# Patient Record
Sex: Male | Born: 1955 | Race: Black or African American | Hispanic: No | Marital: Married | Smoking: Never smoker
Health system: Southern US, Community
[De-identification: ages and names within clinical notes are randomized; demographics above are authoritative.]

## PROBLEM LIST (undated history)

## (undated) DIAGNOSIS — E119 Type 2 diabetes mellitus without complications: Secondary | ICD-10-CM

## (undated) DIAGNOSIS — K219 Gastro-esophageal reflux disease without esophagitis: Secondary | ICD-10-CM

## (undated) DIAGNOSIS — I639 Cerebral infarction, unspecified: Secondary | ICD-10-CM

## (undated) DIAGNOSIS — Z87442 Personal history of urinary calculi: Secondary | ICD-10-CM

## (undated) DIAGNOSIS — B019 Varicella without complication: Secondary | ICD-10-CM

## (undated) DIAGNOSIS — N529 Male erectile dysfunction, unspecified: Secondary | ICD-10-CM

## (undated) DIAGNOSIS — I10 Essential (primary) hypertension: Secondary | ICD-10-CM

## (undated) DIAGNOSIS — E7800 Pure hypercholesterolemia, unspecified: Secondary | ICD-10-CM

## (undated) HISTORY — DX: Varicella without complication: B01.9

## (undated) HISTORY — PX: WRIST FUSION: SHX839

## (undated) HISTORY — PX: SHOULDER SURGERY: SHX246

## (undated) HISTORY — PX: CLAVICLE SURGERY: SHX598

## (undated) HISTORY — DX: Type 2 diabetes mellitus without complications: E11.9

---

## 2001-09-29 ENCOUNTER — Ambulatory Visit (HOSPITAL_BASED_OUTPATIENT_CLINIC_OR_DEPARTMENT_OTHER): Admission: RE | Admit: 2001-09-29 | Discharge: 2001-09-29 | Payer: Self-pay | Admitting: Orthopedic Surgery

## 2006-12-15 ENCOUNTER — Encounter: Admission: RE | Admit: 2006-12-15 | Discharge: 2006-12-15 | Payer: Self-pay | Admitting: Gastroenterology

## 2007-08-07 ENCOUNTER — Inpatient Hospital Stay (HOSPITAL_COMMUNITY): Admission: EM | Admit: 2007-08-07 | Discharge: 2007-08-08 | Payer: Self-pay | Admitting: Emergency Medicine

## 2007-08-08 ENCOUNTER — Encounter (INDEPENDENT_AMBULATORY_CARE_PROVIDER_SITE_OTHER): Payer: Self-pay | Admitting: Gastroenterology

## 2010-09-22 DIAGNOSIS — I639 Cerebral infarction, unspecified: Secondary | ICD-10-CM

## 2010-09-22 HISTORY — DX: Cerebral infarction, unspecified: I63.9

## 2011-02-04 NOTE — H&P (Signed)
NAME:  Jesus Bell, Jesus Bell                ACCOUNT NO.:  [id number]   MEDICAL RECORD NO.:  [id number]          PATIENT TYPE:  EMS   LOCATION:  MAJO                         FACILITY:  [facility]   PHYSICIAN:  Deirdre Peer. Polite, M.D. DATE OF BIRTH:  08/02/1956   DATE OF ADMISSION:  08/07/2007  DATE OF DISCHARGE:                              HISTORY & PHYSICAL   PRIMARY CARE PHYSICIAN:  Georgann Housekeeper, M.D.   CHIEF COMPLAINT:  Nausea and vomiting.   HISTORY OF PRESENT ILLNESS:  A 55 year old male with a history of  hypertension, intolerant to several medications, who recently saw his  primary M.D. for abdominal pain and nausea.  The patient presents to the  ED with similar symptoms.  In the ED, the patient was evaluated.  CT  showed some duodenitis.  The patient has a history of daily aspirin use.  No other nonsteroidal anti-inflammatory drugs and no alcohol.  He stated  that 2 days ago his belly started hurting with subsequently multiple  bouts of nausea and vomiting.  His wife thinks that they may have seen  some flecks of blood.  There was no coffee ground emesis.  In the ED,  the patient was evaluated with studies as stated above.  Admission is  deemed necessary for evaluation and treatment for abdominal pain,  nausea, vomiting and CT showing duodenitis.   PAST MEDICAL HISTORY:  1. Hypertension.  2. Obesity.   CURRENT MEDICATIONS:  Aspirin 81 mg daily.   SOCIAL HISTORY:  Negative for tobacco, alcohol and drugs.   PAST SURGICAL HISTORY:  The patient has had bilateral hand and shoulder  surgery in the past.   ALLERGIES:  None.   FAMILY HISTORY:  He is unsure of his mother's or father's past medical  history.   REVIEW OF SYSTEMS:  As stated in the HPI.   PHYSICAL EXAMINATION:  GENERAL:  The patient is alert and oriented x3,  in no apparent distress.  HEENT:  Blood pressure 155/92, temperature 98.8, pulse 92, respiratory  rate 20, saturation 95%.  HEENT:  Unremarkable.  CHEST:  Clear  without rales or rhonchi.  CARDIOVASCULAR:  Regular rate and rhythm, S1 and S2.  No S3 appreciated.  ABDOMEN:  Slight hypergastric tenderness.  No mass appreciated.  Minimal  guarding.  No rebound tenderness.  No hepatosplenomegaly.  EXTREMITIES:  No edema.  NEUROLOGIC:  Nonfocal.   ASSESSMENT:  1. Nausea, vomiting.  2. Abdominal pain.  Please note duodena showing abnormalities as      stated above.  3. Mildly elevated lipase.  4. Hypertension.  5. Obesity.   RECOMMENDATIONS:  Recommend the patient be admitted to a medicine floor  bed.  The patient will be given IV fluids, IV PPI, started on clear  liquids.  Will have follow up amylase and lipase in the a.m.  The  patient may warrant inpatient GI evaluation for endoscopy.      Deirdre Peer. Polite, M.D.  Electronically Signed     RDP/MEDQ  D:  08/07/2007  T:  08/07/2007  Job:  [id number]

## 2011-02-04 NOTE — Op Note (Signed)
NAME:  DESTINY, TRICKEY NO.:  [id number]   MEDICAL RECORD NO.:  [id number]          PATIENT TYPE:  INP   LOCATION:  5739                         FACILITY:  [facility]   PHYSICIAN:  Petra Kuba, M.D.    DATE OF BIRTH:  [birth year]   DATE OF PROCEDURE:  08/08/2007  DATE OF DISCHARGE:                               OPERATIVE REPORT   PROCEDURE:  Esophagogastroduodenoscopy with biopsy.   INDICATIONS:  Patient with abnormal CT, abdominal pain.  Consent was  signed after risks, benefits, methods, options thoroughly discussed  prior to any sedation given.   MEDICINES USED:  Fentanyl 75 mcg, Versed 7.5 mg.   PROCEDURE:  The video endoscope was inserted by direct vision.  The  esophagus was normal.  He did have a tiny to small hiatal hernia.  Scope  passed into the stomach, advanced through a normal antrum and pylorus,  duodenal bulb had minimal bulbitis.  The scope was advanced around the C-  loop.  In the second portion of the duodenum was an obvious moderately  deep, moderately large ulcer with a flat yellow base. The scope was  inserted to the third part of the duodenum where some minimal  inflammation was seen but no other significant abnormalities.  Scope was  withdrawn back to the bulb.  No other additional findings were seen.  Scope was withdrawn back and retroflexed.  Cardia and fundus were  normal.  In the proximal stomach was a minimal amount of gastritis and  small greater curve polyp.  Two biopsies of the polyp, two of the  proximal gastritis were obtained, and two of the antrum were obtained to  rule out Helicobacter pylori as well.  The rest of the stump was  evaluated on straight and retroflex visualization.  No additional  findings were seen.  We went ahead and reinserted the scope into the  duodenum and based on this slightly atypical second portion of the  duodenum ulcer even though it had no worrisome stigmata, a few biopsies  of the edge of the ulcer were  obtained and put in a second container.  Scope was withdrawn back to the stomach which was reevaluated on  straight visualization.  No additional blood was seen.  Air was  suctioned, scope slowly withdrawn.  Again a good look at the esophagus  was normal.  Scope was withdrawn.  The patient tolerated the procedure  well.  There was no obvious immediate complication.   ENDOSCOPIC DIAGNOSES:  1. Tiny to small hiatal hernia.  2. Normal proximal gastritis status post biopsy.  3. Small greater curve polyp status post biopsy.  4. Minimal bulbitis and duodenitis.  5. Second portion of the duodenum, deep moderately sized duodenal      ulcer status post careful biopsy from the edge.  6. Otherwise within normal limits esophagogastroduodenoscopy status      post antral biopsies as well rule out Helicobacter.   PLAN:  Await pathology.  No aspirin or nonsteroidals, Tylenol only.  Pump inhibitors b.i.d. for a month and then decrease to once a day for a  while.  Possibly be able to discharge today.  We are going slowly  advance the diet.  Consider repeat endo in two to thee months to  document healing.  I will be happy to see him back p.r.n. or in a month  to recheck symptoms, make sure no further work-up plans are needed and  to decide to repeat endo or not.           ______________________________  Petra Kuba, M.D.     MEM/MEDQ  D:  08/08/2007  T:  08/09/2007  Job:  [id number]   cc:   Deirdre Peer. Nehemiah Settle, M.D.  Georgann Housekeeper, MD

## 2011-02-04 NOTE — Consult Note (Signed)
NAME:  Jesus Bell, Jesus Bell NO.:  [id number]   MEDICAL RECORD NO.:  [id number]          PATIENT TYPE:  INP   LOCATION:  5739                         FACILITY:  [facility]   PHYSICIAN:  Petra Kuba, M.D.    DATE OF BIRTH:  1956-02-01   DATE OF CONSULTATION:  08/08/2007  DATE OF DISCHARGE:                                 CONSULTATION   HISTORY:  Patient seen at the request of Dr. Nehemiah Settle for abdominal pain  that came on acutely 2 days ago.  CAT scan was pertinent for sub-  duodenal inflammation.  I reviewed that with the radiologist.  We were  consulted for a questionable endo.  He had no previous GI symptoms.  He  has been on an aspirin a day.  He had an uneventful colonoscopy without  findings or difficulty in the spring.   PAST MEDICAL HISTORY:  Pertinent for:  1. Hypertension.  2. Orthopedic surgery.   MEDICINES AT HOME:  Included aspirin only.   SOCIAL HISTORY:  Does not smoke or drink.  Minimizes other over-the-  counter medicines.   FAMILY HISTORY:  Negative for any obvious GI problems.   ALLERGIES:  NONE.   REVIEW OF SYSTEMS:  Pertinent for the pain is better, but he says the  pain medicine is helping.   PHYSICAL EXAMINATION:  GENERAL:  No acute distress.  Lying comfortably  in the bed.  NECK:  Supple without adenopathy.  LUNGS:  Clear.  HEART:  Regular rate and rhythm.  ABDOMEN:  Soft, nontender.   CT reviewed with the radiologist.   Labs pertinent for a normal white count, hemoglobin 10.10, with an MCV  of 75, platelet count normal.  Lipase initially was 92, repeats have  been normal.  Amylase is normal.  Chemistry is normal.   ASSESSMENT:  Abdominal pain, probably due to abnormal duodenum also  found on CAT scan.   PLAN:  The risks, benefits, methods of endoscopy were discussed.  Re-  compare to recent colonoscopy he already had.  We will proceed later  today if able.  If not, tomorrow.  Further workup and plans pending  those findings.          ______________________________  Petra Kuba, M.D.     MEM/MEDQ  D:  08/08/2007  T:  08/08/2007  Job:  [id number]   cc:   Deirdre Peer. Polite, M.D.

## 2011-02-07 NOTE — Discharge Summary (Signed)
NAME:  Jesus Bell, Jesus Bell NO.:  [id number]   MEDICAL RECORD NO.:  [id number]          PATIENT TYPE:  INP   LOCATION:  5739                         FACILITY:  [facility]   PHYSICIAN:  Georgann Housekeeper, MD      DATE OF BIRTH:  August 12, 1956   DATE OF ADMISSION:  08/07/2007  DATE OF DISCHARGE:  08/08/2007                               DISCHARGE SUMMARY   DISCHARGE DIAGNOSES:  Nausea, vomiting, abdominal pain with duodenitis  and hiatal hernia on esophagogastroduodenoscopy.   DISCHARGE MEDICATIONS:  Protonix 40 mg two tablets a day for one month,  then one a day.  No aspirin or NSAIDs.   CONSULTATIONS:  Gastroenterology.   PROCEDURE:  EGD.   HOSPITAL COURSE:  The patient is a 55 year old admitted with nausea,  vomiting, and abdominal pain.  A CT scan was negative.  His lab work was  unremarkable.  The patient was started on a clear liquids and a proton  pump inhibitor.  He had a GI consult and underwent an endoscopy which  showed some duodenitis and a hiatal hernia, no evidence of active GI  bleed, stomach polyps which were benign, and some duodenal ulcer.  The  patient's hemoglobin was stable.  The patient was put on Protonix twice  a day for one month and then once a day.  His abdominal pain resolved.   The patient will follow up outpatient with me and also with Dr. Ewing Schlein,  GI.      Georgann Housekeeper, MD  Electronically Signed     KH/MEDQ  D:  09/13/2007  T:  09/13/2007  Job:  [id number]

## 2011-02-07 NOTE — Discharge Summary (Signed)
NAME:  Jesus Bell, Jesus Bell                ACCOUNT NO.:  [id number]   MEDICAL RECORD NO.:  [id number]          PATIENT TYPE:  INP   LOCATION:  5739                         FACILITY:  [facility]   PHYSICIAN:  Deirdre Peer. Polite, M.D. DATE OF BIRTH:  May 11, 1956   DATE OF ADMISSION:  08/07/2007  DATE OF DISCHARGE:  08/08/2007                               DISCHARGE SUMMARY   DISCHARGE DIAGNOSES:  1. Duodenal ulcer status post biopsy for outpatient follow up with      Gastroenterology to continue twice daily proton pump inhibitor      (PPI) times 1 month.  2. Nausea and vomiting resolved.  3. Abdominal pain.  4. History of hypertension.   DISCHARGE MEDICATIONS:  1. Patient is not to take aspirin.  2. B.i.d. proton pump inhibitor, Protonix 40 mg.   DISPOSITION:  Discharged home in stable condition, asked to follow-up  with primary MD in approximately two weeks.   HISTORY OF PRESENT ILLNESS:  A 55 year old male presented to the ED with  complaints of abdominal pain, nausea, vomiting. He had an abnormal CT  showing duodenitis. Admission was deemed necessary for further  evaluation and treatment. Please see dictated H and P for further  details.   PAST MEDICAL HISTORY:  Significant for hypertension and obesity.   ADMISSION MEDICATIONS:  Include aspirin.   SOCIAL HISTORY:  Negative for tobacco, alcohol or drugs.   HOSPITAL COURSE:  Patient was admitted to the medicine floor for  evaluation and treatment of nausea, vomiting, abdominal pain. Patient  was placed on IV fluids, kept n.p.o., provided analgesia as needed. The  patient was seen in consultation by GI the following day, had an EGD  which showed duodenal ulcer. The patient was felt stable for discharge  by GI therefore he was discharged the following day. He was discharged  home on b.i.d. PPI and was asked to follow-up with primary MD as well as  GI.      Deirdre Peer. Polite, M.D.  Electronically Signed    RDP/MEDQ  D:  09/07/2007  T:   09/08/2007  Job:  [id number]   cc:   Georgann Housekeeper, MD

## 2011-02-07 NOTE — H&P (Signed)
[place]  Patient:    Jesus Bell, Jesus Bell Visit Number: [id number] MRN: [id number]          Service Type: DSU Location: [facility] Attending Physician:  Marlowe Shores Dictated by:   Artist Pais Mina Marble, M.D. 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  PROCEDURE: 1. Right wrist arthroscopy with debridement of TFCC tear. 2. Right wrist scaphoid excision and ulnar four-bone fusion using Acumed    hubcap, plate, and screws.  SURGEON:  Artist Pais. Mina Marble, M.D.  ASSISTANT:  Junius Roads. Ireton, P.A.C.  ANESTHESIA:  General.  TOURNIQUET TIME:  One hour 32 minutes.  COMPLICATIONS:  None.  DRAINS:  None.  DESCRIPTION:  Patient was taken to the operating room where after the induction of adequate general anesthesia the right upper extremity was prepped and draped in usual sterile fashion.  An Esmarch was used to exsanguinate the limb.  The tourniquet was inflated to 250 mmHg.  At this point in time, the patients right upper extremity was placed well-padded in the Concept wrist traction tower and 12 pounds of countertraction was placed across the radiocarpal joint.  A standard 3-4 arthroscopic portal was established 1 cm distal to the Listers tubercle.  The skin was incised in this area.  Blunt dissection was carried down to the capsule and the capsule was pierced.  The scope was placed inside the joint.  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  Ulnar view revealed a TFCC central degenrative tear  and significant synovitis.  An 18-gauge needle was then used to establish a 6U outflow portal followed by a 4-5 working portal established under direct vision.  Once this was done, an ulnar and radial side synovectomy was performed using the 2.9 suction shaver and the 1.5 mm microablation wand. After this was done the TFCC was debrided using the same two instruments. After this was done the instruments were removed from the wrist, the patients wrist was taken out of the Concept wrist traction tower, and this was then followed by an incision of the skin incorporating the 3-4 portal in the midline.  The skin was incised and dissection was carried down through the skin and subcutaneous tissues, flaps were raised accordingly, and branches of the superficial radial nerve were carefully identified and retracted.  At this point in time the interval between the third and fourth dorsal compartments was identified, the UPL tendon was removed from its fibroosseous sheath, and transposed radially.  A dissection was then carried out to move this fourth compartment ulnarly and a capsular incision was made with the proximal limb starting over the mid portion of the radius going toward the lunotriquetral joint, and then out toward the STT joint, incorporating the dorsal radiocarpal and dorsal intercarpal ligaments, and a large radially base flap was elevated. At this point in time the scaphoid was removed in piecemeal, using a combination of rongeurs and osteotomes.  Next, the 4-corner area was debrided of a significant amount of synovitis and using the Acumed hubcap system the 4-corner  was reamed down to a stable rim followed by decortication of the articular surfaces and placement of the hubcap plate.  It was secured to the lunate, the triquetrium, the hamate, and the capitate using seven screws under direct vision.  After this was done the wound was thoroughly irrigated.  The capsule was closed with 4-0  Mersilene, the retinaculum repaired with 4-0 Vicryl, and the skin with a running 3-0 Prolene subcuticular stitch. Steri-Strips, 4x4s, fluffs, and a compressive dressing were applied.  The patient also had Marcaine injected for postoperative pain control.  The patient tolerated the procedure well, went to recovery room in stable fashion. Dictated by:   Artist Pais Mina Marble, M.D. Attending Physician:  Marlowe Shores DD:  09/29/01 TD:  09/29/01 Job: [id number]

## 2011-07-01 LAB — COMPREHENSIVE METABOLIC PANEL
ALT: 18
AST: 31
Alkaline Phosphatase: 103
Chloride: 100
GFR calc non Af Amer: >60
Glucose, Bld: 105 — ABNORMAL HIGH
Potassium: 3.6
Sodium: 137
Total Protein: 7.2

## 2011-07-01 LAB — CBC
HCT: 27.1 — ABNORMAL LOW
Hemoglobin: 10.7 — ABNORMAL LOW
Hemoglobin: 9 — ABNORMAL LOW
MCHC: 33.3
MCHC: 33.6
MCV: 75.7 — ABNORMAL LOW
MCV: 76.3 — ABNORMAL LOW
RBC: 3.55 — ABNORMAL LOW
RBC: 4.2 — ABNORMAL LOW
RDW: 17.1 — ABNORMAL HIGH
RDW: 17.2 — ABNORMAL HIGH
WBC: 9.8

## 2011-07-01 LAB — IRON AND TIBC
Iron: 21 — ABNORMAL LOW
UIBC: 312

## 2011-07-01 LAB — DIFFERENTIAL
Lymphocytes Relative: 15
Lymphs Abs: 1.5
Neutrophils Relative %: 75

## 2011-07-01 LAB — BASIC METABOLIC PANEL
BUN: 6
Calcium: 8.2 — ABNORMAL LOW
Sodium: 137

## 2011-07-01 LAB — URINALYSIS, ROUTINE W REFLEX MICROSCOPIC
Bilirubin Urine: NEGATIVE
Hgb urine dipstick: NEGATIVE
Protein, ur: 30 — AB

## 2011-07-01 LAB — AMYLASE: Amylase: 43

## 2011-07-01 LAB — LIPASE, BLOOD: Lipase: 17

## 2011-08-31 ENCOUNTER — Ambulatory Visit: Payer: Self-pay

## 2011-09-22 ENCOUNTER — Emergency Department: Payer: Self-pay | Admitting: *Deleted

## 2011-09-23 LAB — URINALYSIS, COMPLETE
Glucose,UR: NEGATIVE mg/dL (ref 0–75)
Nitrite: NEGATIVE
Ph: 5 (ref 4.5–8.0)
Protein: 30
Specific Gravity: 1.024 (ref 1.003–1.030)
WBC UR: 2 /HPF (ref 0–5)

## 2011-10-01 ENCOUNTER — Other Ambulatory Visit: Payer: Self-pay | Admitting: Internal Medicine

## 2011-10-01 DIAGNOSIS — R42 Dizziness and giddiness: Secondary | ICD-10-CM

## 2011-10-02 ENCOUNTER — Ambulatory Visit
Admission: RE | Admit: 2011-10-02 | Discharge: 2011-10-02 | Disposition: A | Discharge status: Home / Self Care | Payer: Medicare Other | Source: Ambulatory Visit | Attending: Internal Medicine | Admitting: Internal Medicine

## 2011-10-02 DIAGNOSIS — R42 Dizziness and giddiness: Secondary | ICD-10-CM

## 2011-10-02 MED ORDER — GADOBENATE DIMEGLUMINE 529 MG/ML IV SOLN
20.0000 mL | Freq: Once | INTRAVENOUS | Status: AC | PRN
  Administered 2011-10-02: 20 mL via INTRAVENOUS

## 2011-10-06 ENCOUNTER — Other Ambulatory Visit: Payer: Self-pay | Admitting: Internal Medicine

## 2011-10-06 DIAGNOSIS — I6200 Nontraumatic subdural hemorrhage, unspecified: Secondary | ICD-10-CM

## 2011-11-04 ENCOUNTER — Ambulatory Visit
Admission: RE | Admit: 2011-11-04 | Discharge: 2011-11-04 | Disposition: A | Discharge status: Home / Self Care | Payer: Medicare Other | Source: Ambulatory Visit | Attending: Internal Medicine | Admitting: Internal Medicine

## 2011-11-04 ENCOUNTER — Other Ambulatory Visit: Payer: Medicare Other

## 2011-11-04 DIAGNOSIS — I6200 Nontraumatic subdural hemorrhage, unspecified: Secondary | ICD-10-CM

## 2014-09-28 DIAGNOSIS — J301 Allergic rhinitis due to pollen: Secondary | ICD-10-CM | POA: Diagnosis not present

## 2015-01-24 DIAGNOSIS — E780 Pure hypercholesterolemia: Secondary | ICD-10-CM | POA: Diagnosis not present

## 2015-01-24 DIAGNOSIS — I10 Essential (primary) hypertension: Secondary | ICD-10-CM | POA: Diagnosis not present

## 2015-01-24 DIAGNOSIS — K269 Duodenal ulcer, unspecified as acute or chronic, without hemorrhage or perforation: Secondary | ICD-10-CM | POA: Diagnosis not present

## 2015-01-24 DIAGNOSIS — Z1389 Encounter for screening for other disorder: Secondary | ICD-10-CM | POA: Diagnosis not present

## 2015-01-24 DIAGNOSIS — E119 Type 2 diabetes mellitus without complications: Secondary | ICD-10-CM | POA: Diagnosis not present

## 2015-07-27 DIAGNOSIS — E119 Type 2 diabetes mellitus without complications: Secondary | ICD-10-CM | POA: Diagnosis not present

## 2015-07-27 DIAGNOSIS — K219 Gastro-esophageal reflux disease without esophagitis: Secondary | ICD-10-CM | POA: Diagnosis not present

## 2015-07-27 DIAGNOSIS — N400 Enlarged prostate without lower urinary tract symptoms: Secondary | ICD-10-CM | POA: Diagnosis not present

## 2015-07-27 DIAGNOSIS — E7800 Pure hypercholesterolemia, unspecified: Secondary | ICD-10-CM | POA: Diagnosis not present

## 2015-07-27 DIAGNOSIS — Z1389 Encounter for screening for other disorder: Secondary | ICD-10-CM | POA: Diagnosis not present

## 2015-07-27 DIAGNOSIS — I10 Essential (primary) hypertension: Secondary | ICD-10-CM | POA: Diagnosis not present

## 2015-07-27 DIAGNOSIS — K269 Duodenal ulcer, unspecified as acute or chronic, without hemorrhage or perforation: Secondary | ICD-10-CM | POA: Diagnosis not present

## 2015-07-27 DIAGNOSIS — Z0000 Encounter for general adult medical examination without abnormal findings: Secondary | ICD-10-CM | POA: Diagnosis not present

## 2015-10-03 DIAGNOSIS — E119 Type 2 diabetes mellitus without complications: Secondary | ICD-10-CM | POA: Diagnosis not present

## 2015-11-15 DIAGNOSIS — S76012A Strain of muscle, fascia and tendon of left hip, initial encounter: Secondary | ICD-10-CM | POA: Diagnosis not present

## 2015-11-30 DIAGNOSIS — K921 Melena: Secondary | ICD-10-CM | POA: Diagnosis not present

## 2016-02-01 DIAGNOSIS — E7800 Pure hypercholesterolemia, unspecified: Secondary | ICD-10-CM | POA: Diagnosis not present

## 2016-02-01 DIAGNOSIS — I10 Essential (primary) hypertension: Secondary | ICD-10-CM | POA: Diagnosis not present

## 2016-02-01 DIAGNOSIS — E119 Type 2 diabetes mellitus without complications: Secondary | ICD-10-CM | POA: Diagnosis not present

## 2016-02-01 DIAGNOSIS — K219 Gastro-esophageal reflux disease without esophagitis: Secondary | ICD-10-CM | POA: Diagnosis not present

## 2016-03-23 ENCOUNTER — Emergency Department
Admission: EM | Admit: 2016-03-23 | Discharge: 2016-03-23 | Disposition: A | Discharge status: Home / Self Care | Payer: Commercial Managed Care - HMO | Attending: Emergency Medicine | Admitting: Emergency Medicine

## 2016-03-23 ENCOUNTER — Emergency Department: Payer: Commercial Managed Care - HMO

## 2016-03-23 ENCOUNTER — Encounter: Payer: Self-pay | Admitting: Emergency Medicine

## 2016-03-23 DIAGNOSIS — E119 Type 2 diabetes mellitus without complications: Secondary | ICD-10-CM | POA: Diagnosis not present

## 2016-03-23 DIAGNOSIS — M25552 Pain in left hip: Secondary | ICD-10-CM

## 2016-03-23 DIAGNOSIS — I10 Essential (primary) hypertension: Secondary | ICD-10-CM | POA: Insufficient documentation

## 2016-03-23 DIAGNOSIS — M1612 Unilateral primary osteoarthritis, left hip: Secondary | ICD-10-CM | POA: Diagnosis not present

## 2016-03-23 HISTORY — DX: Essential (primary) hypertension: I10

## 2016-03-23 HISTORY — DX: Type 2 diabetes mellitus without complications: E11.9

## 2016-03-23 MED ORDER — OXYCODONE-ACETAMINOPHEN 5-325 MG PO TABS: 1.0000 | ORAL_TABLET | ORAL | Status: DC | PRN

## 2016-03-23 MED ORDER — OXYCODONE-ACETAMINOPHEN 5-325 MG PO TABS
2.0000 | ORAL_TABLET | Freq: Once | ORAL | Status: AC
  Administered 2016-03-23: 2 via ORAL
  Filled 2016-03-23: qty 2

## 2016-03-23 MED ORDER — MELOXICAM 15 MG PO TABS: 15.0000 mg | ORAL_TABLET | Freq: Every day | ORAL | Status: DC

## 2016-03-23 NOTE — Discharge Instructions (Signed)
Take medication only as directed. The aware that you may not take the narcotic Percocet when you are driving as this may cause increased drowsiness. This may also cause increased risk of falling. Meloxicam once daily with food for arthritis. Follow-up with your doctor or Dr. Roland Rack who is the orthopedist on call today.

## 2016-03-23 NOTE — ED Notes (Signed)
C/O intermittent left leg pain for "a while".  States yesterday morning pain worsened.

## 2016-03-23 NOTE — ED Notes (Signed)
Pt states he has had pain in his side near his hip that has been hurting off and on for the past few weeks.  Pt denies any injury. Pt is able to walk but c/o increased pain.

## 2016-03-23 NOTE — ED Provider Notes (Signed)
[facility] Emergency Department Provider Note   ____________________________________________  Time seen: Approximately 10:01 AM  I have reviewed the triage vital signs and the nursing notes.   HISTORY  Chief Complaint Leg Pain   HPI Jesus Bell is a 60 y.o. male is here with complaint of left hip pain and hurting intermittently for the last several weeks. Patient is unaware of any injury to his hip area but states that he has increased pain with walking or standing. Patient has been taking ibuprofen without any relief. Patient states that this morning he had more trouble getting out of bed and decided to come to the emergency room for evaluation. Patient currently rates his pain as 6/10.   Past Medical History  Diagnosis Date  . Hypertension   . Diabetes mellitus without complication ([place])     boarderline    There are no active problems to display for this patient.   No past surgical history on file.  Current Outpatient Rx  Name  Route  Sig  Dispense  Refill  . meloxicam (MOBIC) 15 MG tablet   Oral   Take 1 tablet (15 mg total) by mouth daily.   30 tablet   2   . oxyCODONE-acetaminophen (PERCOCET) 5-325 MG tablet   Oral   Take 1 tablet by mouth every 4 (four) hours as needed for severe pain.   20 tablet   0     Allergies Review of patient's allergies indicates no known allergies.  No family history on file.  Social History Social History  Substance Use Topics  . Smoking status: Never Smoker   . Smokeless tobacco: Not on file  . Alcohol Use: No    Review of Systems Constitutional: No fever/chills Cardiovascular: Denies chest pain. Respiratory: Denies shortness of breath. Gastrointestinal: No abdominal pain.  No nausea, no vomiting.  Musculoskeletal: Positive for left hip pain. Negative for back pain. Skin: Negative for rash. Neurological: Negative for headaches, focal weakness or numbness.  10-point ROS otherwise  negative.  ____________________________________________   PHYSICAL EXAM:  VITAL SIGNS: ED Triage Vitals  Enc Vitals Group     BP 03/23/16 0921 150/82 mmHg     Pulse Rate 03/23/16 0921 86     Resp 03/23/16 0921 16     Temp 03/23/16 0921 98.1 F (36.7 C)     Temp Source 03/23/16 0921 Oral     SpO2 03/23/16 0921 96 %     Weight 03/23/16 0921 240 lb (108.863 kg)     Height 03/23/16 0921 5\' 8"  (1.727 m)     Head Cir --      Peak Flow --      Pain Score 03/23/16 0920 6     Pain Loc --      Pain Edu? --      Excl. in [place]? --     Constitutional: Alert and oriented. Well appearing and in no acute distress. Eyes: Conjunctivae are normal. PERRL. EOMI. Head: Atraumatic. Nose: No congestion/rhinnorhea. Neck: No stridor.   Cardiovascular: Normal rate, regular rhythm. Grossly normal heart sounds.  Good peripheral circulation. Respiratory: Normal respiratory effort.  No retractions. Lungs CTAB. Gastrointestinal: Soft and nontender. No distention.  Musculoskeletal: Left hip on examination there is no gross deformity. There is moderate tenderness on palpation of the lateral and posterior portion of the left hip. Patient is able to move slowly with moderate discomfort. Patient is able to stand without any assistance. Neurologic:  Normal speech and language. No  gross focal neurologic deficits are appreciated. Gait was not tested secondary to patient's pain. Skin:  Skin is warm, dry and intact. No rash noted. Psychiatric: Mood and affect are normal. Speech and behavior are normal.  ____________________________________________   LABS (all labs ordered are listed, but only abnormal results are displayed)  Labs Reviewed - No data to display ____________________________________________   RADIOLOGY  Left hip x-ray per radiologist shows moderate degenerative changes of the left hip joint. I, Johnn Hai, personally viewed and evaluated these images (plain radiographs) as part of my  medical decision making, as well as reviewing the written report by the radiologist. ____________________________________________   PROCEDURES  Procedure(s) performed: None  Critical Care performed: No  ____________________________________________   INITIAL IMPRESSION / ASSESSMENT AND PLAN / ED COURSE  Pertinent labs & imaging results that were available during my care of the patient were reviewed by me and considered in my medical decision making (see chart for details).  Patient is given prescription for meloxicam 15 mg one daily with food and Percocet as needed for pain. Currently patient is able stand in the room with decreased pain after being given Percocet while in the emergency room. Patient is follow-up with Dr. Roland Rack if any continued problems with his hip. ____________________________________________   FINAL CLINICAL IMPRESSION(S) / ED DIAGNOSES  Final diagnoses:  Hip pain, acute, left  Osteoarthritis of left hip, unspecified osteoarthritis type      NEW MEDICATIONS STARTED DURING THIS VISIT:  Discharge Medication List as of 03/23/2016 12:12 PM    START taking these medications   Details  meloxicam (MOBIC) 15 MG tablet Take 1 tablet (15 mg total) by mouth daily., Starting 03/23/2016, Until Discontinued, Print    oxyCODONE-acetaminophen (PERCOCET) 5-325 MG tablet Take 1 tablet by mouth every 4 (four) hours as needed for severe pain., Starting 03/23/2016, Until Discontinued, Print         Note:  This document was prepared using Dragon voice recognition software and may include unintentional dictation errors.    Johnn Hai, PA-C 03/23/16 1338  Lisa Roca, MD 03/23/16 1344

## 2016-06-06 DIAGNOSIS — J988 Other specified respiratory disorders: Secondary | ICD-10-CM | POA: Diagnosis not present

## 2016-06-09 DIAGNOSIS — J301 Allergic rhinitis due to pollen: Secondary | ICD-10-CM | POA: Diagnosis not present

## 2016-06-09 DIAGNOSIS — Z23 Encounter for immunization: Secondary | ICD-10-CM | POA: Diagnosis not present

## 2016-08-08 DIAGNOSIS — N529 Male erectile dysfunction, unspecified: Secondary | ICD-10-CM | POA: Diagnosis not present

## 2016-08-08 DIAGNOSIS — E291 Testicular hypofunction: Secondary | ICD-10-CM | POA: Diagnosis not present

## 2016-08-08 DIAGNOSIS — Z23 Encounter for immunization: Secondary | ICD-10-CM | POA: Diagnosis not present

## 2016-08-08 DIAGNOSIS — I10 Essential (primary) hypertension: Secondary | ICD-10-CM | POA: Diagnosis not present

## 2016-08-08 DIAGNOSIS — Z0000 Encounter for general adult medical examination without abnormal findings: Secondary | ICD-10-CM | POA: Diagnosis not present

## 2016-08-08 DIAGNOSIS — E7800 Pure hypercholesterolemia, unspecified: Secondary | ICD-10-CM | POA: Diagnosis not present

## 2016-08-08 DIAGNOSIS — E669 Obesity, unspecified: Secondary | ICD-10-CM | POA: Diagnosis not present

## 2016-08-08 DIAGNOSIS — K269 Duodenal ulcer, unspecified as acute or chronic, without hemorrhage or perforation: Secondary | ICD-10-CM | POA: Diagnosis not present

## 2016-08-08 DIAGNOSIS — Z1159 Encounter for screening for other viral diseases: Secondary | ICD-10-CM | POA: Diagnosis not present

## 2016-08-08 DIAGNOSIS — Z1389 Encounter for screening for other disorder: Secondary | ICD-10-CM | POA: Diagnosis not present

## 2016-08-08 DIAGNOSIS — N400 Enlarged prostate without lower urinary tract symptoms: Secondary | ICD-10-CM | POA: Diagnosis not present

## 2016-08-08 DIAGNOSIS — E119 Type 2 diabetes mellitus without complications: Secondary | ICD-10-CM | POA: Diagnosis not present

## 2016-08-08 DIAGNOSIS — Z6837 Body mass index (BMI) 37.0-37.9, adult: Secondary | ICD-10-CM | POA: Diagnosis not present

## 2016-09-11 ENCOUNTER — Ambulatory Visit: Payer: Medicare Other | Admitting: Skilled Nursing Facility1

## 2016-10-22 DIAGNOSIS — E119 Type 2 diabetes mellitus without complications: Secondary | ICD-10-CM | POA: Diagnosis not present

## 2017-02-11 DIAGNOSIS — I10 Essential (primary) hypertension: Secondary | ICD-10-CM | POA: Diagnosis not present

## 2017-02-11 DIAGNOSIS — M79605 Pain in left leg: Secondary | ICD-10-CM | POA: Diagnosis not present

## 2017-02-11 DIAGNOSIS — K219 Gastro-esophageal reflux disease without esophagitis: Secondary | ICD-10-CM | POA: Diagnosis not present

## 2017-02-11 DIAGNOSIS — E119 Type 2 diabetes mellitus without complications: Secondary | ICD-10-CM | POA: Diagnosis not present

## 2017-02-11 DIAGNOSIS — Z7984 Long term (current) use of oral hypoglycemic drugs: Secondary | ICD-10-CM | POA: Diagnosis not present

## 2017-02-11 DIAGNOSIS — Z1211 Encounter for screening for malignant neoplasm of colon: Secondary | ICD-10-CM | POA: Diagnosis not present

## 2017-02-11 DIAGNOSIS — E7800 Pure hypercholesterolemia, unspecified: Secondary | ICD-10-CM | POA: Diagnosis not present

## 2017-03-17 DIAGNOSIS — M1612 Unilateral primary osteoarthritis, left hip: Secondary | ICD-10-CM | POA: Diagnosis not present

## 2017-03-17 DIAGNOSIS — M545 Low back pain: Secondary | ICD-10-CM | POA: Diagnosis not present

## 2017-03-17 DIAGNOSIS — M25552 Pain in left hip: Secondary | ICD-10-CM | POA: Diagnosis not present

## 2017-04-27 DIAGNOSIS — Z1211 Encounter for screening for malignant neoplasm of colon: Secondary | ICD-10-CM | POA: Diagnosis not present

## 2017-04-27 DIAGNOSIS — K648 Other hemorrhoids: Secondary | ICD-10-CM | POA: Diagnosis not present

## 2017-04-28 DIAGNOSIS — M1612 Unilateral primary osteoarthritis, left hip: Secondary | ICD-10-CM | POA: Diagnosis not present

## 2017-05-03 DIAGNOSIS — M1612 Unilateral primary osteoarthritis, left hip: Secondary | ICD-10-CM | POA: Insufficient documentation

## 2017-06-13 DIAGNOSIS — J0140 Acute pansinusitis, unspecified: Secondary | ICD-10-CM | POA: Diagnosis not present

## 2017-08-04 DIAGNOSIS — M25552 Pain in left hip: Secondary | ICD-10-CM | POA: Diagnosis not present

## 2017-08-05 ENCOUNTER — Inpatient Hospital Stay
Admission: RE | Admit: 2017-08-05 | Discharge: 2017-08-05 | Disposition: A | Discharge status: Home / Self Care | Payer: Medicare Other | Source: Ambulatory Visit

## 2017-08-06 ENCOUNTER — Other Ambulatory Visit: Payer: Self-pay

## 2017-08-06 ENCOUNTER — Encounter
Admission: RE | Admit: 2017-08-06 | Discharge: 2017-08-06 | Disposition: A | Discharge status: Home / Self Care | Payer: Medicare HMO | Source: Ambulatory Visit | Attending: Orthopedic Surgery | Admitting: Orthopedic Surgery

## 2017-08-06 DIAGNOSIS — I10 Essential (primary) hypertension: Secondary | ICD-10-CM | POA: Diagnosis not present

## 2017-08-06 DIAGNOSIS — N529 Male erectile dysfunction, unspecified: Secondary | ICD-10-CM | POA: Diagnosis not present

## 2017-08-06 DIAGNOSIS — K219 Gastro-esophageal reflux disease without esophagitis: Secondary | ICD-10-CM | POA: Diagnosis not present

## 2017-08-06 DIAGNOSIS — Z01810 Encounter for preprocedural cardiovascular examination: Secondary | ICD-10-CM | POA: Diagnosis not present

## 2017-08-06 DIAGNOSIS — Z23 Encounter for immunization: Secondary | ICD-10-CM | POA: Diagnosis not present

## 2017-08-06 DIAGNOSIS — E119 Type 2 diabetes mellitus without complications: Secondary | ICD-10-CM | POA: Insufficient documentation

## 2017-08-06 DIAGNOSIS — Z01812 Encounter for preprocedural laboratory examination: Secondary | ICD-10-CM | POA: Insufficient documentation

## 2017-08-06 DIAGNOSIS — E291 Testicular hypofunction: Secondary | ICD-10-CM | POA: Diagnosis not present

## 2017-08-06 DIAGNOSIS — N400 Enlarged prostate without lower urinary tract symptoms: Secondary | ICD-10-CM | POA: Diagnosis not present

## 2017-08-06 DIAGNOSIS — Z1389 Encounter for screening for other disorder: Secondary | ICD-10-CM | POA: Diagnosis not present

## 2017-08-06 DIAGNOSIS — Z7189 Other specified counseling: Secondary | ICD-10-CM | POA: Diagnosis not present

## 2017-08-06 DIAGNOSIS — K269 Duodenal ulcer, unspecified as acute or chronic, without hemorrhage or perforation: Secondary | ICD-10-CM | POA: Diagnosis not present

## 2017-08-06 DIAGNOSIS — Z0000 Encounter for general adult medical examination without abnormal findings: Secondary | ICD-10-CM | POA: Diagnosis not present

## 2017-08-06 DIAGNOSIS — E1129 Type 2 diabetes mellitus with other diabetic kidney complication: Secondary | ICD-10-CM | POA: Diagnosis not present

## 2017-08-06 DIAGNOSIS — E7800 Pure hypercholesterolemia, unspecified: Secondary | ICD-10-CM | POA: Diagnosis not present

## 2017-08-06 HISTORY — DX: Cerebral infarction, unspecified: I63.9

## 2017-08-06 HISTORY — DX: Gastro-esophageal reflux disease without esophagitis: K21.9

## 2017-08-06 LAB — COMPREHENSIVE METABOLIC PANEL
ALBUMIN: 4.2 g/dL (ref 3.5–5.0)
ALT: 24 U/L (ref 17–63)
ANION GAP: 9 (ref 5–15)
AST: 26 U/L (ref 15–41)
Alkaline Phosphatase: 105 U/L (ref 38–126)
BILIRUBIN TOTAL: 0.6 mg/dL (ref 0.3–1.2)
BUN: 10 mg/dL (ref 6–20)
CALCIUM: 9.1 mg/dL (ref 8.9–10.3)
CO2: 27 mmol/L (ref 22–32)
Chloride: 103 mmol/L (ref 101–111)
Creatinine, Ser: 0.99 mg/dL (ref 0.61–1.24)
GFR calc non Af Amer: >60 mL/min (ref >60)
GLUCOSE: 212 mg/dL — AB (ref 65–99)
POTASSIUM: 3.3 mmol/L — AB (ref 3.5–5.1)
SODIUM: 139 mmol/L (ref 135–145)
TOTAL PROTEIN: 7.3 g/dL (ref 6.5–8.1)

## 2017-08-06 LAB — CBC WITH DIFFERENTIAL/PLATELET
BASOS PCT: 1 %
Basophils Absolute: 0 10*3/uL (ref 0–0.1)
EOS ABS: 0.2 10*3/uL (ref 0–0.7)
Eosinophils Relative: 4 %
HEMATOCRIT: 40.9 % (ref 40.0–52.0)
Hemoglobin: 13.7 g/dL (ref 13.0–18.0)
LYMPHS ABS: 1.2 10*3/uL (ref 1.0–3.6)
Lymphocytes Relative: 22 %
MCH: 25.5 pg — AB (ref 26.0–34.0)
MCHC: 33.5 g/dL (ref 32.0–36.0)
MCV: 76.2 fL — ABNORMAL LOW (ref 80.0–100.0)
MONO ABS: 0.6 10*3/uL (ref 0.2–1.0)
MONOS PCT: 11 %
Neutro Abs: 3.4 10*3/uL (ref 1.4–6.5)
Neutrophils Relative %: 62 %
Platelets: 292 10*3/uL (ref 150–440)
RBC: 5.36 MIL/uL (ref 4.40–5.90)
RDW: 16.4 % — AB (ref 11.5–14.5)
WBC: 5.4 10*3/uL (ref 3.8–10.6)

## 2017-08-06 LAB — URINALYSIS, ROUTINE W REFLEX MICROSCOPIC
Bilirubin Urine: NEGATIVE
Glucose, UA: >=500 mg/dL — AB
HGB URINE DIPSTICK: NEGATIVE
KETONES UR: NEGATIVE mg/dL
Leukocytes, UA: NEGATIVE
NITRITE: NEGATIVE
PROTEIN: 30 mg/dL — AB
RBC / HPF: NONE SEEN RBC/hpf (ref 0–5)
Specific Gravity, Urine: 1.014 (ref 1.005–1.030)
Squamous Epithelial / LPF: NONE SEEN
pH: 6 (ref 5.0–8.0)

## 2017-08-06 LAB — SURGICAL PCR SCREEN
MRSA, PCR: NEGATIVE
STAPHYLOCOCCUS AUREUS: NEGATIVE

## 2017-08-06 LAB — C-REACTIVE PROTEIN: CRP: 1.9 mg/dL — ABNORMAL HIGH (ref <1.0)

## 2017-08-06 LAB — TYPE AND SCREEN
ABO/RH(D): A POS
ANTIBODY SCREEN: NEGATIVE

## 2017-08-06 LAB — PROTIME-INR
INR: 1.01
Prothrombin Time: 13.2 seconds (ref 11.4–15.2)

## 2017-08-06 LAB — HEMOGLOBIN A1C
HEMOGLOBIN A1C: 7.4 % — AB (ref 4.8–5.6)
MEAN PLASMA GLUCOSE: 165.68 mg/dL

## 2017-08-06 LAB — SEDIMENTATION RATE: Sed Rate: 13 mm/hr (ref 0–20)

## 2017-08-06 LAB — APTT: aPTT: 33 seconds (ref 24–36)

## 2017-08-06 NOTE — Patient Instructions (Signed)
Your procedure is scheduled on: Monday 08/17/17 Report to [facility]. 2ND FLOOR MEDICAL MALL ENTRANCE. To find out your arrival time please call [phone] between 1PM - 3PM on Friday 08/14/17.  Remember: Instructions that are not followed completely may result in serious medical risk, up to and including death, or upon the discretion of your surgeon and anesthesiologist your surgery may need to be rescheduled.    __X__ 1. Do not eat anything after midnight the night before your    procedure.  No gum chewing or hard candies.  You may drink clear   liquids up to 2 hours before you are scheduled to arrive at the   hospital for your procedure. Do not drink clear liquids within 2   hours of scheduled arrival to the hospital as this may lead to your   procedure being delayed or rescheduled.       Clear liquids include:   Water or Apple juice without pulp   Clear carbohydrate beverage such as Clearfast or Gatorade   Black coffee or Clear Tea (no milk, no creamer, do not add anything   to the coffee or tea)    Diabetics should only drink water   __X__ 2. No Alcohol for 24 hours before or after surgery.   ____ 3. Bring all medications with you on the day of surgery if instructed.    __X__ 4. Notify your doctor if there is any change in your medical condition     (cold, fever, infections).             __X___5. No smoking within 24 hours of your surgery.     Do not wear jewelry, make-up, hairpins, clips or nail polish.  Do not wear lotions, powders, or perfumes.   Do not shave 48 hours prior to surgery. Men may shave face and neck.  Do not bring valuables to the hospital.    [facility] is not responsible for any belongings or valuables.               Contacts, dentures or bridgework may not be worn into surgery.  Leave your suitcase in the car. After surgery it may be brought to your room.  For patients admitted to the hospital, discharge time is determined by your                 treatment team.   Patients discharged the day of surgery will not be allowed to drive home.   Please read over the following fact sheets that you were given:   MRSA Information   __X__ Take these medicines the morning of surgery with A SIP OF WATER:    1. AMLODIPINE  2. LOVASTATIN  3. OMEPRAZOLE  4.  5.  6.  ____ Fleet Enema (as directed)   __X__ Use CHG Soap/SAGE wipes as directed  ____ Use inhalers on the day of surgery  __X__ Stop metformin 2 days prior to surgery    ____ Take 1/2 of usual insulin dose the night before surgery and none on the morning of surgery.   ____ Stop Coumadin/Plavix/aspirin on   __X__ Stop Anti-inflammatories such as Advil, Aleve, Ibuprofen, Motrin, Naproxen, Naprosyn, Goodies,powder, or aspirin products.  OK to take Tylenol.   __X__ Stop supplements, Vitamin E, Fish Oil until after surgery.    ____ Bring C-Pap to the hospital.

## 2017-08-07 LAB — URINE CULTURE: Culture: NO GROWTH

## 2017-08-07 MED ORDER — FAMOTIDINE 20 MG PO TABS: 20.0000 mg | ORAL_TABLET | Freq: Once | ORAL | Status: DC

## 2017-08-07 NOTE — Pre-Procedure Instructions (Signed)
CRP and hgbA1C sent to Dr. Marry Guan for review.

## 2017-08-08 NOTE — Pre-Procedure Instructions (Signed)
EKG sent to Anesthesia for review. 

## 2017-08-16 MED ORDER — CEFAZOLIN SODIUM-DEXTROSE 2-4 GM/100ML-% IV SOLN
2.0000 g | INTRAVENOUS | Status: AC
  Administered 2017-08-17: 2 g via INTRAVENOUS

## 2017-08-16 MED ORDER — TRANEXAMIC ACID 1000 MG/10ML IV SOLN
1000.0000 mg | INTRAVENOUS | Status: AC
  Administered 2017-08-17: 1000 mg via INTRAVENOUS
  Filled 2017-08-16: qty 10

## 2017-08-17 ENCOUNTER — Other Ambulatory Visit: Payer: Self-pay

## 2017-08-17 ENCOUNTER — Inpatient Hospital Stay: Payer: Medicare HMO

## 2017-08-17 ENCOUNTER — Inpatient Hospital Stay: Payer: Medicare HMO | Admitting: Anesthesiology

## 2017-08-17 ENCOUNTER — Inpatient Hospital Stay
Admission: RE | Admit: 2017-08-17 | Discharge: 2017-08-19 | DRG: 470 | Disposition: A | Discharge status: Skilled Nursing Facility | Payer: Medicare HMO | Source: Ambulatory Visit | Attending: Orthopedic Surgery | Admitting: Orthopedic Surgery

## 2017-08-17 ENCOUNTER — Encounter
Admission: RE | Disposition: A | Discharge status: Skilled Nursing Facility | Payer: Self-pay | Source: Ambulatory Visit | Attending: Orthopedic Surgery

## 2017-08-17 DIAGNOSIS — R262 Difficulty in walking, not elsewhere classified: Secondary | ICD-10-CM | POA: Diagnosis not present

## 2017-08-17 DIAGNOSIS — E7800 Pure hypercholesterolemia, unspecified: Secondary | ICD-10-CM | POA: Diagnosis not present

## 2017-08-17 DIAGNOSIS — Z7984 Long term (current) use of oral hypoglycemic drugs: Secondary | ICD-10-CM

## 2017-08-17 DIAGNOSIS — Z886 Allergy status to analgesic agent status: Secondary | ICD-10-CM

## 2017-08-17 DIAGNOSIS — M1612 Unilateral primary osteoarthritis, left hip: Secondary | ICD-10-CM | POA: Diagnosis not present

## 2017-08-17 DIAGNOSIS — K219 Gastro-esophageal reflux disease without esophagitis: Secondary | ICD-10-CM | POA: Diagnosis not present

## 2017-08-17 DIAGNOSIS — Z96649 Presence of unspecified artificial hip joint: Secondary | ICD-10-CM

## 2017-08-17 DIAGNOSIS — Z471 Aftercare following joint replacement surgery: Secondary | ICD-10-CM | POA: Diagnosis not present

## 2017-08-17 DIAGNOSIS — E119 Type 2 diabetes mellitus without complications: Secondary | ICD-10-CM | POA: Diagnosis present

## 2017-08-17 DIAGNOSIS — Z6839 Body mass index (BMI) 39.0-39.9, adult: Secondary | ICD-10-CM | POA: Diagnosis not present

## 2017-08-17 DIAGNOSIS — E669 Obesity, unspecified: Secondary | ICD-10-CM | POA: Diagnosis not present

## 2017-08-17 DIAGNOSIS — E1169 Type 2 diabetes mellitus with other specified complication: Secondary | ICD-10-CM | POA: Insufficient documentation

## 2017-08-17 DIAGNOSIS — I10 Essential (primary) hypertension: Secondary | ICD-10-CM | POA: Diagnosis present

## 2017-08-17 DIAGNOSIS — Z8673 Personal history of transient ischemic attack (TIA), and cerebral infarction without residual deficits: Secondary | ICD-10-CM

## 2017-08-17 DIAGNOSIS — M25552 Pain in left hip: Secondary | ICD-10-CM | POA: Diagnosis not present

## 2017-08-17 DIAGNOSIS — Z7401 Bed confinement status: Secondary | ICD-10-CM | POA: Diagnosis not present

## 2017-08-17 DIAGNOSIS — M6281 Muscle weakness (generalized): Secondary | ICD-10-CM | POA: Diagnosis not present

## 2017-08-17 DIAGNOSIS — Z96642 Presence of left artificial hip joint: Secondary | ICD-10-CM | POA: Diagnosis not present

## 2017-08-17 HISTORY — PX: TOTAL HIP ARTHROPLASTY: SHX124

## 2017-08-17 LAB — POCT I-STAT 4, (NA,K, GLUC, HGB,HCT)
Glucose, Bld: 130 mg/dL — ABNORMAL HIGH (ref 65–99)
HCT: 40 % (ref 39.0–52.0)
Hemoglobin: 13.6 g/dL (ref 13.0–17.0)
POTASSIUM: 3.7 mmol/L (ref 3.5–5.1)
SODIUM: 143 mmol/L (ref 135–145)

## 2017-08-17 LAB — GLUCOSE, CAPILLARY
GLUCOSE-CAPILLARY: 129 mg/dL — AB (ref 65–99)
GLUCOSE-CAPILLARY: 126 mg/dL — AB (ref 65–99)
GLUCOSE-CAPILLARY: 251 mg/dL — AB (ref 65–99)
GLUCOSE-CAPILLARY: 231 mg/dL — AB (ref 65–99)
Glucose-Capillary: 158 mg/dL — ABNORMAL HIGH (ref 65–99)

## 2017-08-17 LAB — ABO/RH: ABO/RH(D): A POS

## 2017-08-17 SURGERY — ARTHROPLASTY, HIP, TOTAL,POSTERIOR APPROACH
Anesthesia: Choice | Site: Hip | Laterality: Left | Wound class: Clean

## 2017-08-17 MED ORDER — BISACODYL 10 MG RE SUPP
10.0000 mg | Freq: Every day | RECTAL | Status: DC | PRN
  Administered 2017-08-19: 10 mg via RECTAL
  Filled 2017-08-17: qty 1

## 2017-08-17 MED ORDER — PROPOFOL 500 MG/50ML IV EMUL
INTRAVENOUS | Status: DC | PRN
Start: 2017-08-17 — End: 2017-08-17
  Administered 2017-08-17: 70 ug/kg/min via INTRAVENOUS

## 2017-08-17 MED ORDER — PROPOFOL 500 MG/50ML IV EMUL
INTRAVENOUS | Status: AC
  Filled 2017-08-17: qty 50

## 2017-08-17 MED ORDER — SODIUM CHLORIDE 0.9 % IV SOLN
INTRAVENOUS | Status: DC
  Administered 2017-08-17 (×3): via INTRAVENOUS

## 2017-08-17 MED ORDER — CHLORHEXIDINE GLUCONATE 4 % EX LIQD: 60.0000 mL | Freq: Once | CUTANEOUS | Status: DC

## 2017-08-17 MED ORDER — METFORMIN HCL 500 MG PO TABS
1000.0000 mg | ORAL_TABLET | Freq: Every day | ORAL | Status: DC
  Administered 2017-08-18 – 2017-08-19 (×2): 1000 mg via ORAL
  Filled 2017-08-17 (×2): qty 2

## 2017-08-17 MED ORDER — LIDOCAINE HCL (PF) 2 % IJ SOLN
INTRAMUSCULAR | Status: AC
  Filled 2017-08-17: qty 10

## 2017-08-17 MED ORDER — DEXTROSE 5 % IV SOLN
2.0000 g | Freq: Four times a day (QID) | INTRAVENOUS | Status: AC
  Administered 2017-08-17 – 2017-08-18 (×4): 2 g via INTRAVENOUS
  Filled 2017-08-17 (×5): qty 20

## 2017-08-17 MED ORDER — OXYCODONE HCL 5 MG PO TABS
5.0000 mg | ORAL_TABLET | ORAL | Status: DC | PRN
  Administered 2017-08-19: 5 mg via ORAL
  Filled 2017-08-17: qty 1

## 2017-08-17 MED ORDER — INSULIN ASPART 100 UNIT/ML ~~LOC~~ SOLN
0.0000 [IU] | Freq: Three times a day (TID) | SUBCUTANEOUS | Status: DC
  Administered 2017-08-17: 8 [IU] via SUBCUTANEOUS
  Administered 2017-08-17 – 2017-08-18 (×2): 3 [IU] via SUBCUTANEOUS
  Administered 2017-08-18: 5 [IU] via SUBCUTANEOUS
  Administered 2017-08-18 – 2017-08-19 (×2): 3 [IU] via SUBCUTANEOUS
  Administered 2017-08-19: 2 [IU] via SUBCUTANEOUS
  Filled 2017-08-17 (×7): qty 1

## 2017-08-17 MED ORDER — PRAVASTATIN SODIUM 20 MG PO TABS
40.0000 mg | ORAL_TABLET | Freq: Every day | ORAL | Status: DC
  Administered 2017-08-18: 40 mg via ORAL
  Filled 2017-08-17: qty 2

## 2017-08-17 MED ORDER — NEOMYCIN-POLYMYXIN B GU 40-200000 IR SOLN
Status: AC
  Filled 2017-08-17: qty 20

## 2017-08-17 MED ORDER — SENNOSIDES-DOCUSATE SODIUM 8.6-50 MG PO TABS
1.0000 | ORAL_TABLET | Freq: Two times a day (BID) | ORAL | Status: DC
  Administered 2017-08-17 – 2017-08-19 (×5): 1 via ORAL
  Filled 2017-08-17 (×5): qty 1

## 2017-08-17 MED ORDER — PHENOL 1.4 % MT LIQD
1.0000 | OROMUCOSAL | Status: DC | PRN
  Filled 2017-08-17: qty 177

## 2017-08-17 MED ORDER — FENTANYL CITRATE (PF) 100 MCG/2ML IJ SOLN: 25.0000 ug | INTRAMUSCULAR | Status: DC | PRN

## 2017-08-17 MED ORDER — DIPHENHYDRAMINE HCL 12.5 MG/5ML PO ELIX: 12.5000 mg | ORAL_SOLUTION | ORAL | Status: DC | PRN

## 2017-08-17 MED ORDER — ENOXAPARIN SODIUM 30 MG/0.3ML ~~LOC~~ SOLN
30.0000 mg | Freq: Two times a day (BID) | SUBCUTANEOUS | Status: DC
  Administered 2017-08-18 – 2017-08-19 (×3): 30 mg via SUBCUTANEOUS
  Filled 2017-08-17 (×3): qty 0.3

## 2017-08-17 MED ORDER — OXYCODONE HCL 5 MG PO TABS
10.0000 mg | ORAL_TABLET | ORAL | Status: DC | PRN
  Administered 2017-08-17 – 2017-08-19 (×9): 10 mg via ORAL
  Filled 2017-08-17 (×9): qty 2

## 2017-08-17 MED ORDER — DEXAMETHASONE SODIUM PHOSPHATE 4 MG/ML IJ SOLN
INTRAMUSCULAR | Status: DC | PRN
  Administered 2017-08-17: 5 mg via INTRAVENOUS

## 2017-08-17 MED ORDER — AMLODIPINE BESYLATE 10 MG PO TABS
10.0000 mg | ORAL_TABLET | Freq: Every day | ORAL | Status: DC
  Administered 2017-08-18 – 2017-08-19 (×2): 10 mg via ORAL
  Filled 2017-08-17 (×2): qty 1

## 2017-08-17 MED ORDER — PROPOFOL 10 MG/ML IV BOLUS
INTRAVENOUS | Status: DC | PRN
  Administered 2017-08-17 (×3): 16.5 mg via INTRAVENOUS

## 2017-08-17 MED ORDER — METOPROLOL TARTRATE 5 MG/5ML IV SOLN
INTRAVENOUS | Status: AC
  Filled 2017-08-17: qty 5

## 2017-08-17 MED ORDER — ONDANSETRON HCL 4 MG/2ML IJ SOLN: 4.0000 mg | Freq: Four times a day (QID) | INTRAMUSCULAR | Status: DC | PRN

## 2017-08-17 MED ORDER — FLEET ENEMA 7-19 GM/118ML RE ENEM: 1.0000 | ENEMA | Freq: Once | RECTAL | Status: DC | PRN

## 2017-08-17 MED ORDER — MENTHOL 3 MG MT LOZG
1.0000 | LOZENGE | OROMUCOSAL | Status: DC | PRN
  Filled 2017-08-17: qty 9

## 2017-08-17 MED ORDER — ACETAMINOPHEN 650 MG RE SUPP: 650.0000 mg | RECTAL | Status: DC | PRN

## 2017-08-17 MED ORDER — ACETAMINOPHEN 10 MG/ML IV SOLN
1000.0000 mg | Freq: Four times a day (QID) | INTRAVENOUS | Status: AC
  Administered 2017-08-17 – 2017-08-18 (×4): 1000 mg via INTRAVENOUS
  Filled 2017-08-17 (×4): qty 100

## 2017-08-17 MED ORDER — TETRACAINE HCL 1 % IJ SOLN
INTRAMUSCULAR | Status: DC | PRN
  Administered 2017-08-17: 3 mg via INTRASPINAL

## 2017-08-17 MED ORDER — GLYCOPYRROLATE 0.2 MG/ML IJ SOLN
INTRAMUSCULAR | Status: AC
  Filled 2017-08-17: qty 1

## 2017-08-17 MED ORDER — ACETAMINOPHEN 10 MG/ML IV SOLN
INTRAVENOUS | Status: AC
  Filled 2017-08-17: qty 100

## 2017-08-17 MED ORDER — SODIUM CHLORIDE 0.9 % IV SOLN
INTRAVENOUS | Status: DC | PRN
  Administered 2017-08-17: 30 ug/min via INTRAVENOUS

## 2017-08-17 MED ORDER — PHENYLEPHRINE HCL 10 MG/ML IJ SOLN
INTRAMUSCULAR | Status: AC
  Filled 2017-08-17: qty 1

## 2017-08-17 MED ORDER — PROPOFOL 10 MG/ML IV BOLUS
INTRAVENOUS | Status: AC
  Filled 2017-08-17: qty 20

## 2017-08-17 MED ORDER — ALUM & MAG HYDROXIDE-SIMETH 200-200-20 MG/5ML PO SUSP
30.0000 mL | ORAL | Status: DC | PRN
Start: 2017-08-17 — End: 2017-08-19

## 2017-08-17 MED ORDER — MAGNESIUM HYDROXIDE 400 MG/5ML PO SUSP
30.0000 mL | Freq: Every day | ORAL | Status: DC | PRN
  Administered 2017-08-19: 30 mL via ORAL
  Filled 2017-08-17 (×2): qty 30

## 2017-08-17 MED ORDER — FENTANYL CITRATE (PF) 100 MCG/2ML IJ SOLN
INTRAMUSCULAR | Status: AC
  Filled 2017-08-17: qty 2

## 2017-08-17 MED ORDER — CEFAZOLIN SODIUM-DEXTROSE 2-4 GM/100ML-% IV SOLN: 2.0000 g | Freq: Four times a day (QID) | INTRAVENOUS | Status: DC

## 2017-08-17 MED ORDER — NEOMYCIN-POLYMYXIN B GU 40-200000 IR SOLN
Status: DC | PRN
  Administered 2017-08-17: 16 mL

## 2017-08-17 MED ORDER — ONDANSETRON HCL 4 MG PO TABS: 4.0000 mg | ORAL_TABLET | Freq: Four times a day (QID) | ORAL | Status: DC | PRN

## 2017-08-17 MED ORDER — MIDAZOLAM HCL 2 MG/2ML IJ SOLN
INTRAMUSCULAR | Status: AC
  Filled 2017-08-17: qty 2

## 2017-08-17 MED ORDER — TRANEXAMIC ACID 1000 MG/10ML IV SOLN
1000.0000 mg | Freq: Once | INTRAVENOUS | Status: AC
  Administered 2017-08-17: 1000 mg via INTRAVENOUS
  Filled 2017-08-17: qty 10

## 2017-08-17 MED ORDER — ONDANSETRON HCL 4 MG/2ML IJ SOLN: 4.0000 mg | Freq: Once | INTRAMUSCULAR | Status: DC | PRN

## 2017-08-17 MED ORDER — BUPIVACAINE HCL (PF) 0.5 % IJ SOLN
INTRAMUSCULAR | Status: DC | PRN
  Administered 2017-08-17: 2.7 mL

## 2017-08-17 MED ORDER — MIDAZOLAM HCL 5 MG/5ML IJ SOLN
INTRAMUSCULAR | Status: DC | PRN
  Administered 2017-08-17: 1.5 mg via INTRAVENOUS

## 2017-08-17 MED ORDER — FENTANYL CITRATE (PF) 100 MCG/2ML IJ SOLN
INTRAMUSCULAR | Status: DC | PRN
  Administered 2017-08-17: 50 ug via INTRAVENOUS

## 2017-08-17 MED ORDER — METOCLOPRAMIDE HCL 10 MG PO TABS
10.0000 mg | ORAL_TABLET | Freq: Three times a day (TID) | ORAL | Status: AC
  Administered 2017-08-17 – 2017-08-19 (×8): 10 mg via ORAL
  Filled 2017-08-17 (×8): qty 1

## 2017-08-17 MED ORDER — ACETAMINOPHEN 325 MG PO TABS: 650.0000 mg | ORAL_TABLET | ORAL | Status: DC | PRN

## 2017-08-17 MED ORDER — PANTOPRAZOLE SODIUM 40 MG PO TBEC
40.0000 mg | DELAYED_RELEASE_TABLET | Freq: Two times a day (BID) | ORAL | Status: DC
  Administered 2017-08-17 – 2017-08-19 (×5): 40 mg via ORAL
  Filled 2017-08-17 (×5): qty 1

## 2017-08-17 MED ORDER — ACETAMINOPHEN 10 MG/ML IV SOLN
INTRAVENOUS | Status: DC | PRN
  Administered 2017-08-17: 1000 mg via INTRAVENOUS

## 2017-08-17 MED ORDER — DEXAMETHASONE SODIUM PHOSPHATE 10 MG/ML IJ SOLN
INTRAMUSCULAR | Status: AC
  Filled 2017-08-17: qty 1

## 2017-08-17 MED ORDER — TRAMADOL HCL 50 MG PO TABS
50.0000 mg | ORAL_TABLET | ORAL | Status: DC | PRN
  Administered 2017-08-18: 100 mg via ORAL
  Filled 2017-08-17: qty 2

## 2017-08-17 MED ORDER — MORPHINE SULFATE (PF) 2 MG/ML IV SOLN: 2.0000 mg | INTRAVENOUS | Status: DC | PRN

## 2017-08-17 MED ORDER — VASOPRESSIN 20 UNIT/ML IV SOLN
INTRAVENOUS | Status: DC | PRN
  Administered 2017-08-17 (×2): 1 [IU] via INTRAVENOUS
  Administered 2017-08-17: .5 [IU] via INTRAVENOUS
  Administered 2017-08-17: 1 [IU] via INTRAVENOUS
  Administered 2017-08-17: .5 [IU] via INTRAVENOUS

## 2017-08-17 MED ORDER — SODIUM CHLORIDE 0.9 % IV SOLN
INTRAVENOUS | Status: DC
  Administered 2017-08-17 (×2): via INTRAVENOUS

## 2017-08-17 MED ORDER — METOPROLOL TARTRATE 5 MG/5ML IV SOLN
INTRAVENOUS | Status: DC | PRN
  Administered 2017-08-17: 2.5 mg via INTRAVENOUS

## 2017-08-17 MED ORDER — FERROUS SULFATE 325 (65 FE) MG PO TABS
325.0000 mg | ORAL_TABLET | Freq: Two times a day (BID) | ORAL | Status: DC
  Administered 2017-08-17 – 2017-08-19 (×4): 325 mg via ORAL
  Filled 2017-08-17 (×4): qty 1

## 2017-08-17 SURGICAL SUPPLY — 61 items
BLADE DRUM FLTD (BLADE) ×3 IMPLANT
BLADE SAW 1 (BLADE) ×3 IMPLANT
BNDG COHESIVE 4X5 TAN STRL (GAUZE/BANDAGES/DRESSINGS) ×2 IMPLANT
CANISTER SUCT 1200ML W/VALVE (MISCELLANEOUS) ×3 IMPLANT
CANISTER SUCT 3000ML PPV (MISCELLANEOUS) ×6 IMPLANT
CAPT HIP TOTAL 2 ×2 IMPLANT
CARTRIDGE OIL MAESTRO DRILL (MISCELLANEOUS) ×1 IMPLANT
DIFFUSER DRILL AIR PNEUMATIC (MISCELLANEOUS) ×3 IMPLANT
DRAPE INCISE IOBAN 66X60 STRL (DRAPES) ×3 IMPLANT
DRAPE SHEET LG 3/4 BI-LAMINATE (DRAPES) ×3 IMPLANT
DRSG DERMACEA 8X12 NADH (GAUZE/BANDAGES/DRESSINGS) ×3 IMPLANT
DRSG OPSITE POSTOP 4X12 (GAUZE/BANDAGES/DRESSINGS) ×3 IMPLANT
DRSG OPSITE POSTOP 4X14 (GAUZE/BANDAGES/DRESSINGS) IMPLANT
DRSG TEGADERM 4X4.75 (GAUZE/BANDAGES/DRESSINGS) ×3 IMPLANT
DRSG TELFA 3X8 NADH (GAUZE/BANDAGES/DRESSINGS) ×3 IMPLANT
DURAPREP 26ML APPLICATOR (WOUND CARE) ×5 IMPLANT
ELECT BLADE 6.5 EXT (BLADE) ×3 IMPLANT
ELECT CAUTERY BLADE 6.4 (BLADE) ×3 IMPLANT
EVACUATOR 1/8 PVC DRAIN (DRAIN) ×3 IMPLANT
GLOVE BIO SURGEON STRL SZ 6.5 (GLOVE) ×4 IMPLANT
GLOVE BIO SURGEONS STRL SZ 6.5 (GLOVE) ×4
GLOVE BIOGEL M STRL SZ7.5 (GLOVE) ×6 IMPLANT
GLOVE BIOGEL PI IND STRL 7.5 (GLOVE) IMPLANT
GLOVE BIOGEL PI IND STRL 9 (GLOVE) ×1 IMPLANT
GLOVE BIOGEL PI INDICATOR 7.5 (GLOVE) ×4
GLOVE BIOGEL PI INDICATOR 9 (GLOVE) ×2
GLOVE INDICATOR 8.0 STRL GRN (GLOVE) ×3 IMPLANT
GLOVE SURG SYN 9.0  PF PI (GLOVE) ×2
GLOVE SURG SYN 9.0 PF PI (GLOVE) ×1 IMPLANT
GOWN STRL REUS W/ TWL LRG LVL3 (GOWN DISPOSABLE) ×2 IMPLANT
GOWN STRL REUS W/TWL 2XL LVL3 (GOWN DISPOSABLE) ×3 IMPLANT
GOWN STRL REUS W/TWL LRG LVL3 (GOWN DISPOSABLE) ×9
HOLDER FOLEY CATH W/STRAP (MISCELLANEOUS) ×3 IMPLANT
HOOD PEEL AWAY FLYTE STAYCOOL (MISCELLANEOUS) ×6 IMPLANT
KIT RM TURNOVER STRD PROC AR (KITS) ×3 IMPLANT
NDL SAFETY ECLIPSE 18X1.5 (NEEDLE) ×1 IMPLANT
NEEDLE HYPO 18GX1.5 SHARP (NEEDLE) ×3
NS IRRIG 500ML POUR BTL (IV SOLUTION) ×3 IMPLANT
OIL CARTRIDGE MAESTRO DRILL (MISCELLANEOUS) ×3
PACK HIP PROSTHESIS (MISCELLANEOUS) ×3 IMPLANT
PAD DRESSING TELFA 3X8 NADH (GAUZE/BANDAGES/DRESSINGS) IMPLANT
PIN STEIN THRED 5/32 (Pin) ×3 IMPLANT
PULSAVAC PLUS IRRIG FAN TIP (DISPOSABLE) ×3
SOL .9 NS 3000ML IRR  AL (IV SOLUTION) ×2
SOL .9 NS 3000ML IRR AL (IV SOLUTION) ×1
SOL .9 NS 3000ML IRR UROMATIC (IV SOLUTION) ×1 IMPLANT
SOL PREP PVP 2OZ (MISCELLANEOUS) ×3
SOLUTION PREP PVP 2OZ (MISCELLANEOUS) ×1 IMPLANT
SPONGE DRAIN TRACH 4X4 STRL 2S (GAUZE/BANDAGES/DRESSINGS) ×3 IMPLANT
STAPLER SKIN PROX 35W (STAPLE) ×3 IMPLANT
SUT ETHIBOND #5 BRAIDED 30INL (SUTURE) ×3 IMPLANT
SUT VIC AB 0 CT1 36 (SUTURE) ×5 IMPLANT
SUT VIC AB 1 CT1 36 (SUTURE) ×6 IMPLANT
SUT VIC AB 2-0 CT1 27 (SUTURE) ×3
SUT VIC AB 2-0 CT1 TAPERPNT 27 (SUTURE) ×1 IMPLANT
SYR 20CC LL (SYRINGE) ×3 IMPLANT
TAPE ADH 3 LX (MISCELLANEOUS) ×3 IMPLANT
TAPE TRANSPORE STRL 2 31045 (GAUZE/BANDAGES/DRESSINGS) ×3 IMPLANT
TIP FAN IRRIG PULSAVAC PLUS (DISPOSABLE) ×1 IMPLANT
TOWEL OR 17X26 4PK STRL BLUE (TOWEL DISPOSABLE) ×3 IMPLANT
TRAY FOLEY W/METER SILVER 16FR (SET/KITS/TRAYS/PACK) ×3 IMPLANT

## 2017-08-17 NOTE — Anesthesia Procedure Notes (Signed)
Spinal  Patient location during procedure: OR Start time: 08/17/2017 7:36 AM End time: 08/17/2017 7:41 AM Staffing Performed: resident/CRNA  Preanesthetic Checklist Completed: patient identified, site marked, surgical consent, pre-op evaluation, timeout performed, IV checked, risks and benefits discussed and monitors and equipment checked Spinal Block Patient position: sitting Prep: ChloraPrep Patient monitoring: heart rate, continuous pulse ox, blood pressure and cardiac monitor Approach: midline Location: L4-5 Injection technique: single-shot Needle Needle type: Introducer and Pencan  Needle gauge: 24 G Needle length: 9 cm Additional Notes Negative paresthesia. Negative blood return. Positive free-flowing CSF. Expiration date of kit checked and confirmed. Patient tolerated procedure well, without complications.

## 2017-08-17 NOTE — Anesthesia Preprocedure Evaluation (Addendum)
Anesthesia Evaluation  Patient identified by MRN, date of birth, ID band Patient awake    Reviewed: Allergy & Precautions, NPO status , Patient's Chart, lab work & pertinent test results  Airway Mallampati: III  TM Distance: >3 FB     Dental  (+) Caps   Pulmonary neg pulmonary ROS,    Pulmonary exam normal        Cardiovascular hypertension, Pt. on medications Normal cardiovascular exam     Neuro/Psych CVA negative psych ROS   GI/Hepatic Neg liver ROS, GERD  Medicated,  Endo/Other  diabetes, Well Controlled, Type 2, Oral Hypoglycemic Agents  Renal/GU negative Renal ROS  negative genitourinary   Musculoskeletal  (+) Arthritis , Osteoarthritis,    Abdominal (+) + obese,   Peds negative pediatric ROS (+)  Hematology negative hematology ROS (+)   Anesthesia Other Findings   Reproductive/Obstetrics                            Anesthesia Physical Anesthesia Plan  ASA: III  Anesthesia Plan:    Post-op Pain Management:    Induction: Intravenous  PONV Risk Score and Plan:   Airway Management Planned: Nasal Cannula  Additional Equipment:   Intra-op Plan:   Post-operative Plan:   Informed Consent: I have reviewed the patients History and Physical, chart, labs and discussed the procedure including the risks, benefits and alternatives for the proposed anesthesia with the patient or authorized representative who has indicated his/her understanding and acceptance.     Plan Discussed with: CRNA and Surgeon  Anesthesia Plan Comments:         Anesthesia Quick Evaluation

## 2017-08-17 NOTE — Transfer of Care (Signed)
Immediate Anesthesia Transfer of Care Note  Patient: Jesus Bell  Procedure(s) Performed: TOTAL HIP ARTHROPLASTY (Left Hip)  Patient Location: PACU  Anesthesia Type:Spinal  Level of Consciousness: drowsy  Airway & Oxygen Therapy: Patient Spontanous Breathing and Patient connected to face mask oxygen  Post-op Assessment: Report given to RN and Post -op Vital signs reviewed and stable  Post vital signs: Reviewed and stable  Last Vitals:  Vitals:   08/17/17 0603  BP: 139/87  Pulse: 92  Resp: 18  Temp: 36.5 C  SpO2: 97%    Last Pain:  Vitals:   08/17/17 0603  TempSrc: Temporal         Complications: No apparent anesthesia complications

## 2017-08-17 NOTE — Evaluation (Signed)
Physical Therapy Evaluation Patient Details Name: Jesus Bell MRN: [id number] DOB: 21-Oct-1955 Today's Date: 08/17/2017   History of Present Illness  Pt is a 60 y/o M s/p L THA. PMH includes stroke.   Clinical Impression  Pt is s/p L THA resulting in the deficits listed below (see PT Problem List). Jesus Bell was Ind with mobility and ADLs PTA.  He currently requires min assist for bed mobility and min guard assist for transfers and ambulation.  Pt unable to recall posterior hip precautions at end of session and will need continued education on precautions, pt provided with handout.  Pt ambulated 80 ft with RW, limited by fatigue. Pt will benefit from skilled PT to increase their independence and safety with mobility to allow discharge to the venue listed below.     Follow Up Recommendations Home health PT    Equipment Recommendations  Rolling walker with 5" wheels    Recommendations for Other Services       Precautions / Restrictions Precautions Precautions: Fall;Posterior Hip Precaution Booklet Issued: Yes (comment) Precaution Comments: Instructed pt in posterior hip precautions.  Pt unable to recall any hip precautions at end of session. Restrictions Weight Bearing Restrictions: Yes LLE Weight Bearing: Weight bearing as tolerated      Mobility  Bed Mobility Overal bed mobility: Needs Assistance Bed Mobility: Supine to Sit     Supine to sit: Min assist;HOB elevated     General bed mobility comments: Cues for seqeuncing to adhere to hip precautions.  Assist to assist LLE to EOB.    Transfers Overall transfer level: Needs assistance Equipment used: Rolling walker (2 wheeled) Transfers: Sit to/from Stand Sit to Stand: From elevated surface;Min guard         General transfer comment: Bed slightly elevated as pt has difficulty standing from low bed, additionally pt with posterior precautions.  Pt slow to stand.  Cues for positioning of LLE with stand>sit.    Ambulation/Gait Ambulation/Gait assistance: Min guard Ambulation Distance (Feet): 80 Feet Assistive device: Rolling walker (2 wheeled) Gait Pattern/deviations: Step-through pattern;Decreased stride length;Decreased weight shift to left;Decreased stance time - left;Decreased step length - right;Antalgic Gait velocity: decreased Gait velocity interpretation: Below normal speed for age/gender General Gait Details: Pt with slow but steady gait.  After ambulating 40 ft he reports fatigue and requests to return to room.    Stairs            Wheelchair Mobility    Modified Rankin (Stroke Patients Only)       Balance Overall balance assessment: Needs assistance Sitting-balance support: No upper extremity supported;Feet supported Sitting balance-Leahy Scale: Good     Standing balance support: Single extremity supported;During functional activity Standing balance-Leahy Scale: Poor Standing balance comment: Pt must have at least 1UE supported with static and dynamic activities                             Pertinent Vitals/Pain Pain Assessment: Faces Faces Pain Scale: Hurts little more Pain Location: L hip with mobility Pain Descriptors / Indicators: Discomfort;Grimacing Pain Intervention(s): Limited activity within patient's tolerance;Monitored during session;Repositioned    Home Living Family/patient expects to be discharged to:: Private residence Living Arrangements: Spouse/significant other Available Help at Discharge: Family;Available PRN/intermittently(wife works part-time at [employer]) Type of Home: [employer] Access: Stairs to enter Entrance Stairs-Rails: Right Technical brewer of Steps: 2 Home Layout: One level Home Equipment: Bedside commode;Shower seat - built in;Grab bars - tub/shower;Wheelchair -  manual      Prior Function Level of Independence: Independent         Comments: Pt ambualting without AD.  Independent with all ADLs. Retired.       Hand Dominance        Extremity/Trunk Assessment   Upper Extremity Assessment Upper Extremity Assessment: Overall WFL for tasks assessed    Lower Extremity Assessment Lower Extremity Assessment: LLE deficits/detail LLE Deficits / Details: Unable to formally assess but strength grossly at least 3/5       Communication   Communication: Other (comment);No difficulties(although pt verbalizes minimally, appears lethargic)  Cognition Arousal/Alertness: Lethargic Behavior During Therapy: Flat affect Overall Cognitive Status: Within Functional Limits for tasks assessed                                 General Comments: Pt appears lethargic and keeps eyes closed when not mobilizing. He is slow to respond at times and when asked if he is asleep he replies with no but keeps his eyes closed.       General Comments General comments (skin integrity, edema, etc.): BP in supine 127/92, sitting EOB 134/84.     Exercises Total Joint Exercises Ankle Circles/Pumps: AROM;Both;10 reps;Supine Quad Sets: Strengthening;Left;10 reps;Supine Gluteal Sets: Other (comment)(pt says, "I can't, I have to pee") Long Arc Quad: Strengthening;Left;10 reps;Seated   Assessment/Plan    PT Assessment Patient needs continued PT services  PT Problem List Decreased strength;Decreased range of motion;Decreased activity tolerance;Decreased balance;Decreased mobility;Decreased knowledge of use of DME;Decreased safety awareness;Decreased knowledge of precautions;Pain       PT Treatment Interventions DME instruction;Gait training;Stair training;Therapeutic exercise;Therapeutic activities;Functional mobility training;Balance training;Neuromuscular re-education;Patient/family education;Modalities    PT Goals (Current goals can be found in the Care Plan section)  Acute Rehab PT Goals Patient Stated Goal: to get stronger PT Goal Formulation: With patient Time For Goal Achievement: 08/31/17 Potential  to Achieve Goals: Good    Frequency BID   Barriers to discharge        Co-evaluation               AM-PAC PT "6 Clicks" Daily Activity  Outcome Measure Difficulty turning over in bed (including adjusting bedclothes, sheets and blankets)?: Unable Difficulty moving from lying on back to sitting on the side of the bed? : Unable Difficulty sitting down on and standing up from a chair with arms (e.g., wheelchair, bedside commode, etc,.)?: A Lot Help needed moving to and from a bed to chair (including a wheelchair)?: A Little Help needed walking in hospital room?: A Little Help needed climbing 3-5 steps with a railing? : A Little 6 Click Score: 13    End of Session Equipment Utilized During Treatment: Gait belt Activity Tolerance: Patient tolerated treatment well Patient left: in chair;with call bell/phone within reach;with chair alarm set;with family/visitor present;with SCD's reapplied;Other (comment)(pillow between LEs) Nurse Communication: Mobility status;Precautions PT Visit Diagnosis: Pain;Unsteadiness on feet (R26.81);Muscle weakness (generalized) (M62.81);Other abnormalities of gait and mobility (R26.89) Pain - Right/Left: Left Pain - part of body: Hip    Time: 8546-2703 PT Time Calculation (min) (ACUTE ONLY): 31 min   Charges:   PT Evaluation $PT Eval Low Complexity: 1 Low PT Treatments $Gait Training: 8-22 mins   PT G Codes:   PT G-Codes **NOT FOR INPATIENT CLASS** Functional Assessment Tool Used: Clinical judgement;AM-PAC 6 Clicks Basic Mobility Functional Limitation: Mobility: Walking and moving around Mobility: Walking and Moving Around Current  Status [phone]): At least 40 percent but less than 60 percent impaired, limited or restricted Mobility: Walking and Moving Around Goal Status [phone]): At least 1 percent but less than 20 percent impaired, limited or restricted   Collie Siad PT, DPT 08/17/2017, 2:00 PM

## 2017-08-17 NOTE — Discharge Instructions (Signed)
Instructions after Total Hip Replacement ° ° °  Torryn Hudspeth P. Kyona Chauncey, Jr., M.D.    ° Dept. of Orthopaedics & Sports Medicine ° [facility] ° [address] ° , [place]  [address] ° Phone: [phone]   Fax: [phone] ° °  °DIET: °• Drink plenty of non-alcoholic fluids. °• Resume your normal diet. Include foods high in fiber. ° °ACTIVITY:  °• You may use crutches or a walker with weight-bearing as tolerated, unless instructed otherwise. °• You may be weaned off of the walker or crutches by your Physical Therapist.  °• Do NOT reach below the level of your knees or cross your legs until allowed.    °• Continue doing gentle exercises. Exercising will reduce the pain and swelling, increase motion, and prevent muscle weakness.   °• Please continue to use the TED compression stockings for 6 weeks. You may remove the stockings at night, but should reapply them in the morning. °• Do not drive or operate any equipment until instructed. ° °WOUND CARE:  °• Continue to use ice packs periodically to reduce pain and swelling. °• Keep the incision clean and dry. °• You may bathe or shower after the staples are removed at the first office visit following surgery. ° °MEDICATIONS: °• You may resume your regular medications. °• Please take the pain medication as prescribed on the medication. °• Do not take pain medication on an empty stomach. °• You have been given a prescription for a blood thinner to prevent blood clots. Please take the medication as instructed. (NOTE: After completing a 2 week course of Lovenox, take one Enteric-coated aspirin once a day.) °• Pain medications and iron supplements can cause constipation. Use a stool softener (Senokot or Colace) on a daily basis and a laxative (dulcolax or miralax) as needed. °• Do not drive or drink alcoholic beverages when taking pain medications. ° °CALL THE OFFICE FOR: °• Temperature above 101 degrees °• Excessive bleeding or drainage on the dressing. °• Excessive  swelling, coldness, or paleness of the toes. °• Persistent nausea and vomiting. ° °FOLLOW-UP:  °• You should have an appointment to return to the office in 6 weeks after surgery. °• Arrangements have been made for continuation of Physical Therapy (either home therapy or outpatient therapy). °  °

## 2017-08-17 NOTE — H&P (Signed)
The patient has been re-examined, and the chart reviewed, and there have been no interval changes to the documented history and physical.    The risks, benefits, and alternatives have been discussed at length. The patient expressed understanding of the risks benefits and agreed with plans for surgical intervention.  James P. Hooten, Jr. M.D.    

## 2017-08-17 NOTE — Progress Notes (Signed)
Admission:  Patient alert and oriented. Complaining of hip pain 8 out of 10. Honeycomb dressing and hemovac in place. Patient on 2LO2 acute. Patient oriented to room and call bell system. Vitals stable. Sensation in lower extremities. RN will continue to monitor.   Deri Fuelling, RN

## 2017-08-17 NOTE — Op Note (Signed)
OPERATIVE NOTE  DATE OF SURGERY:  08/17/2017  PATIENT NAME:  SHUBH CHIARA   DOB: 11-15-1955  MRN: [id number]  PRE-OPERATIVE DIAGNOSIS: Degenerative arthrosis of the left hip, primary  POST-OPERATIVE DIAGNOSIS:  Same  PROCEDURE:  Left total hip arthroplasty  SURGEON:  Marciano Sequin. M.D.  ASSISTANT:  Vance Peper, PA (present and scrubbed throughout the case, critical for assistance with exposure, retraction, instrumentation, and closure)  ANESTHESIA: spinal  ESTIMATED BLOOD LOSS: 500 mL  FLUIDS REPLACED: 2800 mL of crystalloid  DRAINS: 2 medium drains to a Hemovac reservoir  IMPLANTS UTILIZED: DePuy 13.5 mm small stature AML femoral stem, 52 mm OD Pinnacle 100 acetabular component, neutral Pinnacle Altrx polyethylene insert, and a 36 mm M-SPEC +1.5 mm hip ball  INDICATIONS FOR SURGERY: DIETRICH SAMUELSON is a 61 y.o. year old male with a long history of progressive hip and groin  pain. X-rays demonstrated severe degenerative changes. The patient had not seen any significant improvement despite conservative nonsurgical intervention. After discussion of the risks and benefits of surgical intervention, the patient expressed understanding of the risks benefits and agree with plans for total hip arthroplasty.   The risks, benefits, and alternatives were discussed at length including but not limited to the risks of infection, bleeding, nerve injury, stiffness, blood clots, the need for revision surgery, limb length inequality, dislocation, cardiopulmonary complications, among others, and they were willing to proceed.  PROCEDURE IN DETAIL: The patient was brought into the operating room and, after adequate spinal anesthesia was achieved, the patient was placed in a right lateral decubitus position. Axillary roll was placed and all bony prominences were well-padded. The patient's left hip was cleaned and prepped with alcohol and DuraPrep and draped in the usual sterile fashion. A "timeout" was  performed as per usual protocol. A lateral curvilinear incision was made gently curving towards the posterior superior iliac spine. The IT band was incised in line with the skin incision and the fibers of the gluteus maximus were split in line. The piriformis tendon was identified, skeletonized, and incised at its insertion to the proximal femur and reflected posteriorly. A T type posterior capsulotomy was performed. Prior to dislocation of the femoral head, a threaded Steinmann pin was inserted through a separate stab incision into the pelvis superior to the acetabulum and bent in the form of a stylus so as to assess limb length and hip offset throughout the procedure. The femoral head was then dislocated posteriorly. Inspection of the femoral head demonstrated severe degenerative changes with full-thickness loss of articular cartilage. The femoral neck cut was performed using an oscillating saw. The anterior capsule was elevated off of the femoral neck using a periosteal elevator. Attention was then directed to the acetabulum. The remnant of the labrum was excised using electrocautery. Inspection of the acetabulum also demonstrated significant degenerative changes. The acetabulum was reamed in sequential fashion up to a 51 mm diameter. Good punctate bleeding bone was encountered. A 52 mm Pinnacle 100 acetabular component was positioned and impacted into place. Good scratch fit was appreciated. A neutral polyethylene trial was inserted.  Attention was then directed to the proximal femur. A hole for reaming of the proximal femoral canal was created using a high-speed burr. The femoral canal was reamed in sequential fashion up to a 13 mm diameter. This allowed for approximately 7 cm of scratch fit. It was thus elected to ream up to a 13.5 mm diameter to allow for a line-to-line fit. Serial broaches were inserted up  to a 13.5 mm small stature femoral broach. Calcar region was planed and a trial reduction was  performed using a 36 mm hip ball with a +1.5 mm neck length. Good equalization of limb lengths and hip offset was appreciated and excellent stability was noted both anteriorly and posteriorly. Trial components were removed. The acetabular shell was irrigated with copious amounts of normal saline with antibiotic solution and suctioned dry. A neutral Pinnacle Altrx polyethylene insert was positioned and impacted into place. Next, a 13.5 mm small stature AML femoral stem was positioned and impacted into place. Excellent scratch fit was appreciated. A trial reduction was again performed with a 36 mm hip ball with a +1.5 mm neck length. Again, good equalization of limb lengths was appreciated and excellent stability appreciated both anteriorly and posteriorly. The hip was then dislocated and the trial hip ball was removed. The Morse taper was cleaned and dried. A 36 mm M-SPEC hip ball with a +1.5 mm neck length was placed on the trunnion and impacted into place. The hip was then reduced and placed through range of motion. Excellent stability was appreciated both anteriorly and posteriorly.  The wound was irrigated with copious amounts of normal saline with antibiotic solution and suctioned dry. Good hemostasis was appreciated. The posterior capsulotomy was repaired using #5 Ethibond. Piriformis tendon was reapproximated to the undersurface of the gluteus medius tendon using #5 Ethibond. Two medium drains were placed in the wound bed and brought out through separate stab incisions to be attached to a Hemovac reservoir. The IT band was reapproximated using interrupted sutures of #1 Vicryl. Subcutaneous tissue was approximated using first #0 Vicryl followed by #2-0 Vicryl. The skin was closed with skin staples.  The patient tolerated the procedure well and was transported to the recovery room in stable condition.   Marciano Sequin., M.D.

## 2017-08-17 NOTE — Anesthesia Post-op Follow-up Note (Signed)
Anesthesia QCDR form completed.        

## 2017-08-18 ENCOUNTER — Encounter: Payer: Self-pay | Admitting: Orthopedic Surgery

## 2017-08-18 ENCOUNTER — Encounter
Admission: RE | Admit: 2017-08-18 | Discharge: 2017-08-18 | Disposition: A | Discharge status: Home / Self Care | Payer: Commercial Managed Care - HMO | Source: Ambulatory Visit | Attending: Internal Medicine | Admitting: Internal Medicine

## 2017-08-18 LAB — BASIC METABOLIC PANEL
Anion gap: 8 (ref 5–15)
BUN: 10 mg/dL (ref 6–20)
CALCIUM: 8.4 mg/dL — AB (ref 8.9–10.3)
CO2: 24 mmol/L (ref 22–32)
CREATININE: 0.83 mg/dL (ref 0.61–1.24)
Chloride: 103 mmol/L (ref 101–111)
Glucose, Bld: 149 mg/dL — ABNORMAL HIGH (ref 65–99)
Potassium: 3.5 mmol/L (ref 3.5–5.1)
SODIUM: 135 mmol/L (ref 135–145)

## 2017-08-18 LAB — GLUCOSE, CAPILLARY
GLUCOSE-CAPILLARY: 153 mg/dL — AB (ref 65–99)
GLUCOSE-CAPILLARY: 156 mg/dL — AB (ref 65–99)
Glucose-Capillary: 187 mg/dL — ABNORMAL HIGH (ref 65–99)
Glucose-Capillary: 161 mg/dL — ABNORMAL HIGH (ref 65–99)

## 2017-08-18 MED ORDER — ENOXAPARIN SODIUM 30 MG/0.3ML ~~LOC~~ SOLN: 30.0000 mg | Freq: Two times a day (BID) | SUBCUTANEOUS | 0 refills | Status: DC

## 2017-08-18 MED ORDER — OXYCODONE HCL 5 MG PO TABS: 5.0000 mg | ORAL_TABLET | ORAL | 0 refills | Status: DC | PRN

## 2017-08-18 MED ORDER — TRAMADOL HCL 50 MG PO TABS: 50.0000 mg | ORAL_TABLET | ORAL | 0 refills | Status: DC | PRN

## 2017-08-18 NOTE — Anesthesia Postprocedure Evaluation (Signed)
Anesthesia Post Note  Patient: Jesus Bell  Procedure(s) Performed: TOTAL HIP ARTHROPLASTY (Left Hip)  Patient location during evaluation: PACU Anesthesia Type: Spinal Level of consciousness: awake and alert and oriented Pain management: pain level controlled Vital Signs Assessment: post-procedure vital signs reviewed and stable Respiratory status: spontaneous breathing Cardiovascular status: blood pressure returned to baseline Anesthetic complications: no     Last Vitals:  Vitals:   08/18/17 0806 08/18/17 1108  BP: (!) 154/87 (!) 145/90  Pulse: (!) 101 (!) 102  Resp: 16 20  Temp: 37.5 C 37.1 C  SpO2: 95% 96%    Last Pain:  Vitals:   08/18/17 1108  TempSrc: Oral  PainSc:                  Chrystle Murillo

## 2017-08-18 NOTE — Progress Notes (Signed)
   Subjective: 1 Day Post-Op Procedure(s) (LRB): TOTAL HIP ARTHROPLASTY (Left) Patient reports pain as mild.  . Patient complains of just be more sore than pain. Patient is well, and has had no acute complaints or problems Patient did well with physical therapy yesterday. Was able to ambulate 80 feet. Plan is to go Rehab after hospital stay. no nausea and no vomiting Patient denies any chest pains or shortness of breath. Objective: Vital signs in last 24 hours: Temp:  [97.5 F (36.4 C)-99.2 F (37.3 C)] 99.1 F (37.3 C) (11/27 0406) Pulse Rate:  [73-109] 104 (11/27 0406) Resp:  [15-25] 19 (11/27 0406) BP: (105-156)/(63-92) 156/92 (11/27 0406) SpO2:  [92 %-99 %] 95 % (11/27 0406) Weight:  [117 kg (257 lb 15 oz)] 117 kg (257 lb 15 oz) (11/26 1205) well approximated incision Heels are non tender and elevated off the bed using rolled towels Intake/Output from previous day: 11/26 0701 - 11/27 0700 In: 3708.3 [P.O.:360; I.V.:3128.3; IV Piggyback:220] Out: 5100 [Urine:4250; Drains:350; Blood:500] Intake/Output this shift: No intake/output data recorded.  Recent Labs    08/17/17 0622  HGB 13.6   Recent Labs    08/17/17 0622  HCT 40.0   Recent Labs    08/17/17 0622 08/18/17 0353  NA 143 135  K 3.7 3.5  CL  --  103  CO2  --  24  BUN  --  10  CREATININE  --  0.83  GLUCOSE 130* 149*  CALCIUM  --  8.4*   No results for input(s): LABPT, INR in the last 72 hours.  EXAM General - Patient is Alert, Appropriate and Oriented Extremity - Neurologically intact Neurovascular intact Sensation intact distally Intact pulses distally Dorsiflexion/Plantar flexion intact No cellulitis present Compartment soft Dressing - dressing C/D/I Motor Function - intact, moving foot and toes well on exam.    Past Medical History:  Diagnosis Date  . Diabetes mellitus without complication ([place])    boarderline  . GERD (gastroesophageal reflux disease)   . Hypertension   . Stroke [facility])      Assessment/Plan: 1 Day Post-Op Procedure(s) (LRB): TOTAL HIP ARTHROPLASTY (Left) Active Problems:   Status post total replacement of hip  Estimated body mass index is 39.22 kg/m as calculated from the following:   Height as of this encounter: 5\' 8"  (1.727 m).   Weight as of this encounter: 117 kg (257 lb 15 oz). Advance diet Up with therapy D/C IV fluids Plan for discharge tomorrow  Labs: Were reviewed and acceptable. DVT Prophylaxis - Lovenox, Foot Pumps and TED hose Weight-Bearing as tolerated to left leg D/C O2 and Pulse OX and try on Room Air Begin working on bowel movement  Jesus Bell R. [place] [facility] 08/18/2017, 7:15 AM

## 2017-08-18 NOTE — Clinical Social Work Note (Signed)
Clinical Social Work Assessment  Patient Details  Name: Jesus Bell MRN: [id number] Date of Birth: 1955-12-22  Date of referral:  08/18/17               Reason for consult:  Facility Placement                Permission sought to share information with:  Chartered certified accountant granted to share information::  Yes, Verbal Permission Granted  Name::      [place]::   [place]   Relationship::     Contact Information:     Housing/Transportation Living arrangements for the past 2 months:  [place] of Information:  Patient Patient Interpreter Needed:  None Criminal Activity/Legal Involvement Pertinent to Current Situation/Hospitalization:  No - Comment as needed Significant Relationships:  Spouse Lives with:  Spouse Do you feel safe going back to the place where you live?  Yes Need for family participation in patient care:  Yes (Comment)  Care giving concerns:  Patient lives in [place] with his wife Jesus Bell.    Social Worker assessment / plan:  Holiday representative (CSW) received SNF consult. PT is recommending SNF. CSW met with patient alone at bedside to discuss D/C plan. Patient was alert and oriented X4 and was laying in the bed. CSW introduced self and explained role of CSW department. Patient reported that he lives in [place] with his wife. CSW explained SNF process and that Mcarthur Rossetti will have to approve SNF. Patient verbalized his understanding and is agreeable to SNF search in [place]. FL2 complete and faxed out.   CSW presented SNF bed offers to patient. He chose [facility] Patient is agreeable to a semi-private room at [facility]. Per [facility] admissions coordinator at [facility] she will start [facility] authorization today. CSW will continue to follow and assist as needed.   Employment status:  Retired Nurse, adult PT Recommendations:  [place] / Referral to community resources:  [place]  Patient/Family's Response to care:  Patient is agreeable to going to [facility]   Patient/Family's Understanding of and Emotional Response to Diagnosis, Current Treatment, and Prognosis:  Patient was very pleasant and thanked CSW for assistance.   Emotional Assessment Appearance:  Appears stated age Attitude/Demeanor/Rapport:    Affect (typically observed):  Accepting, Adaptable, Pleasant Orientation:  Oriented to Self, Oriented to Place, Oriented to  Time, Oriented to Situation Alcohol / Substance use:  Not Applicable Psych involvement (Current and /or in the community):  No (Comment)  Discharge Needs  Concerns to be addressed:  Discharge Planning Concerns Readmission within the last 30 days:  No Current discharge risk:  Dependent with Mobility Barriers to Discharge:  Continued Medical Work up   [employer], Veronia Beets, LCSW 08/18/2017, 2:14 PM

## 2017-08-18 NOTE — Care Management Note (Signed)
Case Management Note  Patient Details  Name: Jesus Bell MRN: [id number] Date of Birth: 12/07/1955  Subjective/Objective:   POD # 1 left THA. Met with patient at bedside to discuss discharge planning. Patient lives at home with his wife. He is reluctant to go home because he states she has had a bilateral knee replacement and is unable to help him. He is also concerned that he cant get into the home due to steps. He is requesting rehab. Explained that his insurance will not cover SNF since he progressed well with PT and they are recommending home health PT. Offered choice of home health agencies. He would like to think about it and let me know. He will need a walker. Ordered from [facility]. He has a bsc in the home.  Pharmacy: [facility] [address] [phone]. Will call Lovenox prior to discharge. It is anticipated that patient will discharge home tomorrow.               Action/Plan: [facility] for rolling walker.   Expected Discharge Date:                  Expected Discharge Plan:  Home w Home Health Services  In-House Referral:     Discharge planning Services  CM Consult  Post Acute Care Choice:  Home Health, Durable Medical Equipment Choice offered to:  Patient  DME Arranged:  Walker rolling DME Agency:  [facility]  HH Arranged:  PT HH Agency:     Status of Service:  In process, will continue to follow  If discussed at Long Length of Stay Meetings, dates discussed:    Additional Comments:  Lisa M Jacobs, RN 08/18/2017, 8:58 AM  

## 2017-08-18 NOTE — Progress Notes (Signed)
Physical Therapy Treatment Patient Details Name: Jesus Bell MRN: [id number] DOB: June 08, 1956 Today's Date: 08/18/2017    History of Present Illness Pt is a 61 y/o M s/p L THA. PMH includes stroke.     PT Comments    Pt is making gradual progress towards goals and is limited secondary to pain and quick fatigue with exertion. Pt also reports history of B wrist surgeries, making it difficult to push from seated surface prior to transfer. Needs bed elevated for easier transfer. Good endurance with there-ex and pt appears motivated to perform therapy. Able to recall 2/3 precautions this date. Still demonstrates decreased safety awareness and highly recommend SNF transfer. Will continue to progress.   Follow Up Recommendations  SNF     Equipment Recommendations  Rolling walker with 5" wheels    Recommendations for Other Services       Precautions / Restrictions Precautions Precautions: Fall;Posterior Hip Precaution Booklet Issued: Yes (comment) Precaution Comments: able to recal 2/3 precautions this session Restrictions Weight Bearing Restrictions: Yes LLE Weight Bearing: Weight bearing as tolerated    Mobility  Bed Mobility Overal bed mobility: Needs Assistance Bed Mobility: Supine to Sit;Sit to Supine     Supine to sit: Mod assist Sit to supine: Max assist;+2 for physical assistance   General bed mobility comments: Needs significant assist for sliding L LE off bed. Cues for trunk sequencing. Needed +2 for assist back supine including scooting up towards [place].  Transfers Overall transfer level: Needs assistance Equipment used: Rolling walker (2 wheeled) Transfers: Sit to/from Stand Sit to Stand: From elevated surface;Mod assist         General transfer comment: Improved technique with only 1 assist this time. Limited ability to use B hands secondary to B wrist fractures and decreased ROM. Once standing, able to stand with upright  posture.  Ambulation/Gait Ambulation/Gait assistance: Min assist Ambulation Distance (Feet): 15 Feet Assistive device: Rolling walker (2 wheeled) Gait Pattern/deviations: Step-to pattern;Decreased stance time - left;Decreased step length - right     General Gait Details: slow gait pattern with standing rest break needed. Cues for sequencing or RW given. Pt with B UE fatigue quickly, limiting distance as pt with increased pain with Wbing on L side. Increased from 6/10 to 10/10 with movement   Stairs            Wheelchair Mobility    Modified Rankin (Stroke Patients Only)       Balance                                            Cognition Arousal/Alertness: Awake/alert Behavior During Therapy: WFL for tasks assessed/performed Overall Cognitive Status: Within Functional Limits for tasks assessed                                        Exercises Other Exercises Other Exercises: supine ther-ex performed on L LE including ankle pumps, quad sets, hip abd/add, and SAQ. all ther-ex performed x 12 reps with min assist.    General Comments        Pertinent Vitals/Pain Pain Assessment: 0-10 Pain Score: 6  Pain Location: L hip at rest Pain Descriptors / Indicators: Operative site guarding;Discomfort Pain Intervention(s): Limited activity within patient's tolerance    Home Living  Prior Function            PT Goals (current goals can now be found in the care plan section) Acute Rehab PT Goals Patient Stated Goal: to get stronger PT Goal Formulation: With patient Time For Goal Achievement: 08/31/17 Potential to Achieve Goals: Good Progress towards PT goals: Progressing toward goals    Frequency    BID      PT Plan Current plan remains appropriate    Co-evaluation              AM-PAC PT "6 Clicks" Daily Activity  Outcome Measure  Difficulty turning over in bed (including adjusting  bedclothes, sheets and blankets)?: Unable Difficulty moving from lying on back to sitting on the side of the bed? : Unable Difficulty sitting down on and standing up from a chair with arms (e.g., wheelchair, bedside commode, etc,.)?: Unable Help needed moving to and from a bed to chair (including a wheelchair)?: A Lot Help needed walking in hospital room?: A Lot Help needed climbing 3-5 steps with a railing? : A Lot 6 Click Score: 9    End of Session Equipment Utilized During Treatment: Gait belt Activity Tolerance: Patient limited by pain Patient left: with family/visitor present;with SCD's reapplied;Other (comment);in bed;with bed alarm set Nurse Communication: Mobility status PT Visit Diagnosis: Pain;Unsteadiness on feet (R26.81);Muscle weakness (generalized) (M62.81);Other abnormalities of gait and mobility (R26.89) Pain - Right/Left: Left Pain - part of body: Hip     Time: 7902-4097 PT Time Calculation (min) (ACUTE ONLY): 30 min  Charges:  $Gait Training: 8-22 mins $Therapeutic Exercise: 8-22 mins                    G Codes:  Functional Assessment Tool Used: Clinical judgement;AM-PAC 6 Clicks Basic Mobility Functional Limitation: Mobility: Walking and moving around Mobility: Walking and Moving Around Current Status (D5329): At least 60 percent but less than 80 percent impaired, limited or restricted Mobility: Walking and Moving Around Goal Status [phone]): At least 40 percent but less than 60 percent impaired, limited or restricted    Greggory Stallion, PT, DPT [phone]    Jesus Bell 08/18/2017, 2:57 PM

## 2017-08-18 NOTE — Clinical Social Work Placement (Signed)
   CLINICAL SOCIAL WORK PLACEMENT  NOTE  Date:  08/18/2017  Patient Details  Name: Jesus Bell MRN: [id number] Date of Birth: 1956-07-24  Clinical Social Work is seeking post-discharge placement for this patient at the [place] level of care (*CSW will initial, date and re-position this form in  chart as items are completed):  Yes   Patient/family provided with [place] Work Department's list of facilities offering this level of care within the geographic area requested by the patient (or if unable, by the patient's family).  Yes   Patient/family informed of their freedom to choose among providers that offer the needed level of care, that participate in Medicare, Medicaid or managed care program needed by the patient, have an available bed and are willing to accept the patient.  Yes   Patient/family informed of [facility]'s ownership interest in [facility] and [facility], as well as of the fact that they are under no obligation to receive care at these facilities.  PASRR submitted to EDS on 08/18/17     PASRR number received on 08/18/17     Existing PASRR number confirmed on       FL2 transmitted to all facilities in geographic area requested by pt/family on 08/18/17     FL2 transmitted to all facilities within larger geographic area on       Patient informed that his/her managed care company has contracts with or will negotiate with certain facilities, including the following:        Yes   Patient/family informed of bed offers received.  Patient chooses bed at [facility] )     Physician recommends and patient chooses bed at      Patient to be transferred to   on  .  Patient to be transferred to facility by       Patient family notified on   of transfer.  Name of family member notified:        PHYSICIAN       Additional Comment:    _______________________________________________ Allea Kassner, Veronia Beets,  LCSW 08/18/2017, 2:12 PM

## 2017-08-18 NOTE — NC FL2 (Signed)
[place] LEVEL OF CARE SCREENING TOOL     IDENTIFICATION  Patient Name: Jesus Bell Birthdate: 08/22/1956 Sex: male Admission Date (Current Location): 08/17/2017  [place] and [place] Number:  Engineering geologist and Address:  [facility], [address] [place], [place] [address]      Provider Number: [id number]  Attending Physician Name and Address:  Dereck Leep, MD  Relative Name and Phone Number:       Current Level of Care: Hospital Recommended Level of Care: [place] Prior Approval Number:    Date Approved/Denied:   PASRR Number: [id number])  Discharge Plan: SNF    Current Diagnoses: Patient Active Problem List   Diagnosis Date Noted  . Diabetes mellitus type 2, uncomplicated ([place]) 44/31/5400  . Hypertension 08/17/2017  . Status post total replacement of hip 08/17/2017  . Primary osteoarthritis of left hip 05/03/2017    Orientation RESPIRATION BLADDER Height & Weight     Self, Time, Situation, Place  Normal Continent Weight: 257 lb 15 oz (117 kg) Height:  5\' 8"  (172.7 cm)  BEHAVIORAL SYMPTOMS/MOOD NEUROLOGICAL BOWEL NUTRITION STATUS      Continent Diet(Diet: Low Sodium/ Heart Healthy )  AMBULATORY STATUS COMMUNICATION OF NEEDS Skin   Extensive Assist Verbally Surgical wounds(Incision: Left Hip. )                       Personal Care Assistance Level of Assistance  Bathing, Feeding, Dressing Bathing Assistance: Limited assistance Feeding assistance: Independent Dressing Assistance: Limited assistance     Functional Limitations Info  Sight, Hearing, Speech Sight Info: Adequate Hearing Info: Adequate Speech Info: Adequate    SPECIAL CARE FACTORS FREQUENCY  PT (By licensed PT), OT (By licensed OT)     PT Frequency: (5) OT Frequency: (5)            Contractures      Additional Factors Info  Code Status, Allergies Code Status Info: (Full Code. ) Allergies Info:  (Nsaids)           Current Medications (08/18/2017):  This is the current hospital active medication list Current Facility-Administered Medications  Medication Dose Route Frequency Provider Last Rate Last Dose  . 0.9 %  sodium chloride infusion   Intravenous Continuous Hooten, Laurice Record, MD 100 mL/hr at 08/17/17 2358    . acetaminophen (TYLENOL) tablet 650 mg  650 mg Oral Q4H PRN Hooten, Laurice Record, MD       Or  . acetaminophen (TYLENOL) suppository 650 mg  650 mg Rectal Q4H PRN Hooten, Laurice Record, MD      . alum & mag hydroxide-simeth (MAALOX/MYLANTA) 200-200-20 MG/5ML suspension 30 mL  30 mL Oral Q4H PRN Hooten, Laurice Record, MD      . amLODipine (NORVASC) tablet 10 mg  10 mg Oral Daily Hooten, Laurice Record, MD   10 mg at 08/18/17 0925  . bisacodyl (DULCOLAX) suppository 10 mg  10 mg Rectal Daily PRN Hooten, Laurice Record, MD      . diphenhydrAMINE (BENADRYL) 12.5 MG/5ML elixir 12.5-25 mg  12.5-25 mg Oral Q4H PRN Hooten, Laurice Record, MD      . enoxaparin (LOVENOX) injection 30 mg  30 mg Subcutaneous Q12H Hooten, Laurice Record, MD   30 mg at 08/18/17 0925  . ferrous sulfate tablet 325 mg  325 mg Oral BID WC Dereck Leep, MD   325 mg at 08/18/17 0925  . insulin aspart (novoLOG) injection 0-15 Units  0-15 Units Subcutaneous TID WC Hooten, Laurice Record, MD   3 Units at 08/18/17 [phone]  . magnesium hydroxide (MILK OF MAGNESIA) suspension 30 mL  30 mL Oral Daily PRN Hooten, Laurice Record, MD      . menthol-cetylpyridinium (CEPACOL) lozenge 3 mg  1 lozenge Oral PRN Hooten, Laurice Record, MD       Or  . phenol (CHLORASEPTIC) mouth spray 1 spray  1 spray Mouth/Throat PRN Hooten, Laurice Record, MD      . metFORMIN (GLUCOPHAGE) tablet 1,000 mg  1,000 mg Oral Q breakfast Hooten, Laurice Record, MD   1,000 mg at 08/18/17 0926  . metoCLOPramide (REGLAN) tablet 10 mg  10 mg Oral TID AC & HS Hooten, Laurice Record, MD   10 mg at 08/18/17 0925  . morphine 2 MG/ML injection 2 mg  2 mg Intravenous Q2H PRN Hooten, Laurice Record, MD      . ondansetron (ZOFRAN) tablet 4 mg  4 mg Oral  Q6H PRN Hooten, Laurice Record, MD       Or  . ondansetron (ZOFRAN) injection 4 mg  4 mg Intravenous Q6H PRN Hooten, Laurice Record, MD      . oxyCODONE (Oxy IR/ROXICODONE) immediate release tablet 10 mg  10 mg Oral Q3H PRN Dereck Leep, MD   10 mg at 08/18/17 0925  . oxyCODONE (Oxy IR/ROXICODONE) immediate release tablet 5 mg  5 mg Oral Q3H PRN Hooten, Laurice Record, MD      . pantoprazole (PROTONIX) EC tablet 40 mg  40 mg Oral BID Dereck Leep, MD   40 mg at 08/18/17 0925  . pravastatin (PRAVACHOL) tablet 40 mg  40 mg Oral q1800 Hooten, Laurice Record, MD      . senna-docusate (Senokot-S) tablet 1 tablet  1 tablet Oral BID Dereck Leep, MD   1 tablet at 08/18/17 0925  . sodium phosphate (FLEET) 7-19 GM/118ML enema 1 enema  1 enema Rectal Once PRN Hooten, Laurice Record, MD      . traMADol Veatrice Bourbon) tablet 50-100 mg  50-100 mg Oral Q4H PRN Hooten, Laurice Record, MD         Discharge Medications: Please see discharge summary for a list of discharge medications.  Relevant Imaging Results:  Relevant Lab Results:   Additional Information (SSN: [id number])  Stalin Gruenberg, Veronia Beets, LCSW

## 2017-08-18 NOTE — Progress Notes (Signed)
Patient Blood pressure running high. 156/92. Dr. Marry Guan aware

## 2017-08-18 NOTE — Progress Notes (Signed)
Physical Therapy Treatment Patient Details Name: SHERIDAN GETTEL MRN: [id number] DOB: Jan 26, 1956 Today's Date: 08/18/2017    History of Present Illness Pt is a 61 y/o M s/p L THA. PMH includes stroke.     PT Comments    Pt is making limited progress towards goals and is limited secondary to strength/pain. Pt needs significant assist for OOB mobility, even +2 for standing attempts. He reports pain at 10/10 at this time. Pain meds given prior to session, per patient. Unable to recall any post hip precautions, reminded pt for safety. Will continue to progress. Changing recommendation to SNF at this time secondary to stark performance difference compared to previous date.   Follow Up Recommendations  SNF     Equipment Recommendations  Rolling walker with 5" wheels    Recommendations for Other Services       Precautions / Restrictions Precautions Precautions: Fall;Posterior Hip Precaution Booklet Issued: Yes (comment) Precaution Comments: continued instruction on precautions. Unable to recall at beginning of session Restrictions Weight Bearing Restrictions: Yes LLE Weight Bearing: Weight bearing as tolerated    Mobility  Bed Mobility Overal bed mobility: Needs Assistance Bed Mobility: Supine to Sit     Supine to sit: Mod assist     General bed mobility comments: needs heavy assist for sequencing. Mod assist for L LE. Once seated at EOB, needs assist for positioning in order to maintain precautions.  Transfers Overall transfer level: Needs assistance Equipment used: Rolling walker (2 wheeled) Transfers: Sit to/from Stand Sit to Stand: From elevated surface;Mod assist;+2 physical assistance         General transfer comment: needed heavy assist for standing with pt not following cues for transfer despite verbal/tactile cues. Once standing, limited WBing noted through L LE.  Ambulation/Gait Ambulation/Gait assistance: Min assist Ambulation Distance (Feet): 5  Feet Assistive device: Rolling walker (2 wheeled) Gait Pattern/deviations: Step-to pattern;Decreased stance time - left;Decreased step length - right     General Gait Details: slow gait with limited step length and WBing on L side. RW used with heavy WB on B hands. Reports 10/10 pain with movement   Stairs            Wheelchair Mobility    Modified Rankin (Stroke Patients Only)       Balance                                            Cognition Arousal/Alertness: Awake/alert Behavior During Therapy: WFL for tasks assessed/performed Overall Cognitive Status: Within Functional Limits for tasks assessed                                        Exercises Other Exercises Other Exercises: Agreeable to limited there-ex as pt with breakfast in room and requesting to eat. Able to perform L LE ankle pumps, quad sets, and hip abd/add. All ther-ex x 10 reps. Will plan to progress in PM session    General Comments        Pertinent Vitals/Pain Pain Assessment: 0-10 Pain Score: 10-Worst pain ever Pain Location: L hip with mobility Pain Descriptors / Indicators: Operative site guarding;Discomfort Pain Intervention(s): Limited activity within patient's tolerance;Premedicated before session;Ice applied    Home Living  Prior Function            PT Goals (current goals can now be found in the care plan section) Acute Rehab PT Goals Patient Stated Goal: to get stronger PT Goal Formulation: With patient Time For Goal Achievement: 08/31/17 Potential to Achieve Goals: Good Progress towards PT goals: Progressing toward goals    Frequency    BID      PT Plan Discharge plan needs to be updated    Co-evaluation              AM-PAC PT "6 Clicks" Daily Activity  Outcome Measure  Difficulty turning over in bed (including adjusting bedclothes, sheets and blankets)?: Unable Difficulty moving from lying on  back to sitting on the side of the bed? : Unable Difficulty sitting down on and standing up from a chair with arms (e.g., wheelchair, bedside commode, etc,.)?: Unable Help needed moving to and from a bed to chair (including a wheelchair)?: A Lot Help needed walking in hospital room?: A Lot Help needed climbing 3-5 steps with a railing? : A Lot 6 Click Score: 9    End of Session Equipment Utilized During Treatment: Gait belt Activity Tolerance: Patient limited by pain Patient left: in chair;with call bell/phone within reach;with chair alarm set;with family/visitor present;with SCD's reapplied;Other (comment) Nurse Communication: Mobility status PT Visit Diagnosis: Pain;Unsteadiness on feet (R26.81);Muscle weakness (generalized) (M62.81);Other abnormalities of gait and mobility (R26.89) Pain - Right/Left: Left Pain - part of body: Hip     Time: 6579-0383 PT Time Calculation (min) (ACUTE ONLY): 21 min  Charges:  $Gait Training: 8-22 mins                    G Codes:  Functional Assessment Tool Used: Clinical judgement;AM-PAC 6 Clicks Basic Mobility Functional Limitation: Mobility: Walking and moving around Mobility: Walking and Moving Around Current Status (F3832): At least 60 percent but less than 80 percent impaired, limited or restricted Mobility: Walking and Moving Around Goal Status [phone]): At least 40 percent but less than 60 percent impaired, limited or restricted    Greggory Stallion, PT, DPT [phone]    Tiani Stanbery 08/18/2017, 9:34 AM

## 2017-08-18 NOTE — Evaluation (Signed)
Occupational Therapy Evaluation Patient Details Name: Jesus Bell MRN: [id number] DOB: [birth year] Today's Date: 08/18/2017    History of Present Illness Pt is a 61 y/o M s/p L THA. PMH includes stroke.    Clinical Impression   Pt seen for OT evaluation this date. Pt was independent prior to admission and eager to return to PLOF. Pt received seated in the recliner after working with PT. Pt reporting 6/10 L hip pain, with RN administering pain medication at start of OT session. Pt agreeable to therapy. Pt currently requires mod assist for LB ADL tasks with max verbal cues in order to maintain posterior THPs. Pt unable to recall any posterior THPs without cues despite review earlier this morning with PT. Pt educated in home/routines modifications, use of BSC frame over toilets as needed, AE/DME for LB ADL tasks, and functional transfer training. Pt verbalized understanding, but demonstrated very limited immediate and delayed recall of information. Extensive review of posterior THPs during this session.  Pt presents with pain, decreased strength/ROM in LLE, decreased knowledge of precautions, and decreased knowledge of AE/DME which are impacting pt's ability to perform functional mobility and self care tasks safely. Pt will benefit from additional skilled OT services to maximize recall and carry over of precautions and ADL techniques in order to maximize safety and functional independence. Recommend STR following hospitalization given current functional deficits and impairments. Will continue to progress while at the hospital.    Follow Up Recommendations  SNF    Equipment Recommendations  Other (comment)(TBD)    Recommendations for Other Services       Precautions / Restrictions Precautions Precautions: Fall;Posterior Hip Precaution Booklet Issued: Yes (comment) Precaution Comments: Extensive education in posterior THPs with multiple cues required Restrictions Weight Bearing Restrictions:  Yes LLE Weight Bearing: Weight bearing as tolerated      Mobility Bed Mobility     General bed mobility comments: deferred, pt up in recliner for session  Transfers         General transfer comment: deferred, pt reporting significant pain, just worked with PT. OT reviewed safe techniques in order to maintain posterior THPs with verbal instruction, visual demonstration, and verbal teach back. Pt with decreased recall and required additional cueing.    Balance Overall balance assessment: Needs assistance Sitting-balance support: No upper extremity supported;Feet supported Sitting balance-Leahy Scale: Good                                     ADL either performed or assessed with clinical judgement   ADL Overall ADL's : Needs assistance/impaired Eating/Feeding: Sitting;Set up   Grooming: Sitting;Set up   Upper Body Bathing: Sitting;Set up;Supervision/ safety   Lower Body Bathing: Sitting/lateral leans;Moderate assistance   Upper Body Dressing : Set up;Sitting;Supervision/safety   Lower Body Dressing: Sitting/lateral leans;Moderate assistance;Maximal assistance   Toilet Transfer: RW;BSC;Stand-pivot;Moderate assistance;Maximal assistance             General ADL Comments: pt generally mod assist for LB ADL tasks with VC to maintain precautions with decreased recall and carryover noted     Vision Baseline Vision/History: No visual deficits Patient Visual Report: No change from baseline       Perception     Praxis      Pertinent Vitals/Pain Pain Assessment: 0-10 Pain Score: 6  Pain Location: L hip at rest Pain Descriptors / Indicators: Operative site guarding;Discomfort Pain Intervention(s): Limited activity within patient's tolerance;RN  gave pain meds during session;Monitored during session     Hand Dominance     Extremity/Trunk Assessment Upper Extremity Assessment Upper Extremity Assessment: Overall WFL for tasks assessed   Lower  Extremity Assessment Lower Extremity Assessment: LLE deficits/detail;Defer to PT evaluation   Cervical / Trunk Assessment Cervical / Trunk Assessment: Normal   Communication Communication Communication: No difficulties   Cognition Arousal/Alertness: Awake/alert Behavior During Therapy: WFL for tasks assessed/performed Overall Cognitive Status: No family/caregiver present to determine baseline cognitive functioning                                 General Comments: grossly WFL, decreased immediate and delayed recall of precautions, no family present to determine whether this is baseline (noted hx of CVA in chart)   General Comments       Exercises Exercises: Other exercises Other Exercises Other Exercises: Pt educated in home/routines modifications, use of BSC frame over toilets as needed, AE/DME for LB ADL tasks, and functional transfer training. Pt verbalized understanding, but demonstrated limited immediate and delayed recall of information.    Shoulder Instructions      Home Living Family/patient expects to be discharged to:: Private residence Living Arrangements: Spouse/significant other Available Help at Discharge: Family;Available PRN/intermittently(wife works part time at [employer]) Type of Home: [employer] Access: Stairs to enter Technical brewer of Steps: 2 Entrance Stairs-Rails: Right Home Layout: One level     Bathroom Shower/Tub: Occupational psychologist: [place]: Bedside commode;Shower seat - built in;Grab bars - tub/shower;Wheelchair - manual;Hand held shower head;Other (comment)(lift Psychologist, occupational)          Prior Functioning/Environment Level of Independence: Independent        Comments: Pt ambulating without AD.  Independent with all ADLs. Retired. No falls.        OT Problem List: Decreased knowledge of use of DME or AE;Decreased knowledge of precautions;Decreased cognition;Decreased activity  tolerance;Decreased safety awareness      OT Treatment/Interventions: Self-care/ADL training;Therapeutic activities;Cognitive remediation/compensation;DME and/or AE instruction;Patient/family education;Therapeutic exercise    OT Goals(Current goals can be found in the care plan section) Acute Rehab OT Goals Patient Stated Goal: to get stronger OT Goal Formulation: With patient Time For Goal Achievement: 09/01/17 Potential to Achieve Goals: Good  OT Frequency: Min 1X/week   Barriers to D/C: Decreased caregiver support;Inaccessible home environment          Co-evaluation              AM-PAC PT "6 Clicks" Daily Activity     Outcome Measure Help from another person eating meals?: None Help from another person taking care of personal grooming?: None Help from another person toileting, which includes using toliet, bedpan, or urinal?: A Lot Help from another person bathing (including washing, rinsing, drying)?: A Lot Help from another person to put on and taking off regular upper body clothing?: A Little Help from another person to put on and taking off regular lower body clothing?: A Lot 6 Click Score: 17   End of Session    Activity Tolerance: Patient tolerated treatment well;Patient limited by pain Patient left: in chair;with call bell/phone within reach;with chair alarm set;with SCD's reapplied  OT Visit Diagnosis: Other abnormalities of gait and mobility (R26.89)                Time: 1027-2536 OT Time Calculation (min): 19 min Charges:  OT General Charges $  OT Visit: 1 Visit OT Evaluation $OT Eval Low Complexity: 1 Low OT Treatments $Self Care/Home Management : 8-22 mins G-Codes: OT G-codes **NOT FOR INPATIENT CLASS** Functional Assessment Tool Used: AM-PAC 6 Clicks Daily Activity;Clinical judgement Functional Limitation: Self care Self Care Current Status (U1222): At least 40 percent but less than 60 percent impaired, limited or restricted Self Care Goal Status  (I1146): At least 20 percent but less than 40 percent impaired, limited or restricted   Jeni Salles, MPH, MS, OTR/L ascom [phone] 08/18/17, 10:18 AM

## 2017-08-18 NOTE — Discharge Summary (Signed)
Physician Discharge Summary  Patient ID: Jesus Bell MRN: [id number] DOB/AGE: 04-19-1956 61 y.o.  Admit date: 08/17/2017 Discharge date: 08/19/2017  Admission Diagnoses:  PRIMARY OSTEOARTHRITIS OF LEFT HIP   Discharge Diagnoses: Patient Active Problem List   Diagnosis Date Noted  . Diabetes mellitus type 2, uncomplicated ([place]) 62/95/2841  . Hypertension 08/17/2017  . Status post total replacement of hip 08/17/2017  . Primary osteoarthritis of left hip 05/03/2017    Past Medical History:  Diagnosis Date  . Diabetes mellitus without complication ([place])    boarderline  . GERD (gastroesophageal reflux disease)   . Hypertension   . Stroke [facility])      Transfusion: No transfusions during this admission   Consultants (if any):   Discharged Condition: Improved  Hospital Course: [name] is an 61 y.o. male who was admitted 08/17/2017 with a diagnosis of degenerative arthrosis left hip and went to the operating room on 08/17/2017 and underwent the above named procedures.    Surgeries:Procedure(s): TOTAL HIP ARTHROPLASTY on 08/17/2017  PRE-OPERATIVE DIAGNOSIS: Degenerative arthrosis of the left hip, primary  POST-OPERATIVE DIAGNOSIS:  Same  PROCEDURE:  Left total hip arthroplasty  SURGEON:  Marciano Sequin. M.D.  ASSISTANT:  Vance Peper, PA (present and scrubbed throughout the case, critical for assistance with exposure, retraction, instrumentation, and closure)  ANESTHESIA: spinal  ESTIMATED BLOOD LOSS: 500 mL  FLUIDS REPLACED: 2800 mL of crystalloid  DRAINS: 2 medium drains to a Hemovac reservoir  IMPLANTS UTILIZED: DePuy 13.5 mm small stature AML femoral stem, 52 mm OD Pinnacle 100 acetabular component, neutral Pinnacle Altrx polyethylene insert, and a 36 mm M-SPEC +1.5 mm hip ball  INDICATIONS FOR SURGERY: Jesus Bell is a 61 y.o. year old male with a long history of progressive hip and groin  pain. X-rays demonstrated severe degenerative  changes. The patient had not seen any significant improvement despite conservative nonsurgical intervention. After discussion of the risks and benefits of surgical intervention, the patient expressed understanding of the risks benefits and agree with plans for total hip arthroplasty.   The risks, benefits, and alternatives were discussed at length including but not limited to the risks of infection, bleeding, nerve injury, stiffness, blood clots, the need for revision surgery, limb length inequality, dislocation, cardiopulmonary complications, among others, and they were willing to proceed.   Patient tolerated the surgery well. No complications .Patient was taken to PACU where she was stabilized and then transferred to the orthopedic floor.  Patient started on Lovenox 30 mg q 12 hrs. Foot pumps applied bilaterally at 80 mm hgb. Heels elevated off bed with rolled towels. No evidence of DVT. Calves non tender. Negative Homan. Physical therapy started on day #1 for gait training and transfer with OT starting on  day #1 for ADL and assisted devices. Patient has done well with therapy. Ambulated 80 feet upon being discharged.  Patient's IV And Foley were discontinued on day #1 with Hemovac being discontinued on day #2. Dressing was changed on day 2 prior to patient being discharged   He was given perioperative antibiotics:  Anti-infectives (From admission, onward)   Start     Dose/Rate Route Frequency Ordered Stop   08/17/17 1300  ceFAZolin (ANCEF) 2 g in dextrose 5 % 100 mL IVPB     2 g 240 mL/hr over 30 Minutes Intravenous Every 6 hours 08/17/17 1212 08/18/17 0713   08/17/17 1200  ceFAZolin (ANCEF) IVPB 2g/100 mL premix  Status:  Discontinued     2 g  200 mL/hr over 30 Minutes Intravenous Every 6 hours 08/17/17 1159 08/17/17 1212   08/17/17 0600  ceFAZolin (ANCEF) IVPB 2g/100 mL premix     2 g 200 mL/hr over 30 Minutes Intravenous On call to O.R. 08/16/17 2210 08/17/17 1950    .  He was  fitted with AV 1 compression foot pump devices, instructed on heel pumps, early ambulation, and fitted with TED stockings bilaterally for DVT prophylaxis.  He benefited maximally from the hospital stay and there were no complications.    Recent vital signs:  Vitals:   08/17/17 2357 08/18/17 0406  BP: (!) 151/91 (!) 156/92  Pulse: (!) 109 (!) 104  Resp: 19 19  Temp: 99.2 F (37.3 C) 99.1 F (37.3 C)  SpO2: 94% 95%    Recent laboratory studies:  Lab Results  Component Value Date   HGB 13.6 08/17/2017   HGB 13.7 08/06/2017   HGB 9.0 (L) 08/08/2007   Lab Results  Component Value Date   WBC 5.4 08/06/2017   PLT 292 08/06/2017   Lab Results  Component Value Date   INR 1.01 08/06/2017   Lab Results  Component Value Date   NA 135 08/18/2017   K 3.5 08/18/2017   CL 103 08/18/2017   CO2 24 08/18/2017   BUN 10 08/18/2017   CREATININE 0.83 08/18/2017   GLUCOSE 149 (H) 08/18/2017    Discharge Medications:   Allergies as of 08/18/2017      Reactions   Nsaids Other (See Comments)   History of hemorrhagic stroke. No nsaids or aspirin      Medication List    TAKE these medications   amLODipine 10 MG tablet Commonly known as:  NORVASC Take 10 mg daily by mouth.   amLODipine 10 MG tablet Commonly known as:  NORVASC Take 10 mg daily by mouth.   enoxaparin 30 MG/0.3ML injection Commonly known as:  LOVENOX Inject 0.3 mLs (30 mg total) into the skin every 12 (twelve) hours for 14 days.   lovastatin 40 MG tablet Commonly known as:  MEVACOR Take 40 mg every morning by mouth.   metFORMIN 1000 MG tablet Commonly known as:  GLUCOPHAGE Take 1,000 mg daily with breakfast by mouth.   omeprazole 40 MG capsule Commonly known as:  PRILOSEC Take 40 mg daily by mouth.   oxyCODONE 5 MG immediate release tablet Commonly known as:  Oxy IR/ROXICODONE Take 1 tablet (5 mg total) by mouth every 4 (four) hours as needed for moderate pain ((score 4 to 6)).   traMADol 50 MG  tablet Commonly known as:  ULTRAM Take 1-2 tablets (50-100 mg total) by mouth every 4 (four) hours as needed for moderate pain.   VIAGRA 100 MG tablet Generic drug:  sildenafil Take 100 mg by mouth once as needed for erectile dysfunction.            Durable Medical Equipment  (From admission, onward)        Start     Ordered   08/17/17 1200  DME Walker rolling  Once    Question:  Patient needs a walker to treat with the following condition  Answer:  S/P total hip arthroplasty   08/17/17 1159   08/17/17 1200  DME Bedside commode  Once    Question:  Patient needs a bedside commode to treat with the following condition  Answer:  S/P total hip arthroplasty   08/17/17 1159      Diagnostic Studies: Dg Hip Port Unilat With Pelvis 1v  Left  Result Date: 08/17/2017 CLINICAL DATA:  Post hip replacement EXAM: DG HIP (WITH OR WITHOUT PELVIS) 1V PORT LEFT COMPARISON:  03/23/2016 FINDINGS: The patient has undergone left total hip arthroplasty without evidence for dislocation or fracture. Surgical drains and clips overlie the soft tissues of the left hip. IMPRESSION: Status post left hip arthroplasty.  No adverse features. Electronically Signed   By: Nolon Nations M.D.   On: 08/17/2017 11:40    Disposition: 01-Home or Self Care  Discharge Instructions    Diet - low sodium heart healthy   Complete by:  As directed    Increase activity slowly   Complete by:  As directed       Follow-up Information    Hooten, Laurice Record, MD On 09/29/2017.   Specialty:  Orthopedic Surgery Why:  at 10:00am Contact information: [place] [address] [phone]            Signed: Watt Climes 08/18/2017, 7:23 AM

## 2017-08-18 NOTE — Care Management (Signed)
PT recommending SNF today following morning PT session. CSW aware. Walker canceled with [facility].

## 2017-08-19 DIAGNOSIS — R262 Difficulty in walking, not elsewhere classified: Secondary | ICD-10-CM | POA: Diagnosis not present

## 2017-08-19 DIAGNOSIS — M25552 Pain in left hip: Secondary | ICD-10-CM | POA: Diagnosis not present

## 2017-08-19 DIAGNOSIS — M1612 Unilateral primary osteoarthritis, left hip: Secondary | ICD-10-CM | POA: Diagnosis not present

## 2017-08-19 DIAGNOSIS — Z96642 Presence of left artificial hip joint: Secondary | ICD-10-CM | POA: Diagnosis not present

## 2017-08-19 DIAGNOSIS — Z7401 Bed confinement status: Secondary | ICD-10-CM | POA: Diagnosis not present

## 2017-08-19 DIAGNOSIS — E119 Type 2 diabetes mellitus without complications: Secondary | ICD-10-CM | POA: Diagnosis not present

## 2017-08-19 DIAGNOSIS — Z8673 Personal history of transient ischemic attack (TIA), and cerebral infarction without residual deficits: Secondary | ICD-10-CM | POA: Diagnosis not present

## 2017-08-19 DIAGNOSIS — E7800 Pure hypercholesterolemia, unspecified: Secondary | ICD-10-CM | POA: Diagnosis not present

## 2017-08-19 DIAGNOSIS — K219 Gastro-esophageal reflux disease without esophagitis: Secondary | ICD-10-CM | POA: Diagnosis not present

## 2017-08-19 DIAGNOSIS — I10 Essential (primary) hypertension: Secondary | ICD-10-CM | POA: Diagnosis not present

## 2017-08-19 DIAGNOSIS — M6281 Muscle weakness (generalized): Secondary | ICD-10-CM | POA: Diagnosis not present

## 2017-08-19 DIAGNOSIS — Z471 Aftercare following joint replacement surgery: Secondary | ICD-10-CM | POA: Diagnosis not present

## 2017-08-19 DIAGNOSIS — Z7984 Long term (current) use of oral hypoglycemic drugs: Secondary | ICD-10-CM | POA: Diagnosis not present

## 2017-08-19 LAB — BASIC METABOLIC PANEL
ANION GAP: 9 (ref 5–15)
BUN: 12 mg/dL (ref 6–20)
CO2: 26 mmol/L (ref 22–32)
CREATININE: 0.94 mg/dL (ref 0.61–1.24)
Calcium: 8.8 mg/dL — ABNORMAL LOW (ref 8.9–10.3)
Chloride: 101 mmol/L (ref 101–111)
GFR calc Af Amer: >60 mL/min (ref >60)
GFR calc non Af Amer: >60 mL/min (ref >60)
GLUCOSE: 147 mg/dL — AB (ref 65–99)
POTASSIUM: 3.5 mmol/L (ref 3.5–5.1)
Sodium: 136 mmol/L (ref 135–145)

## 2017-08-19 LAB — SURGICAL PATHOLOGY

## 2017-08-19 LAB — GLUCOSE, CAPILLARY
Glucose-Capillary: 144 mg/dL — ABNORMAL HIGH (ref 65–99)
Glucose-Capillary: 151 mg/dL — ABNORMAL HIGH (ref 65–99)

## 2017-08-19 NOTE — Progress Notes (Signed)
Patient is being discharged today. IVs removed with caths intact. LBM, this shift. Waiting to eat until getting to [facility]. Waiting EMS trasport at this time. Signed script in paperwork. Patient aware.

## 2017-08-19 NOTE — Progress Notes (Signed)
   Subjective: 2 Days Post-Op Procedure(s) (LRB): TOTAL HIP ARTHROPLASTY (Left) Patient reports pain as 4 on 0-10 scale.   Patient is well, and has had no acute complaints or problems Did not do as well yesterday with physical therapy as he did today before. States that once the spinal wore off he is noted to have severe pain. Also having pain to the wrist from previous problems. Plan is to go Rehab after hospital stay. no nausea and no vomiting Patient denies any chest pains or shortness of breath. Objective: Vital signs in last 24 hours: Temp:  [98.5 F (36.9 C)-100.3 F (37.9 C)] 100.3 F (37.9 C) (11/28 0353) Pulse Rate:  [101-139] 120 (11/28 0353) Resp:  [16-20] 16 (11/28 0353) BP: (131-154)/(74-90) 131/87 (11/28 0353) SpO2:  [93 %-96 %] 94 % (11/28 0353) well approximated incision Heels are non tender and elevated off the bed using rolled towels Intake/Output from previous day: 11/27 0701 - 11/28 0700 In: 2983.3 [P.O.:960; I.V.:2023.3] Out: 1070 [Urine:950; Drains:120] Intake/Output this shift: Total I/O In: 480 [P.O.:480] Out: 120 [Drains:120]  Recent Labs    08/17/17 0622  HGB 13.6   Recent Labs    08/17/17 0622  HCT 40.0   Recent Labs    08/18/17 0353 08/19/17 0425  NA 135 136  K 3.5 3.5  CL 103 101  CO2 24 26  BUN 10 12  CREATININE 0.83 0.94  GLUCOSE 149* 147*  CALCIUM 8.4* 8.8*   No results for input(s): LABPT, INR in the last 72 hours.  EXAM General - Patient is Alert, Appropriate and Oriented Extremity - Neurologically intact Neurovascular intact Sensation intact distally Intact pulses distally Dorsiflexion/Plantar flexion intact No cellulitis present Compartment soft Dressing - scant drainage Motor Function - intact, moving foot and toes well on exam.    Past Medical History:  Diagnosis Date  . Diabetes mellitus without complication ([place])    boarderline  . GERD (gastroesophageal reflux disease)   . Hypertension   . Stroke [facility])      Assessment/Plan: 2 Days Post-Op Procedure(s) (LRB): TOTAL HIP ARTHROPLASTY (Left) Active Problems:   Status post total replacement of hip  Estimated body mass index is 39.22 kg/m as calculated from the following:   Height as of this encounter: 5\' 8"  (1.727 m).   Weight as of this encounter: 117 kg (257 lb 15 oz). Up with therapy Discharge to SNF  Labs: Were reviewed and acceptable DVT Prophylaxis - Lovenox, Foot Pumps and TED hose Weight-Bearing as tolerated to left leg Hemovac discontinued on today's visit Patient may be discharged to rehabilitation once he has a bowel movement. Please change dressing prior to patient being discharged  Keydi Giel R. Blandon Pentress 08/19/2017, 6:53 AM

## 2017-08-19 NOTE — Progress Notes (Signed)
Physical Therapy Treatment Patient Details Name: Jesus Bell MRN: [id number] DOB: 1956/06/08 Today's Date: 08/19/2017    History of Present Illness Pt is a 61 y/o M s/p L THA. PMH includes stroke.     PT Comments    Pt is making good progress towards goals with improved ambulation distance noted this date. Still limited by fatigue and pain, however able to demonstrate improvement with each session. Good endurance with there-ex. Still needs cues for recall of precautions and safety with transfers. Will continue to progress.   Follow Up Recommendations  SNF     Equipment Recommendations  Rolling walker with 5" wheels    Recommendations for Other Services       Precautions / Restrictions Precautions Precautions: Fall;Posterior Hip Precaution Booklet Issued: Yes (comment) Precaution Comments: able to recall 2/3 precautions this session Restrictions Weight Bearing Restrictions: Yes LLE Weight Bearing: Weight bearing as tolerated    Mobility  Bed Mobility               General bed mobility comments: not performed as pt received in recliner  Transfers Overall transfer level: Needs assistance Equipment used: Rolling walker (2 wheeled) Transfers: Sit to/from Stand Sit to Stand: Min assist         General transfer comment: improved technique from lower surface this date. Able to push easier from chair rails than from bed. Once standing, good balance noted  Ambulation/Gait Ambulation/Gait assistance: Min assist Ambulation Distance (Feet): 40 Feet Assistive device: Rolling walker (2 wheeled) Gait Pattern/deviations: Step-through pattern     General Gait Details: improved ambulation distance this session, although very slow gait speed noted. Improved step length on L foot. Pain increased with exertion.   Stairs            Wheelchair Mobility    Modified Rankin (Stroke Patients Only)       Balance                                             Cognition Arousal/Alertness: Awake/alert Behavior During Therapy: WFL for tasks assessed/performed Overall Cognitive Status: Within Functional Limits for tasks assessed                                        Exercises Other Exercises Other Exercises: seated ther-ex performed on L LE including ankle pumps, quad sets, hip abd/add, LAQ, and SAQ. All ther-ex performed x 15 reps with cga/min assist. Safe technique performed. Educated and reviewed HEP including frequency/duration.    General Comments        Pertinent Vitals/Pain Pain Assessment: 0-10 Pain Score: 8  Pain Location: L hip at rest Pain Descriptors / Indicators: Operative site guarding;Discomfort Pain Intervention(s): Limited activity within patient's tolerance;Ice applied    Home Living                      Prior Function            PT Goals (current goals can now be found in the care plan section) Acute Rehab PT Goals Patient Stated Goal: to get stronger PT Goal Formulation: With patient Time For Goal Achievement: 08/31/17 Potential to Achieve Goals: Good Progress towards PT goals: Progressing toward goals    Frequency    BID  PT Plan Current plan remains appropriate    Co-evaluation              AM-PAC PT "6 Clicks" Daily Activity  Outcome Measure  Difficulty turning over in bed (including adjusting bedclothes, sheets and blankets)?: Unable Difficulty moving from lying on back to sitting on the side of the bed? : Unable Difficulty sitting down on and standing up from a chair with arms (e.g., wheelchair, bedside commode, etc,.)?: Unable Help needed moving to and from a bed to chair (including a wheelchair)?: A Lot Help needed walking in hospital room?: A Lot Help needed climbing 3-5 steps with a railing? : A Lot 6 Click Score: 9    End of Session Equipment Utilized During Treatment: Gait belt Activity Tolerance: Patient limited by pain Patient left:  in chair;with chair alarm set;with SCD's reapplied Nurse Communication: Mobility status PT Visit Diagnosis: Pain;Unsteadiness on feet (R26.81);Muscle weakness (generalized) (M62.81);Other abnormalities of gait and mobility (R26.89) Pain - Right/Left: Left Pain - part of body: Hip     Time: 1022-1045 PT Time Calculation (min) (ACUTE ONLY): 23 min  Charges:  $Gait Training: 8-22 mins $Therapeutic Exercise: 8-22 mins                    G Codes:  Functional Assessment Tool Used: Clinical judgement;AM-PAC 6 Clicks Basic Mobility Functional Limitation: Mobility: Walking and moving around Mobility: Walking and Moving Around Current Status (H6073): At least 60 percent but less than 80 percent impaired, limited or restricted Mobility: Walking and Moving Around Goal Status [phone]): At least 40 percent but less than 60 percent impaired, limited or restricted    Jesus Bell, PT, DPT [phone]    Jesus Bell 08/19/2017, 11:29 AM

## 2017-08-19 NOTE — Clinical Social Work Placement (Signed)
   CLINICAL SOCIAL WORK PLACEMENT  NOTE  Date:  08/19/2017  Patient Details  Name: Jesus Bell MRN: [id number] Date of Birth: 1955-12-11  Clinical Social Work is seeking post-discharge placement for this patient at the [place] level of care (*CSW will initial, date and re-position this form in  chart as items are completed):  Yes   Patient/family provided with [place] Work Department's list of facilities offering this level of care within the geographic area requested by the patient (or if unable, by the patient's family).  Yes   Patient/family informed of their freedom to choose among providers that offer the needed level of care, that participate in Medicare, Medicaid or managed care program needed by the patient, have an available bed and are willing to accept the patient.  Yes   Patient/family informed of [facility]'s ownership interest in [facility] and [facility], as well as of the fact that they are under no obligation to receive care at these facilities.  PASRR submitted to EDS on 08/18/17     PASRR number received on 08/18/17     Existing PASRR number confirmed on       FL2 transmitted to all facilities in geographic area requested by pt/family on 08/18/17     FL2 transmitted to all facilities within larger geographic area on       Patient informed that his/her managed care company has contracts with or will negotiate with certain facilities, including the following:        Yes   Patient/family informed of bed offers received.  Patient chooses bed at [facility] )     Physician recommends and patient chooses bed at      Patient to be [facility] to [facility] ) on 08/19/17.  Patient to be transferred to facility by [facility] )     Patient family notified on 08/19/17 of transfer.  Name of family member notified:  (Patient's wife [name] is aware of D/C today. )     PHYSICIAN       Additional  Comment:    _______________________________________________ Aroldo Galli, Veronia Beets, LCSW 08/19/2017, 3:54 PM

## 2017-08-19 NOTE — Progress Notes (Signed)
Patient appears to have slept well with no signs of distress. Pain is controlled with current medications.  Up to bathroom with ease.  Milk of mag administered.

## 2017-08-19 NOTE — Plan of Care (Signed)
  Progressing Education: Knowledge of General Education information will improve 08/19/2017 0608 - Progressing by [facility] [place], Lucille Passy, RN Health Behavior/Discharge Planning: Ability to manage health-related needs will improve 08/19/2017 0608 - Progressing by Sarath Privott, Lucille Passy, RN Clinical Measurements: Ability to maintain clinical measurements within normal limits will improve 08/19/2017 0608 - Progressing by Tarry Blayney, Lucille Passy, RN Will remain free from infection 08/19/2017 [phone] - Progressing by Brannan Cassedy, Lucille Passy, RN Diagnostic test results will improve 08/19/2017 [phone] - Progressing by Kamisha Ell, Lucille Passy, RN Respiratory complications will improve 08/19/2017 [phone] - Progressing by Bryna Colander, RN Cardiovascular complication will be avoided 08/19/2017 0608 - Progressing by Hooria Gasparini, Lucille Passy, RN Activity: Risk for activity intolerance will decrease 08/19/2017 0608 - Progressing by Bryna Colander, RN Nutrition: Adequate nutrition will be maintained 08/19/2017 0608 - Progressing by Bryna Colander, RN Coping: Level of anxiety will decrease 08/19/2017 0608 - Progressing by Bryna Colander, RN Elimination: Will not experience complications related to bowel motility 08/19/2017 0608 - Progressing by Dinara Lupu, Lucille Passy, RN Will not experience complications related to urinary retention 08/19/2017 0608 - Progressing by Reynol Arnone, Lucille Passy, RN Pain Managment: General experience of comfort will improve 08/19/2017 [phone] - Progressing by Zach Tietje, Lucille Passy, RN Safety: Ability to remain free from injury will improve 08/19/2017 0608 - Progressing by Annissa Andreoni, Lucille Passy, RN Skin Integrity: Risk for impaired skin integrity will decrease 08/19/2017 0608 - Progressing by Bryna Colander, RN Education: Knowledge of the prescribed therapeutic regimen will improve 08/19/2017 0608 - Progressing by Jaleiah Asay, Lucille Passy, RN Understanding of discharge needs will  improve 08/19/2017 [phone] - Progressing by Crystin Lechtenberg, Lucille Passy, RN Activity: Ability to avoid complications of mobility impairment will improve 08/19/2017 0608 - Progressing by Bryna Colander, RN Ability to tolerate increased activity will improve 08/19/2017 5697 - Progressing by Bryna Colander, RN Clinical Measurements: Postoperative complications will be avoided or minimized 08/19/2017 0608 - Progressing by Bryna Colander, RN Pain Management: Pain level will decrease with appropriate interventions 08/19/2017 0608 - Progressing by Bryna Colander, RN Skin Integrity: Signs of wound healing will improve 08/19/2017 9480 - Progressing by Aariel Ems, Lucille Passy, RN

## 2017-08-19 NOTE — Progress Notes (Signed)
Patient is medically stable for D/C to [facility] today. Per [facility] admissions coordinator at [facility] patient can come today to room 210-B. RN will call report at [phone] and arrange EMS for transport. Per [facility] authorization has been received. Clinical Education officer, museum (CSW) sent D/C orders to [employer] [facility] via [employer]. Patient is aware of above. CSW contacted patient's wife [name] and made her aware of above. Please reconsult if future social work needs arise. CSW signing off.   [employer], LCSW [phone]

## 2017-08-20 ENCOUNTER — Other Ambulatory Visit: Payer: Self-pay

## 2017-08-20 DIAGNOSIS — E119 Type 2 diabetes mellitus without complications: Secondary | ICD-10-CM | POA: Diagnosis not present

## 2017-08-20 DIAGNOSIS — M1612 Unilateral primary osteoarthritis, left hip: Secondary | ICD-10-CM | POA: Diagnosis not present

## 2017-08-20 MED ORDER — TRAMADOL HCL 50 MG PO TABS: 50.0000 mg | ORAL_TABLET | ORAL | 1 refills | Status: DC | PRN

## 2017-08-20 NOTE — Telephone Encounter (Signed)
Rx sent to [facility] phone : [phone] , fax : [phone]  

## 2017-08-22 ENCOUNTER — Encounter
Admission: RE | Admit: 2017-08-22 | Discharge: 2017-08-22 | Disposition: A | Discharge status: Home / Self Care | Payer: Medicare HMO | Source: Ambulatory Visit | Attending: Internal Medicine | Admitting: Internal Medicine

## 2017-08-25 ENCOUNTER — Encounter: Payer: Self-pay | Admitting: Gerontology

## 2017-08-25 ENCOUNTER — Non-Acute Institutional Stay (SKILLED_NURSING_FACILITY): Payer: Medicare HMO | Admitting: Gerontology

## 2017-08-25 DIAGNOSIS — Z96642 Presence of left artificial hip joint: Secondary | ICD-10-CM

## 2017-08-25 DIAGNOSIS — M1612 Unilateral primary osteoarthritis, left hip: Secondary | ICD-10-CM | POA: Diagnosis not present

## 2017-08-25 NOTE — Progress Notes (Signed)
Location:   [place] [facility] Room Number: [place] of Service:  SNF [phone])  Provider: Toni Arthurs, NP-C  PCP: Wenda Low, MD Patient Care Team: Wenda Low, MD as PCP - General (Internal Medicine)  Extended Emergency Contact Information Primary Emergency Contact: Riggsbee,Joyce Address: [address]          [place], [place] [address] Johnnette Litter of [place] Phone: [phone] Mobile Phone: [phone] Relation: Spouse  Code Status: FULL Goals of care:  Advanced Directive information Advanced Directives 08/25/2017  Does Patient Have a Medical Advance Directive? No  Type of Advance Directive -  Does patient want to make changes to medical advance directive? No - Patient declined  Copy of [place] in Chart? -  Would patient like information on creating a medical advance directive? -     Allergies  Allergen Reactions  . Nsaids Other (See Comments)    History of hemorrhagic stroke. No nsaids or aspirin     Chief Complaint  Patient presents with  . Discharge Note    Discharged from SNF    HPI:  62 y.o. male seen today for discharge evaluation.  Patient was admitted to the facility for rehab after admission to [facility] for a left total hip replacement.  Patient has been participating in PT/OT.  Patient has been progressing well.  Patient reports pain is well controlled on current regimen.  Appetite good, voiding regularly, having regular BMs.   Patient is scheduled for discharge home tomorrow.  Patient reports he is feeling well and ready to go.  Vital signs stable.  No other complaints.    Past Medical History:  Diagnosis Date  . Chicken pox   . Diabetes mellitus type 2, uncomplicated ([place])   . Diabetes mellitus without complication ([place])    boarderline  . GERD (gastroesophageal reflux disease)   . Hypertension   . Stroke [facility])     Past Surgical History:  Procedure Laterality Date  . CLAVICLE SURGERY    .  SHOULDER SURGERY Bilateral   . TOTAL HIP ARTHROPLASTY Left 08/17/2017   Procedure: TOTAL HIP ARTHROPLASTY;  Surgeon: Dereck Leep, MD;  Location: [facility] ORS;  Service: Orthopedics;  Laterality: Left;  . WRIST FUSION Left       reports that  has never smoked. he has never used smokeless tobacco. He reports that he does not drink alcohol or use drugs. Social History   Socioeconomic History  . Marital status: Married    Spouse name: [name]  . Number of children: 4  . Years of education: 43  . Highest education level: High school graduate  Social Needs  . Financial resource strain: Not on file  . Food insecurity - worry: Not on file  . Food insecurity - inability: Not on file  . Transportation needs - medical: Not on file  . Transportation needs - non-medical: Not on file  Occupational History  . Not on file  Tobacco Use  . Smoking status: Never Smoker  . Smokeless tobacco: Never Used  Substance and Sexual Activity  . Alcohol use: No  . Drug use: No  . Sexual activity: Not on file  Other Topics Concern  . Not on file  Social History Narrative  . Not on file   Functional Status Survey:    Allergies  Allergen Reactions  . Nsaids Other (See Comments)    History of hemorrhagic stroke. No nsaids or aspirin     Pertinent  Health Maintenance  Due  Topic Date Due  . FOOT EXAM  06/10/1966  . OPHTHALMOLOGY EXAM  06/10/1966  . URINE MICROALBUMIN  06/10/1966  . COLONOSCOPY  06/10/2006  . INFLUENZA VACCINE  04/22/2017  . HEMOGLOBIN A1C  02/03/2018    Medications: Allergies as of 08/25/2017      Reactions   Nsaids Other (See Comments)   History of hemorrhagic stroke. No nsaids or aspirin      Medication List        Accurate as of 08/25/17  2:52 PM. Always use your most recent med list.          acetaminophen 325 MG tablet Commonly known as:  TYLENOL Take 650 mg by mouth every 4 (four) hours as needed. Pain or increased temp   amLODipine 10 MG tablet Commonly  known as:  NORVASC Take 10 mg by mouth daily.   enoxaparin 30 MG/0.3ML injection Commonly known as:  LOVENOX Inject 0.3 mLs (30 mg total) into the skin every 12 (twelve) hours for 14 days.   lovastatin 40 MG tablet Commonly known as:  MEVACOR Take 40 mg by mouth every morning.   metFORMIN 1000 MG tablet Commonly known as:  GLUCOPHAGE Take 1,000 mg by mouth daily with breakfast.   omeprazole 40 MG capsule Commonly known as:  PRILOSEC Take 40 mg by mouth daily.   oxyCODONE 5 MG immediate release tablet Commonly known as:  Oxy IR/ROXICODONE Take 1 tablet (5 mg total) by mouth every 4 (four) hours as needed for moderate pain ((score 4 to 6)).   traMADol 50 MG tablet Commonly known as:  ULTRAM Take 1-2 tablets (50-100 mg total) by mouth every 4 (four) hours as needed for moderate pain.   VIAGRA 100 MG tablet Generic drug:  sildenafil Take 100 mg by mouth once as needed for erectile dysfunction.       Review of Systems  Constitutional: Negative for activity change, appetite change, chills, diaphoresis and fever.  HENT: Negative for congestion, sneezing, sore throat, trouble swallowing and voice change.   Respiratory: Negative for apnea, cough, choking, chest tightness, shortness of breath and wheezing.   Cardiovascular: Negative for chest pain, palpitations and leg swelling.  Gastrointestinal: Negative for abdominal distention, abdominal pain, constipation, diarrhea and nausea.  Genitourinary: Negative for difficulty urinating, dysuria, frequency and urgency.  Musculoskeletal: Positive for arthralgias (typical arthritis). Negative for back pain, gait problem and myalgias.  Skin: Positive for wound. Negative for color change, pallor and rash.  Neurological: Negative for dizziness, tremors, syncope, speech difficulty, weakness, numbness and headaches.  Psychiatric/Behavioral: Negative for agitation and behavioral problems.  All other systems reviewed and are  negative.   Vitals:   08/25/17 1433  BP: 130/85  Pulse: (!) 101  Resp: 20  Temp: 98.9 F (37.2 C)  TempSrc: Oral  SpO2: 97%  Weight: 251 lb 9.6 oz (114.1 kg)  Height: 5\' 8"  (1.727 m)   Body mass index is 38.26 kg/m. Physical Exam  Constitutional: He is oriented to person, place, and time. Vital signs are normal. He appears well-developed and well-nourished. He is active and cooperative. He does not appear ill. No distress.  HENT:  Head: Normocephalic and atraumatic.  Mouth/Throat: Uvula is midline, oropharynx is clear and moist and mucous membranes are normal. Mucous membranes are not pale, not dry and not cyanotic.  Eyes: Conjunctivae, EOM and lids are normal. Pupils are equal, round, and reactive to light.  Neck: Trachea normal, normal range of motion and full passive range of motion without  pain. Neck supple. No JVD present. No tracheal deviation, no edema and no erythema present. No thyromegaly present.  Cardiovascular: Normal rate, regular rhythm, normal heart sounds, intact distal pulses and normal pulses. Exam reveals no gallop, no distant heart sounds and no friction rub.  No murmur heard. Pulses:      Dorsalis pedis pulses are 2+ on the right side, and 2+ on the left side.  No edema  Pulmonary/Chest: Effort normal and breath sounds normal. No accessory muscle usage. No respiratory distress. He has no decreased breath sounds. He has no wheezes. He has no rhonchi. He has no rales. He exhibits no tenderness.  Abdominal: Soft. Normal appearance and bowel sounds are normal. He exhibits no distension and no ascites. There is no tenderness.  Musculoskeletal: He exhibits no edema or tenderness.       Left hip: He exhibits decreased range of motion and laceration.  Expected osteoarthritis, stiffness; calves soft, supple. Negative homan's sign  Neurological: He is alert and oriented to person, place, and time. He has normal strength.  Skin: Skin is warm and dry. Laceration noted. He  is not diaphoretic. No cyanosis. No pallor. Nails show no clubbing.  Psychiatric: He has a normal mood and affect. His speech is normal and behavior is normal. Judgment and thought content normal. Cognition and memory are normal.  Nursing note and vitals reviewed.   Labs reviewed: Basic Metabolic Panel: Recent Labs    08/06/17 1039 08/17/17 0622 08/18/17 0353 08/19/17 0425  NA 139 143 135 136  K 3.3* 3.7 3.5 3.5  CL 103  --  103 101  CO2 27  --  24 26  GLUCOSE 212* 130* 149* 147*  BUN 10  --  10 12  CREATININE 0.99  --  0.83 0.94  CALCIUM 9.1  --  8.4* 8.8*   Liver Function Tests: Recent Labs    08/06/17 1039  AST 26  ALT 24  ALKPHOS 105  BILITOT 0.6  PROT 7.3  ALBUMIN 4.2   No results for input(s): LIPASE, AMYLASE in the last 8760 hours. No results for input(s): AMMONIA in the last 8760 hours. CBC: Recent Labs    08/06/17 1039 08/17/17 0622  WBC 5.4  --   NEUTROABS 3.4  --   HGB 13.7 13.6  HCT 40.9 40.0  MCV 76.2*  --   PLT 292  --    Cardiac Enzymes: No results for input(s): CKTOTAL, CKMB, CKMBINDEX, TROPONINI in the last 8760 hours. BNP: Invalid input(s): POCBNP CBG: Recent Labs    08/18/17 2104 08/19/17 0746 08/19/17 1145  GLUCAP 161* 144* 151*    Procedures and Imaging Studies During Stay: Dg Hip Port Unilat With Pelvis 1v Left  Result Date: 08/17/2017 CLINICAL DATA:  Post hip replacement EXAM: DG HIP (WITH OR WITHOUT PELVIS) 1V PORT LEFT COMPARISON:  03/23/2016 FINDINGS: The patient has undergone left total hip arthroplasty without evidence for dislocation or fracture. Surgical drains and clips overlie the soft tissues of the left hip. IMPRESSION: Status post left hip arthroplasty.  No adverse features. Electronically Signed   By: Nolon Nations M.D.   On: 08/17/2017 11:40    Assessment/Plan:   1.  Primary osteoarthritis of left hip. 2.  Status post total replacement of left hip  Continue PT/OT  Continue exercises as taught by  PT/OT  Skin care per protocol  Ice pack to left hip As needed for pain or swelling   Continue oxycodone 5 mg p.o. every 4 hours as needed  pain #30, no refill  Continue Tylenol 650 mg p.o. every 4 hours as needed mild pain  Follow-up with Orthopedist as instructed after discharge for continuity of care  Patient is being discharged with the following home health services: HHPT/OT through [facility]  Patient is being discharged with the following durable medical equipment: RW    Patient has been advised to f/u with their PCP in 1-2 weeks to bring them up to date on their rehab stay.  Social services at facility was responsible for arranging this appointment.  Pt was provided with a 30 day supply of prescriptions for medications and refills must be obtained from their PCP.  For controlled substances, a more limited supply may be provided adequate until PCP appointment only.  Future labs/tests needed:    Family/ staff Communication:   Total Time:  Documentation:  Face to Face:  Family/Phone:  Vikki Ports, NP-C [facility] [address], [place] [address] Cell Phone (Mon-Fri 8am-5pm):  [phone] On Call:  [phone] & follow prompts after 5pm & weekends Office Phone:  [phone] Office Fax:  [phone]

## 2017-08-28 DIAGNOSIS — Z96642 Presence of left artificial hip joint: Secondary | ICD-10-CM | POA: Diagnosis not present

## 2017-08-28 DIAGNOSIS — Z7984 Long term (current) use of oral hypoglycemic drugs: Secondary | ICD-10-CM | POA: Diagnosis not present

## 2017-08-28 DIAGNOSIS — I10 Essential (primary) hypertension: Secondary | ICD-10-CM | POA: Diagnosis not present

## 2017-08-28 DIAGNOSIS — Z471 Aftercare following joint replacement surgery: Secondary | ICD-10-CM | POA: Diagnosis not present

## 2017-08-28 DIAGNOSIS — E119 Type 2 diabetes mellitus without complications: Secondary | ICD-10-CM | POA: Diagnosis not present

## 2017-08-28 DIAGNOSIS — Z8673 Personal history of transient ischemic attack (TIA), and cerebral infarction without residual deficits: Secondary | ICD-10-CM | POA: Diagnosis not present

## 2017-09-01 DIAGNOSIS — E119 Type 2 diabetes mellitus without complications: Secondary | ICD-10-CM | POA: Diagnosis not present

## 2017-09-01 DIAGNOSIS — I10 Essential (primary) hypertension: Secondary | ICD-10-CM | POA: Diagnosis not present

## 2017-09-01 DIAGNOSIS — Z96642 Presence of left artificial hip joint: Secondary | ICD-10-CM | POA: Diagnosis not present

## 2017-09-01 DIAGNOSIS — Z7984 Long term (current) use of oral hypoglycemic drugs: Secondary | ICD-10-CM | POA: Diagnosis not present

## 2017-09-01 DIAGNOSIS — Z8673 Personal history of transient ischemic attack (TIA), and cerebral infarction without residual deficits: Secondary | ICD-10-CM | POA: Diagnosis not present

## 2017-09-01 DIAGNOSIS — Z471 Aftercare following joint replacement surgery: Secondary | ICD-10-CM | POA: Diagnosis not present

## 2017-09-02 DIAGNOSIS — Z471 Aftercare following joint replacement surgery: Secondary | ICD-10-CM | POA: Diagnosis not present

## 2017-09-02 DIAGNOSIS — Z8673 Personal history of transient ischemic attack (TIA), and cerebral infarction without residual deficits: Secondary | ICD-10-CM | POA: Diagnosis not present

## 2017-09-02 DIAGNOSIS — E119 Type 2 diabetes mellitus without complications: Secondary | ICD-10-CM | POA: Diagnosis not present

## 2017-09-02 DIAGNOSIS — Z96642 Presence of left artificial hip joint: Secondary | ICD-10-CM | POA: Diagnosis not present

## 2017-09-02 DIAGNOSIS — Z7984 Long term (current) use of oral hypoglycemic drugs: Secondary | ICD-10-CM | POA: Diagnosis not present

## 2017-09-02 DIAGNOSIS — I10 Essential (primary) hypertension: Secondary | ICD-10-CM | POA: Diagnosis not present

## 2017-09-03 DIAGNOSIS — Z8673 Personal history of transient ischemic attack (TIA), and cerebral infarction without residual deficits: Secondary | ICD-10-CM | POA: Diagnosis not present

## 2017-09-03 DIAGNOSIS — Z96642 Presence of left artificial hip joint: Secondary | ICD-10-CM | POA: Diagnosis not present

## 2017-09-03 DIAGNOSIS — E119 Type 2 diabetes mellitus without complications: Secondary | ICD-10-CM | POA: Diagnosis not present

## 2017-09-03 DIAGNOSIS — Z7984 Long term (current) use of oral hypoglycemic drugs: Secondary | ICD-10-CM | POA: Diagnosis not present

## 2017-09-03 DIAGNOSIS — Z471 Aftercare following joint replacement surgery: Secondary | ICD-10-CM | POA: Diagnosis not present

## 2017-09-03 DIAGNOSIS — I10 Essential (primary) hypertension: Secondary | ICD-10-CM | POA: Diagnosis not present

## 2017-09-07 DIAGNOSIS — Z471 Aftercare following joint replacement surgery: Secondary | ICD-10-CM | POA: Diagnosis not present

## 2017-09-07 DIAGNOSIS — I10 Essential (primary) hypertension: Secondary | ICD-10-CM | POA: Diagnosis not present

## 2017-09-07 DIAGNOSIS — Z7984 Long term (current) use of oral hypoglycemic drugs: Secondary | ICD-10-CM | POA: Diagnosis not present

## 2017-09-07 DIAGNOSIS — Z96642 Presence of left artificial hip joint: Secondary | ICD-10-CM | POA: Diagnosis not present

## 2017-09-07 DIAGNOSIS — Z8673 Personal history of transient ischemic attack (TIA), and cerebral infarction without residual deficits: Secondary | ICD-10-CM | POA: Diagnosis not present

## 2017-09-07 DIAGNOSIS — E119 Type 2 diabetes mellitus without complications: Secondary | ICD-10-CM | POA: Diagnosis not present

## 2017-09-08 DIAGNOSIS — Z7984 Long term (current) use of oral hypoglycemic drugs: Secondary | ICD-10-CM | POA: Diagnosis not present

## 2017-09-08 DIAGNOSIS — Z8673 Personal history of transient ischemic attack (TIA), and cerebral infarction without residual deficits: Secondary | ICD-10-CM | POA: Diagnosis not present

## 2017-09-08 DIAGNOSIS — E119 Type 2 diabetes mellitus without complications: Secondary | ICD-10-CM | POA: Diagnosis not present

## 2017-09-08 DIAGNOSIS — Z96642 Presence of left artificial hip joint: Secondary | ICD-10-CM | POA: Diagnosis not present

## 2017-09-08 DIAGNOSIS — I10 Essential (primary) hypertension: Secondary | ICD-10-CM | POA: Diagnosis not present

## 2017-09-08 DIAGNOSIS — Z471 Aftercare following joint replacement surgery: Secondary | ICD-10-CM | POA: Diagnosis not present

## 2017-09-11 DIAGNOSIS — Z96642 Presence of left artificial hip joint: Secondary | ICD-10-CM | POA: Diagnosis not present

## 2017-09-11 DIAGNOSIS — E119 Type 2 diabetes mellitus without complications: Secondary | ICD-10-CM | POA: Diagnosis not present

## 2017-09-11 DIAGNOSIS — I10 Essential (primary) hypertension: Secondary | ICD-10-CM | POA: Diagnosis not present

## 2017-09-11 DIAGNOSIS — Z471 Aftercare following joint replacement surgery: Secondary | ICD-10-CM | POA: Diagnosis not present

## 2017-09-11 DIAGNOSIS — Z7984 Long term (current) use of oral hypoglycemic drugs: Secondary | ICD-10-CM | POA: Diagnosis not present

## 2017-09-11 DIAGNOSIS — Z8673 Personal history of transient ischemic attack (TIA), and cerebral infarction without residual deficits: Secondary | ICD-10-CM | POA: Diagnosis not present

## 2017-09-16 DIAGNOSIS — Z8673 Personal history of transient ischemic attack (TIA), and cerebral infarction without residual deficits: Secondary | ICD-10-CM | POA: Diagnosis not present

## 2017-09-16 DIAGNOSIS — Z96642 Presence of left artificial hip joint: Secondary | ICD-10-CM | POA: Diagnosis not present

## 2017-09-16 DIAGNOSIS — Z7984 Long term (current) use of oral hypoglycemic drugs: Secondary | ICD-10-CM | POA: Diagnosis not present

## 2017-09-16 DIAGNOSIS — I10 Essential (primary) hypertension: Secondary | ICD-10-CM | POA: Diagnosis not present

## 2017-09-16 DIAGNOSIS — E119 Type 2 diabetes mellitus without complications: Secondary | ICD-10-CM | POA: Diagnosis not present

## 2017-09-16 DIAGNOSIS — Z471 Aftercare following joint replacement surgery: Secondary | ICD-10-CM | POA: Diagnosis not present

## 2017-09-28 DIAGNOSIS — Z471 Aftercare following joint replacement surgery: Secondary | ICD-10-CM | POA: Diagnosis not present

## 2017-09-29 DIAGNOSIS — Z96642 Presence of left artificial hip joint: Secondary | ICD-10-CM | POA: Diagnosis not present

## 2017-12-03 DIAGNOSIS — H2513 Age-related nuclear cataract, bilateral: Secondary | ICD-10-CM | POA: Diagnosis not present

## 2017-12-27 DIAGNOSIS — M7989 Other specified soft tissue disorders: Secondary | ICD-10-CM | POA: Diagnosis not present

## 2017-12-27 DIAGNOSIS — L03012 Cellulitis of left finger: Secondary | ICD-10-CM | POA: Diagnosis not present

## 2018-01-01 DIAGNOSIS — I10 Essential (primary) hypertension: Secondary | ICD-10-CM | POA: Diagnosis not present

## 2018-01-01 DIAGNOSIS — M79645 Pain in left finger(s): Secondary | ICD-10-CM | POA: Diagnosis not present

## 2018-01-01 DIAGNOSIS — M7989 Other specified soft tissue disorders: Secondary | ICD-10-CM | POA: Diagnosis not present

## 2018-01-01 DIAGNOSIS — Z8673 Personal history of transient ischemic attack (TIA), and cerebral infarction without residual deficits: Secondary | ICD-10-CM | POA: Diagnosis not present

## 2018-01-20 DIAGNOSIS — M659 Synovitis and tenosynovitis, unspecified: Secondary | ICD-10-CM | POA: Diagnosis not present

## 2018-01-20 DIAGNOSIS — M19042 Primary osteoarthritis, left hand: Secondary | ICD-10-CM | POA: Diagnosis not present

## 2018-02-04 DIAGNOSIS — Z7984 Long term (current) use of oral hypoglycemic drugs: Secondary | ICD-10-CM | POA: Diagnosis not present

## 2018-02-04 DIAGNOSIS — I10 Essential (primary) hypertension: Secondary | ICD-10-CM | POA: Diagnosis not present

## 2018-02-04 DIAGNOSIS — E7800 Pure hypercholesterolemia, unspecified: Secondary | ICD-10-CM | POA: Diagnosis not present

## 2018-02-04 DIAGNOSIS — E1169 Type 2 diabetes mellitus with other specified complication: Secondary | ICD-10-CM | POA: Diagnosis not present

## 2018-02-04 DIAGNOSIS — M7989 Other specified soft tissue disorders: Secondary | ICD-10-CM | POA: Diagnosis not present

## 2018-02-04 DIAGNOSIS — K219 Gastro-esophageal reflux disease without esophagitis: Secondary | ICD-10-CM | POA: Diagnosis not present

## 2018-02-24 DIAGNOSIS — R600 Localized edema: Secondary | ICD-10-CM | POA: Diagnosis not present

## 2018-08-10 DIAGNOSIS — I10 Essential (primary) hypertension: Secondary | ICD-10-CM | POA: Diagnosis not present

## 2018-08-10 DIAGNOSIS — Z1389 Encounter for screening for other disorder: Secondary | ICD-10-CM | POA: Diagnosis not present

## 2018-08-10 DIAGNOSIS — Z23 Encounter for immunization: Secondary | ICD-10-CM | POA: Diagnosis not present

## 2018-08-10 DIAGNOSIS — M169 Osteoarthritis of hip, unspecified: Secondary | ICD-10-CM | POA: Diagnosis not present

## 2018-08-10 DIAGNOSIS — E1169 Type 2 diabetes mellitus with other specified complication: Secondary | ICD-10-CM | POA: Diagnosis not present

## 2018-08-10 DIAGNOSIS — N400 Enlarged prostate without lower urinary tract symptoms: Secondary | ICD-10-CM | POA: Diagnosis not present

## 2018-08-10 DIAGNOSIS — Z0000 Encounter for general adult medical examination without abnormal findings: Secondary | ICD-10-CM | POA: Diagnosis not present

## 2018-08-10 DIAGNOSIS — N529 Male erectile dysfunction, unspecified: Secondary | ICD-10-CM | POA: Diagnosis not present

## 2018-08-10 DIAGNOSIS — E7800 Pure hypercholesterolemia, unspecified: Secondary | ICD-10-CM | POA: Diagnosis not present

## 2018-08-10 DIAGNOSIS — K219 Gastro-esophageal reflux disease without esophagitis: Secondary | ICD-10-CM | POA: Diagnosis not present

## 2019-12-08 ENCOUNTER — Ambulatory Visit: Payer: Medicare Other | Attending: Internal Medicine

## 2019-12-08 DIAGNOSIS — Z23 Encounter for immunization: Secondary | ICD-10-CM

## 2019-12-08 NOTE — Progress Notes (Signed)
   Covid-19 Vaccination Clinic  Name:  SAIFAN ZUO    MRN: [id number] DOB: [birth year]  12/08/2019  Mr. [place] was observed post Covid-19 immunization for 15 minutes without incident. He was provided with Vaccine Information Sheet and instruction to access the V-Safe system.   Mr. Avera was instructed to call 911 with any severe reactions post vaccine: Marland Kitchen Difficulty breathing  . Swelling of face and throat  . A fast heartbeat  . A bad rash all over body  . Dizziness and weakness   Immunizations Administered    Name Date Dose VIS Date Route   Pfizer COVID-19 Vaccine 12/08/2019 11:25 AM 0.3 mL 09/02/2019 Intramuscular   Manufacturer: [place]   Lot: [id number]   [place]: [id number]

## 2020-01-02 ENCOUNTER — Ambulatory Visit: Payer: Medicare Other | Attending: Internal Medicine

## 2020-01-02 DIAGNOSIS — Z23 Encounter for immunization: Secondary | ICD-10-CM

## 2020-01-02 NOTE — Progress Notes (Signed)
   Covid-19 Vaccination Clinic  Name:  Jesus Bell    MRN: [id number] DOB: [birth year]  01/02/2020  [name] was observed post Covid-19 immunization for 15 minutes without incident. He was provided with Vaccine Information Sheet and instruction to access the V-Safe system.   [name] was instructed to call 911 with any severe reactions post vaccine: Marland Kitchen Difficulty breathing  . Swelling of face and throat  . A fast heartbeat  . A bad rash all over body  . Dizziness and weakness   Immunizations Administered    Name Date Dose VIS Date Route   Pfizer COVID-19 Vaccine 01/02/2020  8:50 AM 0.3 mL 09/02/2019 Intramuscular   Manufacturer: [place]   Lot: [id number]   [place]: [id number]

## 2020-01-05 ENCOUNTER — Other Ambulatory Visit: Payer: Self-pay | Admitting: Podiatry

## 2020-01-05 ENCOUNTER — Ambulatory Visit: Payer: Medicare Other | Admitting: Podiatry

## 2020-01-05 ENCOUNTER — Ambulatory Visit (INDEPENDENT_AMBULATORY_CARE_PROVIDER_SITE_OTHER): Payer: Medicare Other

## 2020-01-05 ENCOUNTER — Other Ambulatory Visit: Payer: Self-pay

## 2020-01-05 ENCOUNTER — Encounter: Payer: Self-pay | Admitting: Podiatry

## 2020-01-05 DIAGNOSIS — M795 Residual foreign body in soft tissue: Secondary | ICD-10-CM

## 2020-01-05 DIAGNOSIS — M778 Other enthesopathies, not elsewhere classified: Secondary | ICD-10-CM

## 2020-01-06 ENCOUNTER — Encounter: Payer: Self-pay | Admitting: Podiatry

## 2020-01-06 NOTE — Progress Notes (Signed)
Subjective:  Patient ID: Jesus Bell, male    DOB: 1956/02/19,  MRN: [id number]  Chief Complaint  Patient presents with  . Foot Pain    Patient presents today with possible foreign object in left heel.  He states "I was getting out of shower last night and stepped and felt a sharp pain in my foot"    64 y.o. male presents with the above complaint.  Patient presents with right heel concern for possible foreign body.  Patient does not recall what he stepped on.  However he has sharp shooting pain at a very localized spot.  He states is very painful to touch.  He went to an urgent care where he had an x-ray done but did not show any underlying foreign body.  There is sharp stabbing nature to this.  He denies any other acute complaints.  He would like to make sure that there is no foreign body.  He is unable to see himself.  He denies any other acute complaints.  He is a diabetic with last A1c of 7.4.   Review of Systems: Negative except as noted in the HPI. Denies N/V/F/Ch.  Past Medical History:  Diagnosis Date  . Chicken pox   . Diabetes mellitus type 2, uncomplicated ([place])   . Diabetes mellitus without complication ([place])    boarderline  . GERD (gastroesophageal reflux disease)   . Hypertension   . Stroke [facility])     Current Outpatient Medications:  .  clobetasol ointment (TEMOVATE) 0.05 %, Apply topically., Disp: , Rfl:  .  acetaminophen (TYLENOL) 325 MG tablet, Take 650 mg by mouth every 4 (four) hours as needed. Pain or increased temp, Disp: , Rfl:  .  amLODipine (NORVASC) 10 MG tablet, Take 10 mg by mouth daily. , Disp: , Rfl:  .  lovastatin (MEVACOR) 40 MG tablet, Take 40 mg by mouth every morning. , Disp: , Rfl:  .  metFORMIN (GLUCOPHAGE) 1000 MG tablet, Take 1,000 mg by mouth daily with breakfast. , Disp: , Rfl:  .  omeprazole (PRILOSEC) 40 MG capsule, Take 40 mg by mouth daily. , Disp: , Rfl:  .  sildenafil (VIAGRA) 100 MG tablet, Take 100 mg by mouth once as needed for  erectile dysfunction., Disp: , Rfl:   Social History   Tobacco Use  Smoking Status Never Smoker  Smokeless Tobacco Never Used    Allergies  Allergen Reactions  . Nsaids Other (See Comments)    History of hemorrhagic stroke. No nsaids or aspirin   . Aspirin Other (See Comments)    States cannot take due to h/o hemorrhagic stroke States cannot take due to h/o hemorrhagic stroke    Objective:  There were no vitals filed for this visit. There is no height or weight on file to calculate BMI. Constitutional Well developed. Well nourished.  Vascular Dorsalis pedis pulses palpable bilaterally. Posterior tibial pulses palpable bilaterally. Capillary refill normal to all digits.  No cyanosis or clubbing noted. Pedal hair growth normal.  Neurologic Normal speech. Oriented to person, place, and time. Epicritic sensation to light touch grossly present bilaterally.  Dermatologic Nails well groomed and normal in appearance. No open wounds. No skin lesions.  Orthopedic:  Pain on palpation to the right plantar medial posterior heel.  Very localized to that area.  No pain with ankle range of motion.  No pain along the course of the posterior tibial tendon.  Possible medial branch of the calcaneal nerve entrapment.  Negative Tinel's sign  Radiographs: 3 views of skeletally mature adult right foot: No foreign body noted in the soft tissue.  No soft tissue emphysema noted.  No other bony abnormalities identified. Assessment:   1. Residual foreign body in soft tissue   2. Capsulitis of foot, right    Plan:  Patient was evaluated and treated and all questions answered.  Right heel capsulitis versus foreign body versus nerve entrapment -I explained to the patient the etiology of capsulitis with this relationship possibly to the foreign body versus a possible nerve entrapment of the medial branch calcaneal branch of the tibial nerve.  I believe patient will benefit from a steroid injection to  help decrease the pain associated with the inflammation that is going on.  I believe this is likely due to heel inflammation as opposed to any of the latter diagnosis.  Given that I do not see an entry point, this is less likely a foreign body involvement.  Patient agrees with the plan would like to proceed with a steroid injection. -A steroid injection was performed at right heel using 1% plain Lidocaine and 10 mg of Kenalog. This was well tolerated.   No follow-ups on file.

## 2020-02-14 ENCOUNTER — Ambulatory Visit: Payer: Medicare Other | Admitting: Podiatry

## 2020-02-21 ENCOUNTER — Other Ambulatory Visit: Payer: Self-pay | Admitting: Orthopedic Surgery

## 2020-02-21 DIAGNOSIS — M25552 Pain in left hip: Secondary | ICD-10-CM

## 2020-02-21 DIAGNOSIS — Z96642 Presence of left artificial hip joint: Secondary | ICD-10-CM

## 2020-02-28 ENCOUNTER — Encounter
Admission: RE | Admit: 2020-02-28 | Discharge: 2020-02-28 | Disposition: A | Discharge status: Home / Self Care | Payer: Medicare Other | Source: Ambulatory Visit | Attending: Orthopedic Surgery | Admitting: Orthopedic Surgery

## 2020-02-28 ENCOUNTER — Other Ambulatory Visit: Payer: Self-pay

## 2020-02-28 DIAGNOSIS — Z96642 Presence of left artificial hip joint: Secondary | ICD-10-CM | POA: Insufficient documentation

## 2020-02-28 DIAGNOSIS — M25552 Pain in left hip: Secondary | ICD-10-CM | POA: Diagnosis present

## 2020-02-28 MED ORDER — TECHNETIUM TC 99M MEDRONATE IV KIT
20.0000 | PACK | Freq: Once | INTRAVENOUS | Status: AC | PRN
Start: 2020-02-28 — End: 2020-02-28
  Administered 2020-02-28: 22.69 via INTRAVENOUS

## 2020-03-14 ENCOUNTER — Other Ambulatory Visit: Payer: Self-pay | Admitting: Orthopedic Surgery

## 2020-03-14 DIAGNOSIS — M5116 Intervertebral disc disorders with radiculopathy, lumbar region: Secondary | ICD-10-CM

## 2020-03-30 ENCOUNTER — Other Ambulatory Visit: Payer: Self-pay

## 2020-03-30 ENCOUNTER — Ambulatory Visit
Admission: RE | Admit: 2020-03-30 | Discharge: 2020-03-30 | Disposition: A | Discharge status: Home / Self Care | Payer: Medicare Other | Source: Ambulatory Visit | Attending: Orthopedic Surgery | Admitting: Orthopedic Surgery

## 2020-03-30 DIAGNOSIS — M5116 Intervertebral disc disorders with radiculopathy, lumbar region: Secondary | ICD-10-CM | POA: Diagnosis present

## 2020-04-18 NOTE — Progress Notes (Signed)
04/19/2020 8:36 AM   Jesus Bell [birth year] [id number]  Referring provider: Dereck Leep, MD [address],  [place] [address] Chief Complaint  Patient presents with  . Hydronephrosis    HPI: Jesus Bell is a 64 y.o. male who presents today to be evaluated and managed for left hydronephrosis and upper hydroureter.   Patient saw Dr. Marry Guan on 03/13/2020 for a re-evaluation of the left hip. Patient is 2-1/2 years s/p left total hip arthroplasty. He had onset groin and anterior thigh pain since 11/2019. Denied trauma. Weightbearing activities did not aggravate the pain. Pain improved with 10-15 minutes of walking. Patient was ambulating with a walker and cane secondary to left hip weakness. Denied gross numbness or bowel/bladder dysfunction. Lumbar spine MRI was ordered.   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 L5-S1 right subarticular recess narrowing that could affect the right S1 nerve root. Widely patent spinal canal and foramina. Left hydronephrosis that is new from 2008.  Patient is on Viagra 100 mg as needed for ED.   Patient denies left flank pain. Denies history of kidney stones. He has never had surgery of the abdomen.   Never smoker.    No previous imaging for comparison.  PMH: Past Medical History:  Diagnosis Date  . Chicken pox   . Diabetes mellitus type 2, uncomplicated ([place])   . Diabetes mellitus without complication ([place])    boarderline  . GERD (gastroesophageal reflux disease)   . Hypertension   . Stroke [facility])     Surgical History: Past Surgical History:  Procedure Laterality Date  . CLAVICLE SURGERY    . SHOULDER SURGERY Bilateral   . TOTAL HIP ARTHROPLASTY Left 08/17/2017   Procedure: TOTAL HIP ARTHROPLASTY;  Surgeon: Dereck Leep, MD;  Location: [facility] ORS;  Service: Orthopedics;  Laterality: Left;  . WRIST FUSION Left     Home Medications:  Allergies  as of 04/19/2020      Reactions   Nsaids Other (See Comments)   History of hemorrhagic stroke. No nsaids or aspirin   Aspirin Other (See Comments)   States cannot take due to h/o hemorrhagic stroke States cannot take due to h/o hemorrhagic stroke      Medication List       Accurate as of April 19, 2020  8:36 AM. If you have any questions, ask your nurse or doctor.        acetaminophen 325 MG tablet Commonly known as: TYLENOL Take 650 mg by mouth every 4 (four) hours as needed. Pain or increased temp   amLODipine 10 MG tablet Commonly known as: NORVASC Take 10 mg by mouth daily.   clobetasol ointment 0.05 % Commonly known as: TEMOVATE Apply topically.   lovastatin 40 MG tablet Commonly known as: MEVACOR Take 40 mg by mouth every morning.   metFORMIN 1000 MG tablet Commonly known as: GLUCOPHAGE Take 1,000 mg by mouth daily with breakfast.   neomycin-polymyxin b-dexamethasone 3.5-10000-0.1 Susp Commonly known as: MAXITROL Place 1 drop into the right eye 3 (three) times daily.   omeprazole 40 MG capsule Commonly known as: PRILOSEC Take 40 mg by mouth daily.   Viagra 100 MG tablet Generic drug: sildenafil Take 100 mg by mouth once as needed for erectile dysfunction.       Allergies:  Allergies  Allergen Reactions  . Nsaids Other (See Comments)    History of hemorrhagic stroke. No nsaids or aspirin   .  Aspirin Other (See Comments)    States cannot take due to h/o hemorrhagic stroke States cannot take due to h/o hemorrhagic stroke     Family History: No family history on file.  Social History:  reports that he has never smoked. He has never used smokeless tobacco. He reports that he does not drink alcohol and does not use drugs.   Physical Exam: BP (!) 153/94 (BP Location: Left Arm, Patient Position: Sitting, Cuff Size: Normal)   Pulse 87   Ht 5\' 8"  (1.727 m)   Wt (!) 251 lb 12.8 oz (114.2 kg)   BMI 38.29 kg/m   Constitutional:  Alert and oriented,  No acute distress. HEENT: [place] AT, moist mucus membranes.  Trachea midline, no masses. Cardiovascular: No clubbing, cyanosis, or edema. Respiratory: Normal respiratory effort, no increased work of breathing. Skin: No rashes, bruises or suspicious lesions. Neurologic: Grossly intact, no focal deficits, moving all 4 extremities. Psychiatric: Normal mood and affect.  UA reviewed, negative  Pertinent Imaging: CLINICAL DATA:  Low back pain with bilateral leg pain, worse on the left. Symptoms for 3-4 months  EXAM: MRI LUMBAR SPINE WITHOUT CONTRAST  TECHNIQUE: Multiplanar, multisequence MR imaging of the lumbar spine was performed. No intravenous contrast was administered.  COMPARISON:  None.  FINDINGS: Segmentation:  Standard lumbar numbering  Alignment:  Unremarkable  Vertebrae:  No fracture, evidence of discitis, or bone lesion.  Conus medullaris and cauda equina: Conus extends to the L1 level. Conus and cauda equina appear normal.  Paraspinal and other soft tissues: Right upper pole renal cyst. Left hydronephrosis and upper hydroureter that is new from a 2008 abdominal CT.  Disc levels:  T12- L1: Unremarkable.  L1-L2: Unremarkable.  L2-L3: Minor annulus bulging.  L3-L4: Mild facet spurring.  L4-L5: Facet osteoarthritis with moderate spurring. Mild disc bulging.  L5-S1:Facet osteoarthritis with greater spurring on the right where there is subarticular recess narrowing that could affect the descending S1 nerve root. Patent foramina.  IMPRESSION: 1. 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 2. L5-S1 right subarticular recess narrowing that could affect the right S1 nerve root. 3. Widely patent spinal canal and foramina. 4. Left hydronephrosis that is new from 2008, recommend imaging follow-up, preferably CT.   Electronically Signed   By: Monte Fantasia M.D.   On: 03/31/2020 08:08   I have personally reviewed the images and agree with  radiologist interpretation.     Assessment & Plan:    1. Hydronephrosis, left Incidental finding on lumbar spine MRI DDx discussed including stones, malignancy, stricture, inflammation, etc -CT hematuria ordered to eval  2. Erectile dysfunction Patient tried and failed Viagra.  We discussed the pathophysiology of erectile dysfunction today along with possible congestive any factors. Discussed possible treatment options including PDE 5 inhibitors, vacuum erectile device, intracavernosal injection, MUSE, and placement of the inflatable or malleable penile prosthesis for refractory cases.  He may be interested in [place], will discuss further at f/u visit    Follow up in 2 weeks for CT scan  [facility] [address], [place], [place] [address] [phone]  I, Selena Batten, am acting as a scribe for Dr. Hollice Espy.  I have reviewed the above documentation for accuracy and completeness, and I agree with the above.   Hollice Espy, MD

## 2020-04-19 ENCOUNTER — Other Ambulatory Visit: Payer: Self-pay

## 2020-04-19 ENCOUNTER — Ambulatory Visit (INDEPENDENT_AMBULATORY_CARE_PROVIDER_SITE_OTHER): Payer: Medicare Other | Admitting: Urology

## 2020-04-19 ENCOUNTER — Encounter: Payer: Self-pay | Admitting: Urology

## 2020-04-19 VITALS — BP 153/94 | HR 87 | Ht 68.0 in | Wt 251.8 lb

## 2020-04-19 DIAGNOSIS — N1330 Unspecified hydronephrosis: Secondary | ICD-10-CM

## 2020-04-20 ENCOUNTER — Telehealth: Payer: Self-pay | Admitting: *Deleted

## 2020-04-20 LAB — URINALYSIS, COMPLETE
Bilirubin, UA: NEGATIVE
Glucose, UA: NEGATIVE
Ketones, UA: NEGATIVE
Leukocytes,UA: NEGATIVE
Nitrite, UA: NEGATIVE
Protein,UA: NEGATIVE
RBC, UA: NEGATIVE
Specific Gravity, UA: 1.015 (ref 1.005–1.030)
Urobilinogen, Ur: 1 mg/dL (ref 0.2–1.0)
pH, UA: 6.5 (ref 5.0–7.5)

## 2020-04-20 LAB — BASIC METABOLIC PANEL
BUN/Creatinine Ratio: 9 — ABNORMAL LOW (ref 10–24)
BUN: 9 mg/dL (ref 8–27)
CO2: 26 mmol/L (ref 20–29)
Calcium: 9.7 mg/dL (ref 8.6–10.2)
Chloride: 102 mmol/L (ref 96–106)
Creatinine, Ser: 1.04 mg/dL (ref 0.76–1.27)
GFR calc Af Amer: 88 mL/min/{1.73_m2} (ref >59)
GFR calc non Af Amer: 76 mL/min/{1.73_m2} (ref >59)
Glucose: 133 mg/dL — ABNORMAL HIGH (ref 65–99)
Potassium: 3.9 mmol/L (ref 3.5–5.2)
Sodium: 142 mmol/L (ref 134–144)

## 2020-04-20 LAB — MICROSCOPIC EXAMINATION
Bacteria, UA: NONE SEEN
RBC: NONE SEEN /hpf (ref 0–2)

## 2020-04-20 NOTE — Telephone Encounter (Addendum)
Patient informed, voiced understanding.   ----- Message from Hollice Espy, MD sent at 04/20/2020 10:18 AM EDT ----- Labs look good, renal function is fine.  Hollice Espy, MD

## 2020-05-04 ENCOUNTER — Ambulatory Visit
Admission: RE | Admit: 2020-05-04 | Discharge: 2020-05-04 | Disposition: A | Discharge status: Home / Self Care | Payer: Medicare Other | Source: Ambulatory Visit | Attending: Urology | Admitting: Urology

## 2020-05-04 ENCOUNTER — Other Ambulatory Visit: Payer: Self-pay

## 2020-05-04 DIAGNOSIS — N1330 Unspecified hydronephrosis: Secondary | ICD-10-CM | POA: Insufficient documentation

## 2020-05-04 LAB — POCT I-STAT CREATININE: Creatinine, Ser: 1.2 mg/dL (ref 0.61–1.24)

## 2020-05-04 MED ORDER — IOHEXOL 300 MG/ML  SOLN
125.0000 mL | Freq: Once | INTRAMUSCULAR | Status: AC | PRN
  Administered 2020-05-04: 125 mL via INTRAVENOUS

## 2020-05-07 ENCOUNTER — Telehealth: Payer: Self-pay

## 2020-05-07 NOTE — Progress Notes (Signed)
05/08/2020 12:01 PM   Jesus Bell [birth year] [id number]  Referring provider: Wenda Low, MD [place] [employer] [address],  [place] [address] Chief Complaint  Patient presents with  . Hydronephrosis    HPI: Jesus Bell is a 64 y.o. male who presents today for a 3 week follow up of left hydronephrosis and erectile dysfunction.   Patient saw Dr. Marry Guan on 03/13/2020 for a re-evaluation of the left hip. Patient is 2-1/2 years s/p left total hip arthroplasty. He had onset groin and anterior thigh pain since 11/2019. Denied trauma. Weightbearing activities did not aggravate the pain. Pain improved with 10-15 minutes of walking. Patient was ambulating with a walker and cane secondary to left hip weakness. Denied gross numbness or bowel/bladder dysfunction. Lumbar spine MRI was ordered.   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 L5-S1 right subarticular recess narrowing that could affect the right S1 nerve root. Widely patent spinal canal and foramina. Left hydronephrosis that is new from 2008.  During last visit, patient was on viagra 100 mg prn for his ED. Patient denied left flank pain.  CT hematuria work up showed a 2.0 cm predominantly solid nodule in the right lower lobe, poorly evaluated but suspicious for primary bronchogenic neoplasm. There was a 6 mm proximal left ureteral calculus at the L4-5 level. Associated moderate left hydroureteronephrosis. Additional bilateral nonobstructing renal calculi measuring up to 3 mm.  Denies history of nephrolithiasis. Never has surgery of the abdomen.   Never smoker.  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   No previous imaging for comparison.    PMH: Past Medical History:  Diagnosis Date  . Chicken pox   . Diabetes mellitus type 2, uncomplicated ([place])   . Diabetes mellitus without complication ([place])    boarderline  . GERD  (gastroesophageal reflux disease)   . Hypertension   . Stroke [facility])     Surgical History: Past Surgical History:  Procedure Laterality Date  . CLAVICLE SURGERY    . SHOULDER SURGERY Bilateral   . TOTAL HIP ARTHROPLASTY Left 08/17/2017   Procedure: TOTAL HIP ARTHROPLASTY;  Surgeon: Dereck Leep, MD;  Location: [facility] ORS;  Service: Orthopedics;  Laterality: Left;  . WRIST FUSION Left     Home Medications:  Allergies as of 05/08/2020      Reactions   Nsaids Other (See Comments)   History of hemorrhagic stroke. No nsaids or aspirin   Aspirin Other (See Comments)   States cannot take due to h/o hemorrhagic stroke States cannot take due to h/o hemorrhagic stroke      Medication List       Accurate as of May 08, 2020 12:01 PM. If you have any questions, ask your nurse or doctor.        acetaminophen 325 MG tablet Commonly known as: TYLENOL Take 650 mg by mouth every 4 (four) hours as needed. Pain or increased temp   amLODipine 10 MG tablet Commonly known as: NORVASC Take 10 mg by mouth daily.   clobetasol ointment 0.05 % Commonly known as: TEMOVATE Apply topically.   lovastatin 40 MG tablet Commonly known as: MEVACOR Take 40 mg by mouth every morning.   metFORMIN 1000 MG tablet Commonly known as: GLUCOPHAGE Take 1,000 mg by mouth daily with breakfast.   neomycin-polymyxin b-dexamethasone 3.5-10000-0.1 Susp Commonly known as: MAXITROL Place 1 drop into the right eye 3 (three) times daily.  omeprazole 40 MG capsule Commonly known as: PRILOSEC Take 40 mg by mouth daily.   Viagra 100 MG tablet Generic drug: sildenafil Take 100 mg by mouth once as needed for erectile dysfunction.       Allergies:  Allergies  Allergen Reactions  . Nsaids Other (See Comments)    History of hemorrhagic stroke. No nsaids or aspirin   . Aspirin Other (See Comments)    States cannot take due to h/o hemorrhagic stroke States cannot take due to h/o hemorrhagic stroke      Family History: No family history on file.  Social History:  reports that he has never smoked. He has never used smokeless tobacco. He reports that he does not drink alcohol and does not use drugs.   Physical Exam: BP (!) 159/96   Pulse 92   Constitutional:  Alert and oriented, No acute distress. HEENT: [place] AT, moist mucus membranes.  Trachea midline, no masses. Cardiovascular: No clubbing, cyanosis, or edema. Respiratory: Normal respiratory effort, no increased work of breathing. Skin: No rashes, bruises or suspicious lesions. Neurologic: Grossly intact, no focal deficits, moving all 4 extremities. Psychiatric: Normal mood and affect.  Laboratory Data:  Lab Results  Component Value Date   CREATININE 1.20 05/04/2020   Lab Results  Component Value Date   HGBA1C 7.4 (H) 08/06/2017    Pertinent Imaging:  Results for orders placed during the hospital encounter of 05/04/20  CT HEMATURIA WORKUP  Narrative CLINICAL DATA:  Left hydronephrosis  EXAM: CT ABDOMEN AND PELVIS WITHOUT AND WITH CONTRAST  TECHNIQUE: Multidetector CT imaging of the abdomen and pelvis was performed following the standard protocol before and following the bolus administration of intravenous contrast.  CONTRAST:  126mL OMNIPAQUE IOHEXOL 300 MG/ML  SOLN  COMPARISON:  08/07/2007  FINDINGS: Lower chest: 2.0 x 1.8 cm predominantly solid nodule in the right lower lobe (series 3/image 7), poorly evaluated but suspicious for primary bronchogenic neoplasm.  Hepatobiliary: Moderate geographic hepatic steatosis. No suspicious/enhancing hepatic lesions.  Gallbladder is unremarkable. No intrahepatic or extrahepatic ductal dilatation.  Pancreas: Within normal limits.  Spleen: Within normal limits.  Adrenals/Urinary Tract: Adrenal glands are within normal limits.  Small bilateral renal cysts, measuring up to 13 mm in the medial right upper kidney (series 9/image 32).  Two punctate  nonobstructing right upper pole renal calculi (series 2/image 36). 3 mm nonobstructing left lower pole renal calculus (series 2/image 43). Moderate left hydroureteronephrosis with associated 6 mm proximal left ureteral calculus at the L4-5 level (series 5/image 96).  On delayed imaging, there are no filling defects in the bilateral proximal collecting systems, ureters, or bladder.  Bladder is within normal limits.  Stomach/Bowel: Stomach is within normal limits.  No evidence of bowel obstruction.  Normal appendix (series 9/image 52).  Scattered mild colonic diverticulosis, without evidence of diverticulitis.  Vascular/Lymphatic: No evidence of abdominal aortic aneurysm.  No suspicious abdominopelvic lymphadenopathy.  Reproductive: Prostate is grossly unremarkable.  Other: No abdominopelvic ascites.  Musculoskeletal: Degenerative changes of the visualized thoracolumbar spine. Left hip arthroplasty, without evidence of complication.  IMPRESSION: 2.0 cm predominantly solid nodule in the right lower lobe, poorly evaluated but suspicious for primary bronchogenic neoplasm. Consider dedicated CT chest or PET-CT for further evaluation.  6 mm proximal left ureteral calculus at the L4-5 level. Associated moderate left hydroureteronephrosis.  Additional bilateral nonobstructing renal calculi measuring up to 3 mm.  These results will be called to the ordering clinician or representative by the Radiologist Assistant, and communication documented in the PACS  or [employer].   Electronically Signed By: Julian Hy M.D. On: 05/04/2020 16:10   I have personally reviewed the images and agree with radiologist interpretation.    Assessment & Plan:    1. Left hydronephrosis/ ureteral calculus Secondary to obstructing ureteral calculus, based on chronicity is unlikely to pass the stone spontaneously as the bed is been in place for quite some time Patient is not a good  candidate for lithotripsy based on his habitus as well as stone density which is greater than 1500 Hounsfield units Risks and benefits of ureteroscopy were reviewed including but not limited to infection, bleeding, pain, ureteral injury which could require open surgery versus prolonged indwelling if ureteral perforation occurs, persistent stone disease, requirement for staged procedure, possible stent, and global anesthesia risks. Patient expressed understanding and desires to proceed with ureteroscopy. Preop UA/urine culture  2. Pulmonary nodule Finding on CTU concerning and suspicious for malignancy.  Whole body PET-scan ordered.  Will refer to oncology.  We discussed concern for malignancy, understands he would need further work-up and evaluation, all questions were answered today    [facility] [address] [place], [place] [address] [phone]  I, Selena Batten, am acting as a scribe for Dr. Hollice Espy.  I have reviewed the above documentation for accuracy and completeness, and I agree with the above.   Hollice Espy, MD

## 2020-05-07 NOTE — H&P (View-Only) (Signed)
05/08/2020 12:01 PM   Jesus Bell May 07, 1956 [id number]  Referring provider: Wenda Low, MD [place]. [employer] [address],  [place] [address] Chief Complaint  Patient presents with  . Hydronephrosis    HPI: Jesus Bell is a 64 y.o. male who presents today for a 3 week follow up of left hydronephrosis and erectile dysfunction.   Patient saw Dr. Marry Guan on 03/13/2020 for a re-evaluation of the left hip. Patient is 2-1/2 years s/p left total hip arthroplasty. He had onset groin and anterior thigh pain since 11/2019. Denied trauma. Weightbearing activities did not aggravate the pain. Pain improved with 10-15 minutes of walking. Patient was ambulating with a walker and cane secondary to left hip weakness. Denied gross numbness or bowel/bladder dysfunction. Lumbar spine MRI was ordered.   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 L5-S1 right subarticular recess narrowing that could affect the right S1 nerve root. Widely patent spinal canal and foramina. Left hydronephrosis that is new from 2008.  During last visit, patient was on viagra 100 mg prn for his ED. Patient denied left flank pain.  CT hematuria work up showed a 2.0 cm predominantly solid nodule in the right lower lobe, poorly evaluated but suspicious for primary bronchogenic neoplasm. There was a 6 mm proximal left ureteral calculus at the L4-5 level. Associated moderate left hydroureteronephrosis. Additional bilateral nonobstructing renal calculi measuring up to 3 mm.  Denies history of nephrolithiasis. Never has surgery of the abdomen.   Never smoker.  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   No previous imaging for comparison.    PMH: Past Medical History:  Diagnosis Date  . Chicken pox   . Diabetes mellitus type 2, uncomplicated ([place])   . Diabetes mellitus without complication ([place])    boarderline  . GERD  (gastroesophageal reflux disease)   . Hypertension   . Stroke [facility])     Surgical History: Past Surgical History:  Procedure Laterality Date  . CLAVICLE SURGERY    . SHOULDER SURGERY Bilateral   . TOTAL HIP ARTHROPLASTY Left 08/17/2017   Procedure: TOTAL HIP ARTHROPLASTY;  Surgeon: Dereck Leep, MD;  Location: [facility] ORS;  Service: Orthopedics;  Laterality: Left;  . WRIST FUSION Left     Home Medications:  Allergies as of 05/08/2020      Reactions   Nsaids Other (See Comments)   History of hemorrhagic stroke. No nsaids or aspirin   Aspirin Other (See Comments)   States cannot take due to h/o hemorrhagic stroke States cannot take due to h/o hemorrhagic stroke      Medication List       Accurate as of May 08, 2020 12:01 PM. If you have any questions, ask your nurse or doctor.        acetaminophen 325 MG tablet Commonly known as: TYLENOL Take 650 mg by mouth every 4 (four) hours as needed. Pain or increased temp   amLODipine 10 MG tablet Commonly known as: NORVASC Take 10 mg by mouth daily.   clobetasol ointment 0.05 % Commonly known as: TEMOVATE Apply topically.   lovastatin 40 MG tablet Commonly known as: MEVACOR Take 40 mg by mouth every morning.   metFORMIN 1000 MG tablet Commonly known as: GLUCOPHAGE Take 1,000 mg by mouth daily with breakfast.   neomycin-polymyxin b-dexamethasone 3.5-10000-0.1 Susp Commonly known as: MAXITROL Place 1 drop into the right eye 3 (three) times daily.  omeprazole 40 MG capsule Commonly known as: PRILOSEC Take 40 mg by mouth daily.   Viagra 100 MG tablet Generic drug: sildenafil Take 100 mg by mouth once as needed for erectile dysfunction.       Allergies:  Allergies  Allergen Reactions  . Nsaids Other (See Comments)    History of hemorrhagic stroke. No nsaids or aspirin   . Aspirin Other (See Comments)    States cannot take due to h/o hemorrhagic stroke States cannot take due to h/o hemorrhagic stroke      Family History: No family history on file.  Social History:  reports that he has never smoked. He has never used smokeless tobacco. He reports that he does not drink alcohol and does not use drugs.   Physical Exam: BP (!) 159/96   Pulse 92   Constitutional:  Alert and oriented, No acute distress. HEENT: [place] AT, moist mucus membranes.  Trachea midline, no masses. Cardiovascular: No clubbing, cyanosis, or edema. Respiratory: Normal respiratory effort, no increased work of breathing. Skin: No rashes, bruises or suspicious lesions. Neurologic: Grossly intact, no focal deficits, moving all 4 extremities. Psychiatric: Normal mood and affect.  Laboratory Data:  Lab Results  Component Value Date   CREATININE 1.20 05/04/2020   Lab Results  Component Value Date   HGBA1C 7.4 (H) 08/06/2017    Pertinent Imaging:  Results for orders placed during the hospital encounter of 05/04/20  CT HEMATURIA WORKUP  Narrative CLINICAL DATA:  Left hydronephrosis  EXAM: CT ABDOMEN AND PELVIS WITHOUT AND WITH CONTRAST  TECHNIQUE: Multidetector CT imaging of the abdomen and pelvis was performed following the standard protocol before and following the bolus administration of intravenous contrast.  CONTRAST:  120mL OMNIPAQUE IOHEXOL 300 MG/ML  SOLN  COMPARISON:  08/07/2007  FINDINGS: Lower chest: 2.0 x 1.8 cm predominantly solid nodule in the right lower lobe (series 3/image 7), poorly evaluated but suspicious for primary bronchogenic neoplasm.  Hepatobiliary: Moderate geographic hepatic steatosis. No suspicious/enhancing hepatic lesions.  Gallbladder is unremarkable. No intrahepatic or extrahepatic ductal dilatation.  Pancreas: Within normal limits.  Spleen: Within normal limits.  Adrenals/Urinary Tract: Adrenal glands are within normal limits.  Small bilateral renal cysts, measuring up to 13 mm in the medial right upper kidney (series 9/image 32).  Two punctate  nonobstructing right upper pole renal calculi (series 2/image 36). 3 mm nonobstructing left lower pole renal calculus (series 2/image 43). Moderate left hydroureteronephrosis with associated 6 mm proximal left ureteral calculus at the L4-5 level (series 5/image 96).  On delayed imaging, there are no filling defects in the bilateral proximal collecting systems, ureters, or bladder.  Bladder is within normal limits.  Stomach/Bowel: Stomach is within normal limits.  No evidence of bowel obstruction.  Normal appendix (series 9/image 52).  Scattered mild colonic diverticulosis, without evidence of diverticulitis.  Vascular/Lymphatic: No evidence of abdominal aortic aneurysm.  No suspicious abdominopelvic lymphadenopathy.  Reproductive: Prostate is grossly unremarkable.  Other: No abdominopelvic ascites.  Musculoskeletal: Degenerative changes of the visualized thoracolumbar spine. Left hip arthroplasty, without evidence of complication.  IMPRESSION: 2.0 cm predominantly solid nodule in the right lower lobe, poorly evaluated but suspicious for primary bronchogenic neoplasm. Consider dedicated CT chest or PET-CT for further evaluation.  6 mm proximal left ureteral calculus at the L4-5 level. Associated moderate left hydroureteronephrosis.  Additional bilateral nonobstructing renal calculi measuring up to 3 mm.  These results will be called to the ordering clinician or representative by the Radiologist Assistant, and communication documented in the PACS  or [employer].   Electronically Signed By: Julian Hy M.D. On: 05/04/2020 16:10   I have personally reviewed the images and agree with radiologist interpretation.    Assessment & Plan:    1. Left hydronephrosis/ ureteral calculus Secondary to obstructing ureteral calculus, based on chronicity is unlikely to pass the stone spontaneously as the bed is been in place for quite some time Patient is not a good  candidate for lithotripsy based on his habitus as well as stone density which is greater than 1500 Hounsfield units Risks and benefits of ureteroscopy were reviewed including but not limited to infection, bleeding, pain, ureteral injury which could require open surgery versus prolonged indwelling if ureteral perforation occurs, persistent stone disease, requirement for staged procedure, possible stent, and global anesthesia risks. Patient expressed understanding and desires to proceed with ureteroscopy. Preop UA/urine culture  2. Pulmonary nodule Finding on CTU concerning and suspicious for malignancy.  Whole body PET-scan ordered.  Will refer to oncology.  We discussed concern for malignancy, understands he would need further work-up and evaluation, all questions were answered today    [place] [address] [place], [place] [address] [phone]  I, Selena Batten, am acting as a scribe for Dr. Hollice Espy.  I have reviewed the above documentation for accuracy and completeness, and I agree with the above.   Hollice Espy, MD

## 2020-05-07 NOTE — Telephone Encounter (Signed)
Incoming call from [facility] Radiology noting the IMPRESSION from CT hematuria workup.

## 2020-05-07 NOTE — H&P (View-Only) (Signed)
05/08/2020 12:01 PM   Jesus Bell February 23, 1956 [id number]  Referring provider: Wenda Low, MD [place]. [employer] [address],  [place] [address] Chief Complaint  Patient presents with  . Hydronephrosis    HPI: Jesus Bell is a 64 y.o. male who presents today for a 3 week follow up of left hydronephrosis and erectile dysfunction.   Patient saw Dr. Marry Guan on 03/13/2020 for a re-evaluation of the left hip. Patient is 2-1/2 years s/p left total hip arthroplasty. He had onset groin and anterior thigh pain since 11/2019. Denied trauma. Weightbearing activities did not aggravate the pain. Pain improved with 10-15 minutes of walking. Patient was ambulating with a walker and cane secondary to left hip weakness. Denied gross numbness or bowel/bladder dysfunction. Lumbar spine MRI was ordered.   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 L5-S1 right subarticular recess narrowing that could affect the right S1 nerve root. Widely patent spinal canal and foramina. Left hydronephrosis that is new from 2008.  During last visit, patient was on viagra 100 mg prn for his ED. Patient denied left flank pain.  CT hematuria work up showed a 2.0 cm predominantly solid nodule in the right lower lobe, poorly evaluated but suspicious for primary bronchogenic neoplasm. There was a 6 mm proximal left ureteral calculus at the L4-5 level. Associated moderate left hydroureteronephrosis. Additional bilateral nonobstructing renal calculi measuring up to 3 mm.  Denies history of nephrolithiasis. Never has surgery of the abdomen.   Never smoker.  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   No previous imaging for comparison.    PMH: Past Medical History:  Diagnosis Date  . Chicken pox   . Diabetes mellitus type 2, uncomplicated ([place])   . Diabetes mellitus without complication ([place])    boarderline  . GERD  (gastroesophageal reflux disease)   . Hypertension   . Stroke [facility])     Surgical History: Past Surgical History:  Procedure Laterality Date  . CLAVICLE SURGERY    . SHOULDER SURGERY Bilateral   . TOTAL HIP ARTHROPLASTY Left 08/17/2017   Procedure: TOTAL HIP ARTHROPLASTY;  Surgeon: Dereck Leep, MD;  Location: [facility] ORS;  Service: Orthopedics;  Laterality: Left;  . WRIST FUSION Left     Home Medications:  Allergies as of 05/08/2020      Reactions   Nsaids Other (See Comments)   History of hemorrhagic stroke. No nsaids or aspirin   Aspirin Other (See Comments)   States cannot take due to h/o hemorrhagic stroke States cannot take due to h/o hemorrhagic stroke      Medication List       Accurate as of May 08, 2020 12:01 PM. If you have any questions, ask your nurse or doctor.        acetaminophen 325 MG tablet Commonly known as: TYLENOL Take 650 mg by mouth every 4 (four) hours as needed. Pain or increased temp   amLODipine 10 MG tablet Commonly known as: NORVASC Take 10 mg by mouth daily.   clobetasol ointment 0.05 % Commonly known as: TEMOVATE Apply topically.   lovastatin 40 MG tablet Commonly known as: MEVACOR Take 40 mg by mouth every morning.   metFORMIN 1000 MG tablet Commonly known as: GLUCOPHAGE Take 1,000 mg by mouth daily with breakfast.   neomycin-polymyxin b-dexamethasone 3.5-10000-0.1 Susp Commonly known as: MAXITROL Place 1 drop into the right eye 3 (three) times daily.  omeprazole 40 MG capsule Commonly known as: PRILOSEC Take 40 mg by mouth daily.   Viagra 100 MG tablet Generic drug: sildenafil Take 100 mg by mouth once as needed for erectile dysfunction.       Allergies:  Allergies  Allergen Reactions  . Nsaids Other (See Comments)    History of hemorrhagic stroke. No nsaids or aspirin   . Aspirin Other (See Comments)    States cannot take due to h/o hemorrhagic stroke States cannot take due to h/o hemorrhagic stroke      Family History: No family history on file.  Social History:  reports that he has never smoked. He has never used smokeless tobacco. He reports that he does not drink alcohol and does not use drugs.   Physical Exam: BP (!) 159/96   Pulse 92   Constitutional:  Alert and oriented, No acute distress. HEENT: [place] AT, moist mucus membranes.  Trachea midline, no masses. Cardiovascular: No clubbing, cyanosis, or edema. Respiratory: Normal respiratory effort, no increased work of breathing. Skin: No rashes, bruises or suspicious lesions. Neurologic: Grossly intact, no focal deficits, moving all 4 extremities. Psychiatric: Normal mood and affect.  Laboratory Data:  Lab Results  Component Value Date   CREATININE 1.20 05/04/2020   Lab Results  Component Value Date   HGBA1C 7.4 (H) 08/06/2017    Pertinent Imaging:  Results for orders placed during the hospital encounter of 05/04/20  CT HEMATURIA WORKUP  Narrative CLINICAL DATA:  Left hydronephrosis  EXAM: CT ABDOMEN AND PELVIS WITHOUT AND WITH CONTRAST  TECHNIQUE: Multidetector CT imaging of the abdomen and pelvis was performed following the standard protocol before and following the bolus administration of intravenous contrast.  CONTRAST:  134mL OMNIPAQUE IOHEXOL 300 MG/ML  SOLN  COMPARISON:  08/07/2007  FINDINGS: Lower chest: 2.0 x 1.8 cm predominantly solid nodule in the right lower lobe (series 3/image 7), poorly evaluated but suspicious for primary bronchogenic neoplasm.  Hepatobiliary: Moderate geographic hepatic steatosis. No suspicious/enhancing hepatic lesions.  Gallbladder is unremarkable. No intrahepatic or extrahepatic ductal dilatation.  Pancreas: Within normal limits.  Spleen: Within normal limits.  Adrenals/Urinary Tract: Adrenal glands are within normal limits.  Small bilateral renal cysts, measuring up to 13 mm in the medial right upper kidney (series 9/image 32).  Two punctate  nonobstructing right upper pole renal calculi (series 2/image 36). 3 mm nonobstructing left lower pole renal calculus (series 2/image 43). Moderate left hydroureteronephrosis with associated 6 mm proximal left ureteral calculus at the L4-5 level (series 5/image 96).  On delayed imaging, there are no filling defects in the bilateral proximal collecting systems, ureters, or bladder.  Bladder is within normal limits.  Stomach/Bowel: Stomach is within normal limits.  No evidence of bowel obstruction.  Normal appendix (series 9/image 52).  Scattered mild colonic diverticulosis, without evidence of diverticulitis.  Vascular/Lymphatic: No evidence of abdominal aortic aneurysm.  No suspicious abdominopelvic lymphadenopathy.  Reproductive: Prostate is grossly unremarkable.  Other: No abdominopelvic ascites.  Musculoskeletal: Degenerative changes of the visualized thoracolumbar spine. Left hip arthroplasty, without evidence of complication.  IMPRESSION: 2.0 cm predominantly solid nodule in the right lower lobe, poorly evaluated but suspicious for primary bronchogenic neoplasm. Consider dedicated CT chest or PET-CT for further evaluation.  6 mm proximal left ureteral calculus at the L4-5 level. Associated moderate left hydroureteronephrosis.  Additional bilateral nonobstructing renal calculi measuring up to 3 mm.  These results will be called to the ordering clinician or representative by the Radiologist Assistant, and communication documented in the PACS  or [employer].   Electronically Signed By: Julian Hy M.D. On: 05/04/2020 16:10   I have personally reviewed the images and agree with radiologist interpretation.    Assessment & Plan:    1. Left hydronephrosis/ ureteral calculus Secondary to obstructing ureteral calculus, based on chronicity is unlikely to pass the stone spontaneously as the bed is been in place for quite some time Patient is not a good  candidate for lithotripsy based on his habitus as well as stone density which is greater than 1500 Hounsfield units Risks and benefits of ureteroscopy were reviewed including but not limited to infection, bleeding, pain, ureteral injury which could require open surgery versus prolonged indwelling if ureteral perforation occurs, persistent stone disease, requirement for staged procedure, possible stent, and global anesthesia risks. Patient expressed understanding and desires to proceed with ureteroscopy. Preop UA/urine culture  2. Pulmonary nodule Finding on CTU concerning and suspicious for malignancy.  Whole body PET-scan ordered.  Will refer to oncology.  We discussed concern for malignancy, understands he would need further work-up and evaluation, all questions were answered today    [place] [address], [place], [place] [address] [phone]  I, Selena Batten, am acting as a scribe for Dr. Hollice Espy.  I have reviewed the above documentation for accuracy and completeness, and I agree with the above.   Hollice Espy, MD

## 2020-05-08 ENCOUNTER — Other Ambulatory Visit: Payer: Self-pay

## 2020-05-08 ENCOUNTER — Ambulatory Visit: Payer: Medicare Other | Admitting: Urology

## 2020-05-08 ENCOUNTER — Encounter: Payer: Self-pay | Admitting: Urology

## 2020-05-08 ENCOUNTER — Other Ambulatory Visit: Payer: Self-pay | Admitting: Radiology

## 2020-05-08 DIAGNOSIS — R918 Other nonspecific abnormal finding of lung field: Secondary | ICD-10-CM | POA: Diagnosis not present

## 2020-05-08 DIAGNOSIS — N201 Calculus of ureter: Secondary | ICD-10-CM

## 2020-05-08 LAB — MICROSCOPIC EXAMINATION: Bacteria, UA: NONE SEEN

## 2020-05-08 LAB — URINALYSIS, COMPLETE
Bilirubin, UA: NEGATIVE
Glucose, UA: NEGATIVE
Ketones, UA: NEGATIVE
Leukocytes,UA: NEGATIVE
Nitrite, UA: NEGATIVE
Protein,UA: NEGATIVE
Specific Gravity, UA: 1.01 (ref 1.005–1.030)
Urobilinogen, Ur: 0.2 mg/dL (ref 0.2–1.0)
pH, UA: 6.5 (ref 5.0–7.5)

## 2020-05-11 LAB — CULTURE, URINE COMPREHENSIVE

## 2020-05-14 ENCOUNTER — Other Ambulatory Visit: Payer: Self-pay | Admitting: *Deleted

## 2020-05-14 DIAGNOSIS — R911 Solitary pulmonary nodule: Secondary | ICD-10-CM

## 2020-05-15 ENCOUNTER — Other Ambulatory Visit
Admission: RE | Admit: 2020-05-15 | Discharge: 2020-05-15 | Disposition: A | Discharge status: Home / Self Care | Payer: Medicare Other | Source: Ambulatory Visit | Attending: Urology | Admitting: Urology

## 2020-05-15 ENCOUNTER — Other Ambulatory Visit: Payer: Self-pay

## 2020-05-15 HISTORY — DX: Male erectile dysfunction, unspecified: N52.9

## 2020-05-15 HISTORY — DX: Personal history of urinary calculi: Z87.442

## 2020-05-15 HISTORY — DX: Pure hypercholesterolemia, unspecified: E78.00

## 2020-05-15 NOTE — Patient Instructions (Addendum)
Your procedure is scheduled on:  Monday, August 30 Report to Day Surgery on the 2nd floor of the [facility]. To find out your arrival time, please call [phone] between 1PM - 3PM on: Friday, August 27  REMEMBER: Instructions that are not followed completely may result in serious medical risk, up to and including death; or upon the discretion of your surgeon and anesthesiologist your surgery may need to be rescheduled.  Do not eat food after midnight the night before surgery.  No gum chewing, lozengers or hard candies.  You may however, drink water up to 2 hours before you are scheduled to arrive for your surgery. Do not drink anything within 2 hours of your scheduled arrival time.  TAKE THESE MEDICATIONS THE MORNING OF SURGERY WITH A SIP OF WATER:  1.  Tylenol if needed for pain 2.  Amlodipine 3.  Lovastatin 4.  Omeprazole - (take one the night before and one on the morning of surgery - helps to prevent nausea after surgery.)  Stop Metformin 2 days prior to surgery. Last day to take is Friday, August 27; resume after surgery.  One week prior to surgery: Stop Anti-inflammatories (NSAIDS) such as Advil, Aleve, Ibuprofen, Motrin, Naproxen, Naprosyn and Aspirin based products such as Excedrin, Goodys Powder, BC Powder. Stop ANY OVER THE COUNTER supplements until after surgery. (You may continue taking Tylenol, Vitamin D.)  No Alcohol for 24 hours before or after surgery.  On the morning of surgery brush your teeth with toothpaste and water, you may rinse your mouth with mouthwash if you wish. Do not swallow any toothpaste or mouthwash.  Do not wear jewelry.  Do not wear lotions, powders, or perfumes.   Do not shave 48 hours prior to surgery.   Do not bring valuables to the hospital. [facility] is not responsible for any missing/lost belongings or valuables.   Notify your doctor if there is any change in your medical condition (cold, fever, infection).  Wear comfortable  clothing (specific to your surgery type) to the hospital.  Plan for stool softeners for home use; pain medications have a tendency to cause constipation. You can also help prevent constipation by eating foods high in fiber such as fruits and vegetables and drinking plenty of fluids as your diet allows.  After surgery, you can help prevent lung complications by doing breathing exercises.  Take deep breaths and cough every 1-2 hours. Your doctor may order a device called an Incentive Spirometer to help you take deep breaths.  If you are being discharged the day of surgery, you will not be allowed to drive home. You will need a responsible adult (18 years or older) to drive you home and stay with you that night.   If you are taking public transportation, you will need to have a responsible adult (18 years or older) with you. Please confirm with your physician that it is acceptable to use public transportation.   Please call the [facility] Dept[facility] at [phone] if you have any questions about these instructions.  Visitation Policy:  Patients undergoing a surgery or procedure may have one family member or support person with them as long as that person is not COVID-19 positive or experiencing its symptoms.  That person may remain in the waiting area during the procedure.  Masking is required regardless of vaccination status.

## 2020-05-17 ENCOUNTER — Other Ambulatory Visit: Payer: Medicare Other

## 2020-05-17 ENCOUNTER — Other Ambulatory Visit: Payer: Self-pay

## 2020-05-17 ENCOUNTER — Ambulatory Visit: Admission: RE | Admit: 2020-05-17 | Payer: Medicare Other | Source: Ambulatory Visit

## 2020-05-17 ENCOUNTER — Encounter
Admission: RE | Admit: 2020-05-17 | Discharge: 2020-05-17 | Disposition: A | Discharge status: Home / Self Care | Payer: Medicare Other | Source: Ambulatory Visit | Attending: Urology | Admitting: Urology

## 2020-05-17 DIAGNOSIS — Z01818 Encounter for other preprocedural examination: Secondary | ICD-10-CM | POA: Insufficient documentation

## 2020-05-17 DIAGNOSIS — Z20822 Contact with and (suspected) exposure to covid-19: Secondary | ICD-10-CM | POA: Diagnosis not present

## 2020-05-17 DIAGNOSIS — E119 Type 2 diabetes mellitus without complications: Secondary | ICD-10-CM | POA: Insufficient documentation

## 2020-05-17 DIAGNOSIS — I10 Essential (primary) hypertension: Secondary | ICD-10-CM | POA: Diagnosis not present

## 2020-05-17 LAB — SARS CORONAVIRUS 2 (TAT 6-24 HRS): SARS Coronavirus 2: NEGATIVE

## 2020-05-21 ENCOUNTER — Ambulatory Visit: Payer: Medicare Other | Admitting: Certified Registered Nurse Anesthetist

## 2020-05-21 ENCOUNTER — Encounter: Payer: Self-pay | Admitting: Urology

## 2020-05-21 ENCOUNTER — Encounter
Admission: RE | Disposition: A | Discharge status: Home / Self Care | Payer: Self-pay | Source: Home / Self Care | Attending: Urology

## 2020-05-21 ENCOUNTER — Ambulatory Visit: Payer: Medicare Other

## 2020-05-21 ENCOUNTER — Other Ambulatory Visit: Payer: Self-pay

## 2020-05-21 ENCOUNTER — Ambulatory Visit
Admission: RE | Admit: 2020-05-21 | Discharge: 2020-05-21 | Disposition: A | Discharge status: Home / Self Care | Payer: Medicare Other | Attending: Urology | Admitting: Urology

## 2020-05-21 DIAGNOSIS — Z96642 Presence of left artificial hip joint: Secondary | ICD-10-CM | POA: Insufficient documentation

## 2020-05-21 DIAGNOSIS — E7800 Pure hypercholesterolemia, unspecified: Secondary | ICD-10-CM | POA: Insufficient documentation

## 2020-05-21 DIAGNOSIS — N201 Calculus of ureter: Secondary | ICD-10-CM

## 2020-05-21 DIAGNOSIS — Z8673 Personal history of transient ischemic attack (TIA), and cerebral infarction without residual deficits: Secondary | ICD-10-CM | POA: Insufficient documentation

## 2020-05-21 DIAGNOSIS — K219 Gastro-esophageal reflux disease without esophagitis: Secondary | ICD-10-CM | POA: Insufficient documentation

## 2020-05-21 DIAGNOSIS — M1990 Unspecified osteoarthritis, unspecified site: Secondary | ICD-10-CM | POA: Diagnosis not present

## 2020-05-21 DIAGNOSIS — Z886 Allergy status to analgesic agent status: Secondary | ICD-10-CM | POA: Insufficient documentation

## 2020-05-21 DIAGNOSIS — N132 Hydronephrosis with renal and ureteral calculous obstruction: Secondary | ICD-10-CM | POA: Insufficient documentation

## 2020-05-21 DIAGNOSIS — I10 Essential (primary) hypertension: Secondary | ICD-10-CM | POA: Diagnosis not present

## 2020-05-21 DIAGNOSIS — N529 Male erectile dysfunction, unspecified: Secondary | ICD-10-CM | POA: Diagnosis not present

## 2020-05-21 DIAGNOSIS — E119 Type 2 diabetes mellitus without complications: Secondary | ICD-10-CM | POA: Insufficient documentation

## 2020-05-21 HISTORY — PX: CYSTOSCOPY/URETEROSCOPY/HOLMIUM LASER/STENT PLACEMENT: SHX6546

## 2020-05-21 HISTORY — PX: CYSTOSCOPY W/ RETROGRADES: SHX1426

## 2020-05-21 LAB — GLUCOSE, CAPILLARY
Glucose-Capillary: 106 mg/dL — ABNORMAL HIGH (ref 70–99)
Glucose-Capillary: 133 mg/dL — ABNORMAL HIGH (ref 70–99)

## 2020-05-21 SURGERY — CYSTOSCOPY/URETEROSCOPY/HOLMIUM LASER/STENT PLACEMENT
Anesthesia: General | Laterality: Left

## 2020-05-21 MED ORDER — OXYCODONE HCL 5 MG/5ML PO SOLN: 5.0000 mg | Freq: Once | ORAL | Status: DC | PRN

## 2020-05-21 MED ORDER — CEFAZOLIN SODIUM-DEXTROSE 2-4 GM/100ML-% IV SOLN
INTRAVENOUS | Status: AC
  Filled 2020-05-21: qty 100

## 2020-05-21 MED ORDER — ONDANSETRON HCL 4 MG/2ML IJ SOLN
INTRAMUSCULAR | Status: AC
  Filled 2020-05-21: qty 2

## 2020-05-21 MED ORDER — HYDROCODONE-ACETAMINOPHEN 5-325 MG PO TABS
1.0000 | ORAL_TABLET | Freq: Four times a day (QID) | ORAL | 0 refills | Status: DC | PRN
Start: 2020-05-21 — End: 2020-06-06

## 2020-05-21 MED ORDER — PROPOFOL 10 MG/ML IV BOLUS
INTRAVENOUS | Status: DC | PRN
  Administered 2020-05-21 (×2): 100 mg via INTRAVENOUS

## 2020-05-21 MED ORDER — SUGAMMADEX SODIUM 500 MG/5ML IV SOLN
INTRAVENOUS | Status: AC
  Filled 2020-05-21: qty 5

## 2020-05-21 MED ORDER — ACETAMINOPHEN 10 MG/ML IV SOLN
INTRAVENOUS | Status: DC | PRN
  Administered 2020-05-21: 1000 mg via INTRAVENOUS

## 2020-05-21 MED ORDER — ROCURONIUM BROMIDE 10 MG/ML (PF) SYRINGE
PREFILLED_SYRINGE | INTRAVENOUS | Status: AC
  Filled 2020-05-21: qty 10

## 2020-05-21 MED ORDER — ACETAMINOPHEN 10 MG/ML IV SOLN: 1000.0000 mg | Freq: Once | INTRAVENOUS | Status: DC | PRN

## 2020-05-21 MED ORDER — DEXAMETHASONE SODIUM PHOSPHATE 10 MG/ML IJ SOLN
INTRAMUSCULAR | Status: AC
  Filled 2020-05-21: qty 1

## 2020-05-21 MED ORDER — FENTANYL CITRATE (PF) 100 MCG/2ML IJ SOLN: 25.0000 ug | INTRAMUSCULAR | Status: DC | PRN

## 2020-05-21 MED ORDER — ORAL CARE MOUTH RINSE: 15.0000 mL | Freq: Once | OROMUCOSAL | Status: AC

## 2020-05-21 MED ORDER — IOHEXOL 180 MG/ML  SOLN
INTRAMUSCULAR | Status: DC | PRN
  Administered 2020-05-21: 20 mL

## 2020-05-21 MED ORDER — FENTANYL CITRATE (PF) 100 MCG/2ML IJ SOLN
INTRAMUSCULAR | Status: AC
  Filled 2020-05-21: qty 2

## 2020-05-21 MED ORDER — TAMSULOSIN HCL 0.4 MG PO CAPS: 0.4000 mg | ORAL_CAPSULE | Freq: Every day | ORAL | 0 refills | Status: DC

## 2020-05-21 MED ORDER — MIDAZOLAM HCL 2 MG/2ML IJ SOLN
INTRAMUSCULAR | Status: AC
  Filled 2020-05-21: qty 2

## 2020-05-21 MED ORDER — ONDANSETRON HCL 4 MG/2ML IJ SOLN: 4.0000 mg | Freq: Once | INTRAMUSCULAR | Status: DC | PRN

## 2020-05-21 MED ORDER — PROPOFOL 10 MG/ML IV BOLUS
INTRAVENOUS | Status: AC
  Filled 2020-05-21: qty 20

## 2020-05-21 MED ORDER — DEXAMETHASONE SODIUM PHOSPHATE 10 MG/ML IJ SOLN
INTRAMUSCULAR | Status: DC | PRN
  Administered 2020-05-21: 5 mg via INTRAVENOUS

## 2020-05-21 MED ORDER — PHENYLEPHRINE HCL (PRESSORS) 10 MG/ML IV SOLN
INTRAVENOUS | Status: DC | PRN
  Administered 2020-05-21 (×2): 100 ug via INTRAVENOUS

## 2020-05-21 MED ORDER — ACETAMINOPHEN 10 MG/ML IV SOLN
INTRAVENOUS | Status: AC
  Filled 2020-05-21: qty 100

## 2020-05-21 MED ORDER — OXYBUTYNIN CHLORIDE 5 MG PO TABS: 5.0000 mg | ORAL_TABLET | Freq: Three times a day (TID) | ORAL | 0 refills | Status: DC | PRN

## 2020-05-21 MED ORDER — ROCURONIUM BROMIDE 100 MG/10ML IV SOLN
INTRAVENOUS | Status: DC | PRN
  Administered 2020-05-21: 10 mg via INTRAVENOUS
  Administered 2020-05-21: 70 mg via INTRAVENOUS

## 2020-05-21 MED ORDER — CHLORHEXIDINE GLUCONATE 0.12 % MT SOLN: 15.0000 mL | Freq: Once | OROMUCOSAL | Status: AC

## 2020-05-21 MED ORDER — SUGAMMADEX SODIUM 200 MG/2ML IV SOLN
INTRAVENOUS | Status: DC | PRN
  Administered 2020-05-21: 300 mg via INTRAVENOUS

## 2020-05-21 MED ORDER — ONDANSETRON HCL 4 MG/2ML IJ SOLN
INTRAMUSCULAR | Status: DC | PRN
  Administered 2020-05-21: 4 mg via INTRAVENOUS

## 2020-05-21 MED ORDER — CEFAZOLIN SODIUM-DEXTROSE 2-4 GM/100ML-% IV SOLN
2.0000 g | INTRAVENOUS | Status: AC
  Administered 2020-05-21: 2 g via INTRAVENOUS

## 2020-05-21 MED ORDER — LACTATED RINGERS IV SOLN: INTRAVENOUS | Status: DC | PRN

## 2020-05-21 MED ORDER — SODIUM CHLORIDE 0.9 % IV SOLN: INTRAVENOUS | Status: DC

## 2020-05-21 MED ORDER — OXYCODONE HCL 5 MG PO TABS: 5.0000 mg | ORAL_TABLET | Freq: Once | ORAL | Status: DC | PRN

## 2020-05-21 MED ORDER — DEXMEDETOMIDINE HCL IN NACL 80 MCG/20ML IV SOLN
INTRAVENOUS | Status: AC
  Filled 2020-05-21: qty 20

## 2020-05-21 MED ORDER — MIDAZOLAM HCL 2 MG/2ML IJ SOLN
INTRAMUSCULAR | Status: DC | PRN
  Administered 2020-05-21: 2 mg via INTRAVENOUS

## 2020-05-21 MED ORDER — FENTANYL CITRATE (PF) 100 MCG/2ML IJ SOLN
INTRAMUSCULAR | Status: DC | PRN
Start: 2020-05-21 — End: 2020-05-21
  Administered 2020-05-21: 100 ug via INTRAVENOUS

## 2020-05-21 MED ORDER — LIDOCAINE HCL (CARDIAC) PF 100 MG/5ML IV SOSY
PREFILLED_SYRINGE | INTRAVENOUS | Status: DC | PRN
  Administered 2020-05-21: 100 mg via INTRAVENOUS

## 2020-05-21 MED ORDER — CHLORHEXIDINE GLUCONATE 0.12 % MT SOLN
OROMUCOSAL | Status: AC
  Administered 2020-05-21: 15 mL via OROMUCOSAL
  Filled 2020-05-21: qty 15

## 2020-05-21 SURGICAL SUPPLY — 32 items
BAG DRAIN CYSTO-URO LG1000N (MISCELLANEOUS) ×4 IMPLANT
BASKET ZERO TIP 1.9FR (BASKET) IMPLANT
BRUSH SCRUB EZ 1% IODOPHOR (MISCELLANEOUS) ×4 IMPLANT
BSKT STON RTRVL ZERO TP 1.9FR (BASKET)
CATH URETL 5X70 OPEN END (CATHETERS) ×4 IMPLANT
CNTNR SPEC 2.5X3XGRAD LEK (MISCELLANEOUS)
CONT SPEC 4OZ STER OR WHT (MISCELLANEOUS)
CONT SPEC 4OZ STRL OR WHT (MISCELLANEOUS)
CONTAINER SPEC 2.5X3XGRAD LEK (MISCELLANEOUS) IMPLANT
DRAPE UTILITY 15X26 TOWEL STRL (DRAPES) ×4 IMPLANT
FIBER LASER TRACTIP 200 (UROLOGICAL SUPPLIES) ×4 IMPLANT
GLIDEWIRE STIFF .35X180X3 HYDR (WIRE) ×2 IMPLANT
GLOVE BIO SURGEON STRL SZ 6.5 (GLOVE) ×3 IMPLANT
GLOVE BIO SURGEONS STRL SZ 6.5 (GLOVE) ×1
GOWN STRL REUS W/ TWL LRG LVL3 (GOWN DISPOSABLE) ×4 IMPLANT
GOWN STRL REUS W/TWL LRG LVL3 (GOWN DISPOSABLE) ×8
GUIDEWIRE GREEN .038 145CM (MISCELLANEOUS) ×2 IMPLANT
GUIDEWIRE STR DUAL SENSOR (WIRE) ×4 IMPLANT
INFUSOR MANOMETER BAG 3000ML (MISCELLANEOUS) ×4 IMPLANT
INTRODUCER DILATOR DOUBLE (INTRODUCER) IMPLANT
KIT TURNOVER CYSTO (KITS) ×4 IMPLANT
PACK CYSTO AR (MISCELLANEOUS) ×4 IMPLANT
SET CYSTO W/LG BORE CLAMP LF (SET/KITS/TRAYS/PACK) ×4 IMPLANT
SHEATH URETERAL 12FRX35CM (MISCELLANEOUS) IMPLANT
SOL .9 NS 3000ML IRR  AL (IV SOLUTION) ×4
SOL .9 NS 3000ML IRR AL (IV SOLUTION) ×2
SOL .9 NS 3000ML IRR UROMATIC (IV SOLUTION) ×2 IMPLANT
STENT PERCUFLEX 4.8FRX26 (STENTS) ×2 IMPLANT
STENT URET 6FRX24 CONTOUR (STENTS) IMPLANT
STENT URET 6FRX26 CONTOUR (STENTS) IMPLANT
SURGILUBE 2OZ TUBE FLIPTOP (MISCELLANEOUS) ×4 IMPLANT
WATER STERILE IRR 1000ML POUR (IV SOLUTION) ×4 IMPLANT

## 2020-05-21 NOTE — Discharge Instructions (Addendum)
You have a ureteral stent in place.  This is a tube that extends from your kidney to your bladder.  This may cause urinary bleeding, burning with urination, and urinary frequency.  Please call our office or present to the ED if you develop fevers >101 or pain which is not able to be controlled with oral pain medications.  You may be given either Flomax and/ or ditropan to help with bladder spasms and stent pain in addition to pain medications.   ° °[facility] °[address], [address] °[place], [place] [address] °[phone] ° ° ° °AMBULATORY SURGERY  °DISCHARGE INSTRUCTIONS ° ° °1) The drugs that you were given will stay in your system until tomorrow so for the next 24 hours you should not: ° °A) Drive an automobile °B) Make any legal decisions °C) Drink any alcoholic beverage ° ° °2) You may resume regular meals tomorrow.  Today it is better to start with liquids and gradually work up to solid foods. ° °You may eat anything you prefer, but it is better to start with liquids, then soup and crackers, and gradually work up to solid foods. ° ° °3) Please notify your doctor immediately if you have any unusual bleeding, trouble breathing, redness and pain at the surgery site, drainage, fever, or pain not relieved by medication. ° ° ° °4) Additional Instructions: ° ° ° ° ° ° ° °Please contact your physician with any problems or Same Day Surgery at [phone], Monday through Friday 6 am to 4 pm, or [place] at [place] Main number at [phone]. °

## 2020-05-21 NOTE — Anesthesia Postprocedure Evaluation (Signed)
Anesthesia Post Note  Patient: Jesus Bell  Procedure(s) Performed: CYSTOSCOPY/URETEROSCOPY/STENT PLACEMENT (Left ) CYSTOSCOPY WITH RETROGRADE PYELOGRAM  Patient location during evaluation: PACU Anesthesia Type: General Level of consciousness: awake and alert Pain management: pain level controlled Vital Signs Assessment: post-procedure vital signs reviewed and stable Respiratory status: spontaneous breathing, nonlabored ventilation, respiratory function stable and patient connected to nasal cannula oxygen Cardiovascular status: blood pressure returned to baseline and stable Postop Assessment: no apparent nausea or vomiting Anesthetic complications: no   No complications documented.   Last Vitals:  Vitals:   05/21/20 1610 05/21/20 1621  BP: 131/83 139/75  Pulse: 73 70  Resp: (!) 22 18  Temp: (!) 36.2 C (!) 36.3 C  SpO2: 100% 95%    Last Pain:  Vitals:   05/21/20 1621  TempSrc: Temporal  PainSc: 0-No pain                 Arita Miss

## 2020-05-21 NOTE — Op Note (Signed)
Date of procedure: 05/21/20  Preoperative diagnosis:  1. Left ureteral calculus 2. Left hydroureteronephrosis  Postoperative diagnosis:  1. Same as above  Procedure: 1. Left ureteroscopy 2. Left retrograde pyelogram 3. Left ureteral stent placement  Surgeon: Hollice Espy, MD  Anesthesia: General  Complications: None  Intraoperative findings: High-grade mid ureteral obstruction.  Initial retrograde without any contrast above the level of the stone.  Multiple techniques used in order to pass a wire beyond the stone which was extremely difficult.  Ultimately, only able to place a 4.8 x 26 French double-J ureteral stent on left.  Small lesions on bladder neck.  EBL: Minimal  Specimens: None  Drains: 4.8 [place] by 26 ureteral stent on left  Indication: Jesus Bell is a 64 y.o. patient with incidental hydroureteronephrosis and microscopic hematuria, underwent CT urogram demonstrating a 6 mm left proximal/mid ureteral calculus.  After reviewing the management options for treatment, he elected to proceed with the above surgical procedure(s). We have discussed the potential benefits and risks of the procedure, side effects of the proposed treatment, the likelihood of the patient achieving the goals of the procedure, and any potential problems that might occur during the procedure or recuperation. Informed consent has been obtained.  Description of procedure:  The patient was taken to the operating room and general anesthesia was induced.  The patient was placed in the dorsal lithotomy position, prepped and draped in the usual sterile fashion, and preoperative antibiotics were administered. A preoperative time-out was performed.   A 21 French scope was advanced per urethra into the bladder.  The bladder was carefully inspected.  There are no bladder lesions appreciated although there was 2 small areas adjacent to the bladder neck which had a somewhat raised appearance.  They appeared  benign in nature.  Attention was turned to the left ureteral orifice.  Initially had some difficulty advancing a 5 [place] open-ended and required to the distal ureter due to J hooking of the distal ureter.  Ultimately, it was successful using an angled Glidewire.  I was able to advance this wire up to the level of the mid ureter but not beyond this level.  Advanced a 5 [place] open-ended over this wire into the mid distal ureter distal to the stone.  I injected contrast which revealed a extremely high-grade obstruction without any contrast above the level of the stone whatsoever.  The stone could be seen on scout imaging.  I then exchanged the wire for sensor wire I was unable to get this around the stone either.  I coiled the sensor wire just distal to the location of the stone.  I attempted to use an angled Glidewire after injecting a slurry of K-Y jelly and Conray around the stone but was unsuccessful.  Ultimately, I elected to blindly ureteroscope as I had no other options other than placing a percutaneous nephrostomy tube.  Advance the semirigid ureteroscope just proximal to the level of the stone but I was never actually been able to directly visualize the stone as the scope would not go any further.  I tried multiple attempts at various different wires through the scope itself around the wire unsuccessfully.  I elected to coil 2 wires just distal to the stone and attempt at placing the flexible scope hopefully in order to directly visualize the stone.  This was also unsuccessful and I was unable to advance a flexible digital ureteroscope to the mid ureter and not to the level of the stone.  Ultimately, I  injected more [place] slurry with Conray but with higher pressure.  No extravasation was appreciated and I did see a small wisp of contrast above the level of the stone on this occasion.  After some maneuvering, ultimately was able to get a sensor wire around the stone.  There are some tortuosity in the most  proximal ureter and I had some difficulty here.  At some difficulty advancing a 5 [place] around the stone but ultimately was able to do that and advanced the open-ended into the proximal ureter.  The string the ureter out somewhat and allow the sensor wire to advance all the way to the level of the kidney.  I was able to inject some Conray solution here one last time to confirm position within the renal pelvis.  Finally, the sensor wire was backloaded over rigid cystoscope.  I first attempted to advance a 6 x 26 French double-J ureteral stent beyond the level of the stone but met resistance at the stone itself.  This clearly would not go.  Given had difficulty even with a 5 [place] open-ended ureteral catheter, elected to place a 4.8 [place] stent instead.  This is also somewhat difficult but ultimately successful as confirmed fluoroscopically.  There is a full coil noted within the renal pelvis as well as within the bladder.  The bladder was then drained.  Plan: I will have the patient return in 2 weeks for second attempt at ureteroscopy after passive dilation with a small stent.  I discussed the findings with his wife.  As the suspect the stone has been there for quite some time he is at higher risk for stricture formation.  There is also risk there may not be able to get to the stone on the second attempt and may need to upsize the stent on that occasion.  We will also plan to biopsy his bladder neck at that date.  All questions answered.  Hollice Espy, M.D.

## 2020-05-21 NOTE — Interval H&P Note (Signed)
History and Physical Interval Note:  05/21/2020 1:11 PM  Jesus Bell  has presented today for surgery, with the diagnosis of Left ureteral stone.  The various methods of treatment have been discussed with the patient and family. After consideration of risks, benefits and other options for treatment, the patient has consented to  Procedure(s): CYSTOSCOPY/URETEROSCOPY/HOLMIUM LASER/STENT PLACEMENT (Left) as a surgical intervention.  The patient's history has been reviewed, patient examined, no change in status, stable for surgery.  I have reviewed the patient's chart and labs.  Questions were answered to the patient's satisfaction.    RRR CTAB   Hollice Espy

## 2020-05-21 NOTE — Anesthesia Procedure Notes (Addendum)
Procedure Name: Intubation Date/Time: 05/21/2020 1:51 PM Performed by: Louann Sjogren, CRNA Pre-anesthesia Checklist: Patient identified, Patient being monitored, Timeout performed, Emergency Drugs available and Suction available Patient Re-evaluated:Patient Re-evaluated prior to induction Oxygen Delivery Method: Circle system utilized Preoxygenation: Pre-oxygenation with 100% oxygen Induction Type: IV induction Ventilation: Mask ventilation without difficulty Laryngoscope Size: Mac and 4 Grade View: Grade I Tube type: Oral Tube size: 7.0 mm Number of attempts: 1 Airway Equipment and Method: Stylet Placement Confirmation: ETT inserted through vocal cords under direct vision,  positive ETCO2 and breath sounds checked- equal and bilateral Secured at: 21 cm Tube secured with: Tape Dental Injury: Teeth and Oropharynx as per pre-operative assessment

## 2020-05-21 NOTE — Transfer of Care (Signed)
Immediate Anesthesia Transfer of Care Note  Patient: Jesus Bell  Procedure(s) Performed: CYSTOSCOPY/URETEROSCOPY/STENT PLACEMENT (Left ) CYSTOSCOPY WITH RETROGRADE PYELOGRAM  Patient Location: PACU  Anesthesia Type:General  Level of Consciousness: drowsy  Airway & Oxygen Therapy: Patient Spontanous Breathing and Patient connected to face mask oxygen  Post-op Assessment: Report given to RN and Post -op Vital signs reviewed and stable  Post vital signs: Reviewed and stable  Last Vitals:  Vitals Value Taken Time  BP 146/91 05/21/20 1538  Temp    Pulse 82 05/21/20 1541  Resp 17 05/21/20 1541  SpO2 100 % 05/21/20 1541  Vitals shown include unvalidated device data.  Last Pain:  Vitals:   05/21/20 1225  TempSrc: Temporal  PainSc: 0-No pain         Complications: No complications documented.

## 2020-05-21 NOTE — Anesthesia Preprocedure Evaluation (Signed)
Anesthesia Evaluation  Patient identified by MRN, date of birth, ID band Patient awake    Reviewed: Allergy & Precautions, NPO status , Patient's Chart, lab work & pertinent test results  History of Anesthesia Complications Negative for: history of anesthetic complications  Airway Mallampati: II  TM Distance: >3 FB Neck ROM: Full    Dental  (+) Caps, Dental Advisory Given, Teeth Intact   Pulmonary neg pulmonary ROS, neg sleep apnea, neg COPD, Patient abstained from smoking.Not current smoker,    Pulmonary exam normal breath sounds clear to auscultation       Cardiovascular Exercise Tolerance: Good METShypertension, Pt. on medications (-) CAD and (-) Past MI Normal cardiovascular exam(-) dysrhythmias  Rhythm:Regular Rate:Normal     Neuro/Psych CVA, No Residual Symptoms negative psych ROS   GI/Hepatic Neg liver ROS, GERD  Medicated,  Endo/Other  diabetes, Well Controlled, Type 2, Oral Hypoglycemic Agents  Renal/GU negative Renal ROS  negative genitourinary   Musculoskeletal  (+) Arthritis , Osteoarthritis,    Abdominal (+) + obese,   Peds negative pediatric ROS (+)  Hematology negative hematology ROS (+)   Anesthesia Other Findings Past Medical History: No date: Chicken pox No date: Diabetes mellitus type 2, uncomplicated (HCC) No date: Diabetes mellitus without complication (HCC)     Comment:  boarderline No date: Erectile dysfunction No date: GERD (gastroesophageal reflux disease) No date: History of kidney stones No date: Hypercholesterolemia No date: Hypertension 2012: Stroke [facility])     Comment:  hemorrhagic vessel  Reproductive/Obstetrics                             Anesthesia Physical  Anesthesia Plan  ASA: II  Anesthesia Plan: General   Post-op Pain Management:    Induction: Intravenous  PONV Risk Score and Plan: 3 and Ondansetron, Dexamethasone and Midazolam  Airway  Management Planned: Oral ETT  Additional Equipment: None  Intra-op Plan:   Post-operative Plan: Extubation in OR  Informed Consent: I have reviewed the patients History and Physical, chart, labs and discussed the procedure including the risks, benefits and alternatives for the proposed anesthesia with the patient or authorized representative who has indicated his/her understanding and acceptance.     Dental advisory given  Plan Discussed with: CRNA and Surgeon  Anesthesia Plan Comments: (Discussed risks of anesthesia with patient, including PONV, sore throat, lip/dental damage. Rare risks discussed as well, such as cardiorespiratory and neurological sequelae. Patient understands.)        Anesthesia Quick Evaluation

## 2020-05-22 ENCOUNTER — Other Ambulatory Visit: Payer: Self-pay | Admitting: Radiology

## 2020-05-22 DIAGNOSIS — N201 Calculus of ureter: Secondary | ICD-10-CM

## 2020-05-23 ENCOUNTER — Other Ambulatory Visit: Payer: Self-pay

## 2020-05-23 ENCOUNTER — Ambulatory Visit
Admission: RE | Admit: 2020-05-23 | Discharge: 2020-05-23 | Disposition: A | Discharge status: Home / Self Care | Payer: Medicare Other | Source: Ambulatory Visit | Attending: Oncology | Admitting: Oncology

## 2020-05-23 ENCOUNTER — Encounter: Payer: Self-pay | Admitting: *Deleted

## 2020-05-23 ENCOUNTER — Other Ambulatory Visit: Payer: Self-pay | Admitting: Oncology

## 2020-05-23 ENCOUNTER — Encounter: Payer: Self-pay | Admitting: Oncology

## 2020-05-23 DIAGNOSIS — R911 Solitary pulmonary nodule: Secondary | ICD-10-CM

## 2020-05-23 MED ORDER — IOHEXOL 300 MG/ML  SOLN
75.0000 mL | Freq: Once | INTRAMUSCULAR | Status: AC | PRN
  Administered 2020-05-23: 75 mL via INTRAVENOUS

## 2020-05-23 NOTE — Progress Notes (Unsigned)
Re: Lung Nodule   Mr. Jesus Bell is a 64 year old male with past medical history significant for hypertension and diabetes who was recently seen by Dr. Erlene Quan for hematuria.  He was found to have a left kidney stone and had a cystoscopy.  CT hematuria revealed a groundglass nodular opacity in the right lower lobe measuring 2 cm suspicious for bronchogenic neoplasm.  Dedicated CT chest was recommended.  Patient was referred to the pulmonary nodule clinic.  Orders placed for CT chest with contrast.  CT chest with contrast completed on 05/23/2020 showed a 2 cm predominantly solid nodule in the posterior right lower lobe highly suspicious for primary bronchogenic neoplasm specifically invasive adenocarcinoma.  Recommendation for multidisciplinary tumor board and PET scan.  Orders placed for PET scan.  [place], nurse navigator will discuss patient at tumor board.  Patient will likely see medical oncology in the near future.  Faythe Casa, NP 05/23/2020 1:15 PM

## 2020-05-23 NOTE — Progress Notes (Signed)
  Oncology Nurse Navigator Documentation  Navigator Location: [facility] (05/23/20 1500) Referral Date to RadOnc/MedOnc: 05/08/20 (05/23/20 1500) )Navigator Encounter Type: Diagnostic Results;Telephone (05/23/20 1500) Telephone: Appt Confirmation/Clarification;Outgoing Call;Diagnostic Results (05/23/20 1500) Abnormal Finding Date: 05/11/20 (05/23/20 1500)                   Treatment Phase: Abnormal Scans (05/23/20 1500) Barriers/Navigation Needs: Coordination of Care (05/23/20 1500)   Interventions: Coordination of Care (05/23/20 1500)   Coordination of Care: Appts;Radiology (05/23/20 1500)       Spoke with patient to review CT scan after discussing with Faythe Casa, NP. CT results and provider recommendations reviewed with patient. All questions answered. Pt given initial appts for PET scan on 9/8 and new patient visit with Dr. Jacinto Reap on 9/10 but pt declined and wanted to move to after his procedure with Dr. Erlene Quan which is scheduled for 9/13. Per pt request, PET scan moved to 9/21 and new pt visit on 9/22 with Dr. Rogue Bussing. Dr. B made aware. New appts reviewed with patient. Contact info given and instructed to call with any further questions or needs.           Time Spent with Patient: 30 (05/23/20 1500)

## 2020-05-24 ENCOUNTER — Other Ambulatory Visit: Payer: Self-pay

## 2020-05-24 DIAGNOSIS — N201 Calculus of ureter: Secondary | ICD-10-CM

## 2020-05-25 ENCOUNTER — Other Ambulatory Visit: Payer: Medicare Other

## 2020-05-25 ENCOUNTER — Other Ambulatory Visit: Payer: Self-pay

## 2020-05-25 DIAGNOSIS — N201 Calculus of ureter: Secondary | ICD-10-CM

## 2020-05-25 LAB — URINALYSIS, COMPLETE
Bilirubin, UA: NEGATIVE
Ketones, UA: NEGATIVE
Nitrite, UA: NEGATIVE
Specific Gravity, UA: <1.005 — ABNORMAL LOW (ref 1.005–1.030)
Urobilinogen, Ur: 0.2 mg/dL (ref 0.2–1.0)
pH, UA: 6 (ref 5.0–7.5)

## 2020-05-25 LAB — MICROSCOPIC EXAMINATION: RBC, Urine: >30 /hpf — AB (ref 0–2)

## 2020-05-28 LAB — CULTURE, URINE COMPREHENSIVE

## 2020-05-30 ENCOUNTER — Other Ambulatory Visit: Payer: Medicare Other

## 2020-05-31 ENCOUNTER — Other Ambulatory Visit: Payer: Self-pay

## 2020-05-31 ENCOUNTER — Other Ambulatory Visit
Admission: RE | Admit: 2020-05-31 | Discharge: 2020-05-31 | Disposition: A | Discharge status: Home / Self Care | Payer: Medicare Other | Source: Ambulatory Visit | Attending: Urology | Admitting: Urology

## 2020-05-31 DIAGNOSIS — Z20822 Contact with and (suspected) exposure to covid-19: Secondary | ICD-10-CM | POA: Diagnosis not present

## 2020-05-31 DIAGNOSIS — Z01812 Encounter for preprocedural laboratory examination: Secondary | ICD-10-CM | POA: Diagnosis present

## 2020-06-01 ENCOUNTER — Ambulatory Visit: Payer: Medicare Other | Admitting: Internal Medicine

## 2020-06-01 LAB — SARS CORONAVIRUS 2 (TAT 6-24 HRS): SARS Coronavirus 2: NEGATIVE

## 2020-06-04 ENCOUNTER — Ambulatory Visit: Payer: Medicare Other | Admitting: Certified Registered"

## 2020-06-04 ENCOUNTER — Other Ambulatory Visit: Payer: Self-pay

## 2020-06-04 ENCOUNTER — Ambulatory Visit
Admission: RE | Admit: 2020-06-04 | Discharge: 2020-06-04 | Disposition: A | Discharge status: Home / Self Care | Payer: Medicare Other | Attending: Urology | Admitting: Urology

## 2020-06-04 ENCOUNTER — Ambulatory Visit: Payer: Medicare Other

## 2020-06-04 ENCOUNTER — Encounter
Admission: RE | Disposition: A | Discharge status: Home / Self Care | Payer: Self-pay | Source: Home / Self Care | Attending: Urology

## 2020-06-04 ENCOUNTER — Encounter: Payer: Self-pay | Admitting: Urology

## 2020-06-04 DIAGNOSIS — N3080 Other cystitis without hematuria: Secondary | ICD-10-CM | POA: Insufficient documentation

## 2020-06-04 DIAGNOSIS — N529 Male erectile dysfunction, unspecified: Secondary | ICD-10-CM | POA: Insufficient documentation

## 2020-06-04 DIAGNOSIS — Z79899 Other long term (current) drug therapy: Secondary | ICD-10-CM | POA: Diagnosis not present

## 2020-06-04 DIAGNOSIS — N201 Calculus of ureter: Secondary | ICD-10-CM

## 2020-06-04 DIAGNOSIS — D494 Neoplasm of unspecified behavior of bladder: Secondary | ICD-10-CM | POA: Diagnosis not present

## 2020-06-04 DIAGNOSIS — I10 Essential (primary) hypertension: Secondary | ICD-10-CM | POA: Insufficient documentation

## 2020-06-04 DIAGNOSIS — Z96642 Presence of left artificial hip joint: Secondary | ICD-10-CM | POA: Insufficient documentation

## 2020-06-04 DIAGNOSIS — K219 Gastro-esophageal reflux disease without esophagitis: Secondary | ICD-10-CM | POA: Diagnosis not present

## 2020-06-04 DIAGNOSIS — Z7984 Long term (current) use of oral hypoglycemic drugs: Secondary | ICD-10-CM | POA: Diagnosis not present

## 2020-06-04 DIAGNOSIS — Z8673 Personal history of transient ischemic attack (TIA), and cerebral infarction without residual deficits: Secondary | ICD-10-CM | POA: Insufficient documentation

## 2020-06-04 DIAGNOSIS — N329 Bladder disorder, unspecified: Secondary | ICD-10-CM | POA: Diagnosis not present

## 2020-06-04 DIAGNOSIS — E119 Type 2 diabetes mellitus without complications: Secondary | ICD-10-CM | POA: Insufficient documentation

## 2020-06-04 HISTORY — PX: CYSTOSCOPY WITH BIOPSY: SHX5122

## 2020-06-04 HISTORY — PX: CYSTOSCOPY/URETEROSCOPY/HOLMIUM LASER/STENT PLACEMENT: SHX6546

## 2020-06-04 LAB — GLUCOSE, CAPILLARY
Glucose-Capillary: 120 mg/dL — ABNORMAL HIGH (ref 70–99)
Glucose-Capillary: 138 mg/dL — ABNORMAL HIGH (ref 70–99)

## 2020-06-04 SURGERY — CYSTOSCOPY/URETEROSCOPY/HOLMIUM LASER/STENT PLACEMENT
Anesthesia: General

## 2020-06-04 MED ORDER — FENTANYL CITRATE (PF) 100 MCG/2ML IJ SOLN
INTRAMUSCULAR | Status: DC | PRN
Start: 2020-06-04 — End: 2020-06-04
  Administered 2020-06-04 (×2): 50 ug via INTRAVENOUS

## 2020-06-04 MED ORDER — PROPOFOL 10 MG/ML IV BOLUS: INTRAVENOUS | Status: DC | PRN

## 2020-06-04 MED ORDER — FAMOTIDINE 20 MG PO TABS
ORAL_TABLET | ORAL | Status: AC
  Administered 2020-06-04: 20 mg via ORAL
  Filled 2020-06-04: qty 1

## 2020-06-04 MED ORDER — FENTANYL CITRATE (PF) 100 MCG/2ML IJ SOLN: 25.0000 ug | INTRAMUSCULAR | Status: DC | PRN

## 2020-06-04 MED ORDER — CHLORHEXIDINE GLUCONATE 0.12 % MT SOLN: 15.0000 mL | Freq: Once | OROMUCOSAL | Status: AC

## 2020-06-04 MED ORDER — MIDAZOLAM HCL 2 MG/2ML IJ SOLN
INTRAMUSCULAR | Status: AC
  Filled 2020-06-04: qty 2

## 2020-06-04 MED ORDER — LIDOCAINE HCL (CARDIAC) PF 100 MG/5ML IV SOSY
PREFILLED_SYRINGE | INTRAVENOUS | Status: DC | PRN
  Administered 2020-06-04: 100 mg via INTRAVENOUS

## 2020-06-04 MED ORDER — CEFAZOLIN SODIUM-DEXTROSE 2-4 GM/100ML-% IV SOLN
2.0000 g | INTRAVENOUS | Status: AC
  Administered 2020-06-04: 2 g via INTRAVENOUS

## 2020-06-04 MED ORDER — GLYCOPYRROLATE 0.2 MG/ML IJ SOLN
INTRAMUSCULAR | Status: DC | PRN
  Administered 2020-06-04: .2 mg via INTRAVENOUS

## 2020-06-04 MED ORDER — DEXAMETHASONE SODIUM PHOSPHATE 10 MG/ML IJ SOLN
INTRAMUSCULAR | Status: DC | PRN
  Administered 2020-06-04: 10 mg via INTRAVENOUS

## 2020-06-04 MED ORDER — SUGAMMADEX SODIUM 500 MG/5ML IV SOLN
INTRAVENOUS | Status: DC | PRN
  Administered 2020-06-04: 550 mg via INTRAVENOUS

## 2020-06-04 MED ORDER — FAMOTIDINE 20 MG PO TABS: 20.0000 mg | ORAL_TABLET | Freq: Once | ORAL | Status: AC

## 2020-06-04 MED ORDER — ORAL CARE MOUTH RINSE: 15.0000 mL | Freq: Once | OROMUCOSAL | Status: AC

## 2020-06-04 MED ORDER — ROCURONIUM BROMIDE 100 MG/10ML IV SOLN
INTRAVENOUS | Status: DC | PRN
  Administered 2020-06-04: 50 mg via INTRAVENOUS

## 2020-06-04 MED ORDER — ONDANSETRON HCL 4 MG/2ML IJ SOLN: 4.0000 mg | Freq: Once | INTRAMUSCULAR | Status: DC | PRN

## 2020-06-04 MED ORDER — ACETAMINOPHEN 10 MG/ML IV SOLN
INTRAVENOUS | Status: AC
  Filled 2020-06-04: qty 100

## 2020-06-04 MED ORDER — CHLORHEXIDINE GLUCONATE 0.12 % MT SOLN
OROMUCOSAL | Status: AC
  Administered 2020-06-04: 15 mL via OROMUCOSAL
  Filled 2020-06-04: qty 15

## 2020-06-04 MED ORDER — FENTANYL CITRATE (PF) 100 MCG/2ML IJ SOLN
INTRAMUSCULAR | Status: AC
  Filled 2020-06-04: qty 2

## 2020-06-04 MED ORDER — PROPOFOL 10 MG/ML IV BOLUS
INTRAVENOUS | Status: DC | PRN
  Administered 2020-06-04: 150 mg via INTRAVENOUS

## 2020-06-04 MED ORDER — OXYBUTYNIN CHLORIDE 5 MG PO TABS: 5.0000 mg | ORAL_TABLET | Freq: Three times a day (TID) | ORAL | 0 refills | Status: DC | PRN

## 2020-06-04 MED ORDER — MIDAZOLAM HCL 2 MG/2ML IJ SOLN
INTRAMUSCULAR | Status: DC | PRN
  Administered 2020-06-04 (×2): 1 mg via INTRAVENOUS

## 2020-06-04 MED ORDER — SODIUM CHLORIDE 0.9 % IV SOLN: INTRAVENOUS | Status: DC

## 2020-06-04 MED ORDER — IOHEXOL 180 MG/ML  SOLN
INTRAMUSCULAR | Status: DC | PRN
  Administered 2020-06-04: 20 mL

## 2020-06-04 MED ORDER — CEFAZOLIN SODIUM-DEXTROSE 2-4 GM/100ML-% IV SOLN
INTRAVENOUS | Status: AC
  Filled 2020-06-04: qty 100

## 2020-06-04 MED ORDER — ONDANSETRON HCL 4 MG/2ML IJ SOLN
INTRAMUSCULAR | Status: DC | PRN
  Administered 2020-06-04: 4 mg via INTRAVENOUS

## 2020-06-04 MED ORDER — ACETAMINOPHEN 10 MG/ML IV SOLN
INTRAVENOUS | Status: DC | PRN
  Administered 2020-06-04: 1000 mg via INTRAVENOUS

## 2020-06-04 SURGICAL SUPPLY — 41 items
BAG DRAIN CYSTO-URO LG1000N (MISCELLANEOUS) ×4 IMPLANT
BASKET ZERO TIP 1.9FR (BASKET) ×2 IMPLANT
BRUSH SCRUB EZ  4% CHG (MISCELLANEOUS) ×4
BRUSH SCRUB EZ 1% IODOPHOR (MISCELLANEOUS) ×4 IMPLANT
BRUSH SCRUB EZ 4% CHG (MISCELLANEOUS) ×2 IMPLANT
BSKT STON RTRVL ZERO TP 1.9FR (BASKET) ×2
CATH URET FLEX-TIP 2 LUMEN 10F (CATHETERS) ×2 IMPLANT
CATH URETL 5X70 OPEN END (CATHETERS) ×4 IMPLANT
CNTNR SPEC 2.5X3XGRAD LEK (MISCELLANEOUS)
CONT SPEC 4OZ STER OR WHT (MISCELLANEOUS)
CONT SPEC 4OZ STRL OR WHT (MISCELLANEOUS)
CONTAINER SPEC 2.5X3XGRAD LEK (MISCELLANEOUS) IMPLANT
DRAPE UTILITY 15X26 TOWEL STRL (DRAPES) ×4 IMPLANT
DRSG TELFA 4X3 1S NADH ST (GAUZE/BANDAGES/DRESSINGS) ×6 IMPLANT
ELECT REM PT RETURN 9FT ADLT (ELECTROSURGICAL) ×4
ELECTRODE REM PT RTRN 9FT ADLT (ELECTROSURGICAL) ×2 IMPLANT
FIBER LASER TRACTIP 200 (UROLOGICAL SUPPLIES) ×6 IMPLANT
GLOVE BIO SURGEON STRL SZ 6.5 (GLOVE) ×3 IMPLANT
GLOVE BIO SURGEONS STRL SZ 6.5 (GLOVE) ×1
GOWN STRL REUS W/ TWL LRG LVL3 (GOWN DISPOSABLE) ×4 IMPLANT
GOWN STRL REUS W/TWL LRG LVL3 (GOWN DISPOSABLE) ×8
GUIDEWIRE GREEN .038 145CM (MISCELLANEOUS) ×2 IMPLANT
GUIDEWIRE STR DUAL SENSOR (WIRE) ×6 IMPLANT
INFUSOR MANOMETER BAG 3000ML (MISCELLANEOUS) ×4 IMPLANT
INTRODUCER DILATOR DOUBLE (INTRODUCER) IMPLANT
KIT TURNOVER CYSTO (KITS) ×4 IMPLANT
NDL SAFETY ECLIPSE 18X1.5 (NEEDLE) ×2 IMPLANT
NEEDLE HYPO 18GX1.5 SHARP (NEEDLE) ×4
PACK CYSTO AR (MISCELLANEOUS) ×4 IMPLANT
SET CYSTO W/LG BORE CLAMP LF (SET/KITS/TRAYS/PACK) ×4 IMPLANT
SHEATH URETERAL 12FR 45CM (SHEATH) ×2 IMPLANT
SHEATH URETERAL 12FRX35CM (MISCELLANEOUS) IMPLANT
SOL .9 NS 3000ML IRR  AL (IV SOLUTION) ×4
SOL .9 NS 3000ML IRR AL (IV SOLUTION) ×2
SOL .9 NS 3000ML IRR UROMATIC (IV SOLUTION) ×2 IMPLANT
STENT URET 6FRX24 CONTOUR (STENTS) IMPLANT
STENT URET 6FRX26 CONTOUR (STENTS) ×2 IMPLANT
SURGILUBE 2OZ TUBE FLIPTOP (MISCELLANEOUS) ×4 IMPLANT
Sensor straight tip ×2 IMPLANT
WATER STERILE IRR 1000ML POUR (IV SOLUTION) ×4 IMPLANT
WATER STERILE IRR 3000ML UROMA (IV SOLUTION) ×4 IMPLANT

## 2020-06-04 NOTE — Anesthesia Procedure Notes (Signed)
Procedure Name: Intubation Performed by: Fletcher-Harrison, Shaya Altamura, CRNA Pre-anesthesia Checklist: Patient identified, Emergency Drugs available, Suction available and Patient being monitored Patient Re-evaluated:Patient Re-evaluated prior to induction Oxygen Delivery Method: Circle system utilized Preoxygenation: Pre-oxygenation with 100% oxygen Induction Type: IV induction Ventilation: Mask ventilation without difficulty Laryngoscope Size: McGraph and 4 Grade View: Grade I Tube type: Oral Tube size: 7.0 mm Number of attempts: 1 Airway Equipment and Method: Stylet and Oral airway Placement Confirmation: ETT inserted through vocal cords under direct vision,  positive ETCO2,  breath sounds checked- equal and bilateral and CO2 detector Secured at: 21 cm Tube secured with: Tape Dental Injury: Teeth and Oropharynx as per pre-operative assessment        

## 2020-06-04 NOTE — Anesthesia Preprocedure Evaluation (Signed)
Anesthesia Evaluation  Patient identified by MRN, date of birth, ID band Patient awake    Reviewed: Allergy & Precautions, NPO status , Patient's Chart, lab work & pertinent test results  History of Anesthesia Complications Negative for: history of anesthetic complications  Airway Mallampati: III  TM Distance: >3 FB Neck ROM: Full    Dental no notable dental hx.    Pulmonary neg pulmonary ROS, neg sleep apnea, neg COPD,    breath sounds clear to auscultation- rhonchi (-) wheezing      Cardiovascular hypertension, Pt. on medications (-) CAD, (-) Past MI, (-) Cardiac Stents and (-) CABG  Rhythm:Regular Rate:Normal - Systolic murmurs and - Diastolic murmurs    Neuro/Psych neg Seizures CVA (hemorrhagic), No Residual Symptoms negative psych ROS   GI/Hepatic Neg liver ROS, GERD  ,  Endo/Other  diabetes, Oral Hypoglycemic Agents  Renal/GU negative Renal ROS     Musculoskeletal  (+) Arthritis ,   Abdominal (+) + obese,   Peds  Hematology negative hematology ROS (+)   Anesthesia Other Findings Past Medical History: No date: Chicken pox No date: Diabetes mellitus type 2, uncomplicated (HCC) No date: Diabetes mellitus without complication (HCC)     Comment:  boarderline No date: Erectile dysfunction No date: GERD (gastroesophageal reflux disease) No date: History of kidney stones No date: Hypercholesterolemia No date: Hypertension 2012: Stroke [facility])     Comment:  hemorrhagic vessel   Reproductive/Obstetrics                             Anesthesia Physical Anesthesia Plan  ASA: III  Anesthesia Plan: General   Post-op Pain Management:    Induction: Intravenous  PONV Risk Score and Plan: 1 and Ondansetron and Dexamethasone  Airway Management Planned: Oral ETT  Additional Equipment:   Intra-op Plan:   Post-operative Plan: Extubation in OR  Informed Consent: I have reviewed the  patients History and Physical, chart, labs and discussed the procedure including the risks, benefits and alternatives for the proposed anesthesia with the patient or authorized representative who has indicated his/her understanding and acceptance.     Dental advisory given  Plan Discussed with: CRNA and Anesthesiologist  Anesthesia Plan Comments:         Anesthesia Quick Evaluation

## 2020-06-04 NOTE — Interval H&P Note (Signed)
History and Physical Interval Note:  06/04/2020 10:35 AM  Jesus Bell  has presented today for surgery, with the diagnosis of Left ureteral stone.  The various methods of treatment have been discussed with the patient and family. After consideration of risks, benefits and other options for treatment, the patient has consented to  Procedure(s): CYSTOSCOPY/URETEROSCOPY/HOLMIUM LASER/STENT Exchange (Left) CYSTOSCOPY WITH bladder neck BIOPSY (N/A) as a surgical intervention.  The patient's history has been reviewed, patient examined, no change in status, stable for surgery.  I have reviewed the patient's chart and labs.  Questions were answered to the patient's satisfaction.     RRR CTAB  Returns today for staged procedure Small incidental bladder neck lesion, will plan to biopsy today  Hollice Espy

## 2020-06-04 NOTE — Op Note (Signed)
Date of procedure: 06/04/20  Preoperative diagnosis:  1. Impacted left mid ureteral calculus 2. Bladder neck lesion  Postoperative diagnosis:  1. Same as above  Procedure: 1. Cystoscopy 2. Bladder neck biopsy 3. Left ureteroscopy with laser lithotripsy 4. Left ureteral stent exchange 5. Left retrograde pyelogram 6. Interpretation of fluoroscopy less than 30 minutes  Surgeon: Hollice Espy, MD  Anesthesia: General  Complications: None  Intraoperative findings: Severely impacted left mid ureteral calculus.  Able to clear visible stone but never able to pass the scope beyond the level of the stone to assess more proximal aspect of the ureter or retropulsion.  Severely stenotic ureter.  Previous 4.8 ureteral stent exchanged for a 6 [place] which is quite tight.  Small 2 mm bladder neck lesion at the left bladder neck biopsied.  EBL: Minimal  Specimens: Bladder neck lesion  Drains: 6 x 26 French double-J ureteral stent on left  Indication: Jesus Bell is a 64 y.o. patient with incidental mid left ureteral calculus on imaging with obstructing hydronephrosis.  He was previously taken to the OR at which time the obstruction was noted to be very high-grade and there was some difficulty placing a wire and ultimately a 4.8 [place] stent was able to be placed with some difficulty and the stone was unable to be treated.  He returns today for staged procedure.  Incidentally, he also had a small lesion at the bladder neck of unclear significance. After reviewing the management options for treatment, he elected to proceed with the above surgical procedure(s). We have discussed the potential benefits and risks of the procedure, side effects of the proposed treatment, the likelihood of the patient achieving the goals of the procedure, and any potential problems that might occur during the procedure or recuperation. Informed consent has been obtained.  Description of procedure:  The patient was  taken to the operating room and general anesthesia was induced.  The patient was placed in the dorsal lithotomy position, prepped and draped in the usual sterile fashion, and preoperative antibiotics were administered. A preoperative time-out was performed.   A 21 French scope was advanced per urethra into the bladder.  On scout imaging, the stone in the mid ureter could easily be seen.  The stent was seen traversing up to the upper pole calyx with a curl.  Very carefully used graspers to grasp the distalmost aspect of the stent and bring it to the level of the urethral meatus.  Upon doing so, I carefully ensure that the proximal end of the stent did not traverse it below the level of the stone.  The stent was then cannulated using a sensor wire up to the kidney.  The stent was removed and the wire was snapped in place as a safety wire.  I then used a dual-lumen to attempt to place a Super Stiff wire up to level the kidney as well.  Given the previous difficulty getting up to level the stone, elected to place an access sheath over the Super Stiff wire just distal to the level of the stone.  Remove the inner cannula but left the wire in place in order to railroad the single channel digital flexible ureteroscope to the level of the stone.  Once I reached the stone, I remove the Super Stiff wire in order to free up the working channel.  The safety wire remained in place excluded from the access sheath.  The stone was encountered.  There is significant surrounding mucosa the stone appeared to  be heavily impacted.  The wire was seen going alongside of the stone.  200 m laser fiber was then brought in and using settings of 0.8 J and 10 Hz, I began to fragment the stone.  Again, it was embedded into the mucosa circumferentially and portions of the stone had to be peeled out of the wall of the ureter.  Eventually, is able to clear all visible stone.  I injected some contrast through the scope was a retrograde pyelogram  and a small wisp of contrast was seen above the level of the stone and likely no contrast extravasation.  And then attempted to advance the scope initially under direct visualization and then using a railroad technique over a second wire across the narrowed area beyond the stone but was unsuccessful in doing so.  I made several maneuvers/attempts to advance the scope to ensure that there was no retropulsed stone fragment above this level or additional embedded stone but was unsuccessful in doing so.  I then used a basket to clear some loose stone material until the ureter was clear of stone.  I injected contrast one additional time and again no extravasation was seen.  There is some retained contrast in the collecting system with sluggish drainage.  I then backed the scope down the length of the ureter inspecting along the way.  There are no ureteral injuries and no additional significant stone fragment remaining.  I then backloaded the safety wire over rigid cystoscope.  Advanced a 6 x 26 French double-J ureteral stent over the wire under direct visualization as well as fluoroscopy.  This was quite difficult and again tight at the level of the stone consistent with ongoing high-grade obstruction/stricture and possibly residual stone material which was not visualized.  There is no obvious residual stone on fluoroscopy.  There was a nice coil created both within the renal pelvis as well as within the bladder after removing the wire.  Finally, a small mounded up area at the left bladder neck a good distance from the trigone was encountered.  This was approximately 2 mm.  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  Biopsy forceps were used to resect this area in 1 piece.  This was passed off the field as bladder neck biopsy.  The base of this was then fulgurated.  Hemostasis was excellent.  The bladder was then drained, the patient was repositioned in the supine position after being  reversed from anesthesia and taken to the PACU in stable condition.  Plan: I discussed intraoperative findings again today with his wife.  He has continued fairly significant ureteral stricture at the level of the stone I was unable to advance the scope beyond this.  I will offer him a CT scan to assess whether there is additional stone material, leaving the stent in for about 4 weeks and then attempting to remove it to allow the ureter to dilate and heal.  Alternatively and more favorably, like to leave the stent in a few more weeks, and return for third look ureteroscopy.  We will discuss these as a virtual visit later this week.  Hollice Espy, M.D.

## 2020-06-04 NOTE — Anesthesia Postprocedure Evaluation (Signed)
Anesthesia Post Note  Patient: Jesus Bell  Procedure(s) Performed: CYSTOSCOPY/URETEROSCOPY/HOLMIUM LASER/STENT Exchange (Left ) CYSTOSCOPY WITH bladder neck BIOPSY (N/A )  Patient location during evaluation: PACU Anesthesia Type: General Level of consciousness: awake and alert and oriented Pain management: pain level controlled Vital Signs Assessment: post-procedure vital signs reviewed and stable Respiratory status: spontaneous breathing, nonlabored ventilation and respiratory function stable Cardiovascular status: blood pressure returned to baseline and stable Postop Assessment: no signs of nausea or vomiting Anesthetic complications: no   No complications documented.   Last Vitals:  Vitals:   06/04/20 1307 06/04/20 1326  BP:  (!) 141/82  Pulse:  79  Resp:  20  Temp: 36.4 C 36.5 C  SpO2:  99%    Last Pain:  Vitals:   06/04/20 1326  TempSrc: Temporal  PainSc: 0-No pain                 Jasmynn Pfalzgraf

## 2020-06-04 NOTE — Discharge Instructions (Signed)
You have a ureteral stent in place.  This is a tube that extends from your kidney to your bladder.  This may cause urinary bleeding, burning with urination, and urinary frequency.  Please call our office or present to the ED if you develop fevers >101 or pain which is not able to be controlled with oral pain medications.  You may be given either Flomax and/ or ditropan to help with bladder spasms and stent pain in addition to pain medications.   ° °[facility] °[address], [address] °[place], [place] [address] °[phone] ° ° ° °AMBULATORY SURGERY  °DISCHARGE INSTRUCTIONS ° ° °1) The drugs that you were given will stay in your system until tomorrow so for the next 24 hours you should not: ° °A) Drive an automobile °B) Make any legal decisions °C) Drink any alcoholic beverage ° ° °2) You may resume regular meals tomorrow.  Today it is better to start with liquids and gradually work up to solid foods. ° °You may eat anything you prefer, but it is better to start with liquids, then soup and crackers, and gradually work up to solid foods. ° ° °3) Please notify your doctor immediately if you have any unusual bleeding, trouble breathing, redness and pain at the surgery site, drainage, fever, or pain not relieved by medication. ° ° ° °4) Additional Instructions: ° ° ° ° ° ° ° °Please contact your physician with any problems or Same Day Surgery at [phone], Monday through Friday 6 am to 4 pm, or [facility] Main number at [phone]. °

## 2020-06-04 NOTE — Transfer of Care (Signed)
Immediate Anesthesia Transfer of Care Note  Patient: Jesus Bell  Procedure(s) Performed: CYSTOSCOPY/URETEROSCOPY/HOLMIUM LASER/STENT Exchange (Left ) CYSTOSCOPY WITH bladder neck BIOPSY (N/A )  Patient Location: PACU  Anesthesia Type:General  Level of Consciousness: awake, drowsy and patient cooperative  Airway & Oxygen Therapy: Patient Spontanous Breathing and Patient connected to face mask oxygen  Post-op Assessment: Report given to RN and Post -op Vital signs reviewed and stable  Post vital signs: Reviewed and stable  Last Vitals:  Vitals Value Taken Time  BP 145/101 06/04/20 1219  Temp    Pulse 82 06/04/20 1222  Resp 15 06/04/20 1222  SpO2 100 % 06/04/20 1222  Vitals shown include unvalidated device data.  Last Pain:  Vitals:   06/04/20 1031  TempSrc: Tympanic  PainSc: 0-No pain         Complications: No complications documented.

## 2020-06-05 ENCOUNTER — Telehealth: Payer: Self-pay | Admitting: Radiology

## 2020-06-05 LAB — SURGICAL PATHOLOGY

## 2020-06-05 NOTE — Telephone Encounter (Signed)
LMOM on both cell & home numbers to return call. Need to make virtual visit appointment for post op follow up to discuss plan of care.

## 2020-06-05 NOTE — Progress Notes (Signed)
Patient chose to reschedule for in person visit  Hollice Espy, MD

## 2020-06-06 ENCOUNTER — Telehealth (INDEPENDENT_AMBULATORY_CARE_PROVIDER_SITE_OTHER): Payer: Medicare Other | Admitting: Urology

## 2020-06-06 ENCOUNTER — Telehealth: Payer: Self-pay

## 2020-06-06 ENCOUNTER — Other Ambulatory Visit: Payer: Self-pay

## 2020-06-06 DIAGNOSIS — N201 Calculus of ureter: Secondary | ICD-10-CM

## 2020-06-06 NOTE — Telephone Encounter (Signed)
Patient aware of results.

## 2020-06-06 NOTE — Telephone Encounter (Signed)
-----   Message from Hollice Espy, MD sent at 06/05/2020  4:49 PM EDT ----- Biopsy of time lesion in bladder is benign.  No cancer.    Hollice Espy, MD

## 2020-06-06 NOTE — Progress Notes (Signed)
06/08/2020 2:40 PM   Jesus Bell 09-06-1956 [id number]  Referring provider: Wenda Low, MD [place] [facility] [address],  [place] [address] Chief Complaint  Patient presents with  . Follow-up    plan of care    HPI: Jesus Bell is a 64 y.o. male who returns today to discuss plan of care. He is accompanied by his wife.   Patient saw Dr. Marry Guan on 03/13/2020 for a re-evaluation of the left hip. Patient is 2-1/2 years s/p left total hip arthroplasty. He had onset groin and anterior thigh pain since 11/2019. Denied trauma. Weightbearing activities did not aggravate the pain. Pain improved with 10-15 minutes of walking. Patient was ambulating with a walker and cane secondary to left hip weakness. Denied gross numbness or bowel/bladder dysfunction. Lumbar spine MRI was ordered.   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 L5-S1 right subarticular recess narrowing that could affect the right S1 nerve root. Widely patent spinal canal and foramina. Left hydronephrosis that is new from 2008.  CT hematuria work up 05/11/20 showed a 2.0 cm predominantly solid nodule in the right lower lobe, poorly evaluated but suspicious for primary bronchogenic neoplasm. There was a 6 mm proximal left ureteral calculus at the L4-5 level. Associated moderate left hydroureteronephrosis. Additional bilateral nonobstructing renal calculi measuring up to 3 mm.  Patient is s/p cystoscopy, bladder neck biopsy, left ureteroscopy/stent placement/retrograde pyelogram on 06/04/2020. Severely impacted left mid ureteral calculus.  Able to clear visible stone but never able to pass the scope beyond the level of the stone to assess more proximal aspect of the ureter or retropulsion.  Severely stenotic ureter.  Previous 4.8 ureteral stent exchanged for a 6 [place] which is quite tight.  Small 2 mm bladder neck lesion at the left bladder neck biopsied.  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 Negative for malignancy.   He is doing well today. He has no problem with his stent at this time.   Denies history of nephrolithiasis. Never had surgery of the abdomen.   Never smoker.  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   No previous imaging for comparison.    PMH: Past Medical History:  Diagnosis Date  . Chicken pox   . Diabetes mellitus type 2, uncomplicated ([place])   . Diabetes mellitus without complication ([place])    boarderline  . Erectile dysfunction   . GERD (gastroesophageal reflux disease)   . History of kidney stones   . Hypercholesterolemia   . Hypertension   . Stroke [facility]) 2012   hemorrhagic vessel    Surgical History: Past Surgical History:  Procedure Laterality Date  . CYSTOSCOPY W/ RETROGRADES  05/21/2020   Procedure: CYSTOSCOPY WITH RETROGRADE PYELOGRAM;  Surgeon: Hollice Espy, MD;  Location: [facility] ORS;  Service: Urology;;  . Consuela Mimes WITH BIOPSY N/A 06/04/2020   Procedure: CYSTOSCOPY WITH bladder neck BIOPSY;  Surgeon: Hollice Espy, MD;  Location: [facility] ORS;  Service: Urology;  Laterality: N/A;  . CYSTOSCOPY/URETEROSCOPY/HOLMIUM LASER/STENT PLACEMENT Left 05/21/2020   Procedure: CYSTOSCOPY/URETEROSCOPY/STENT PLACEMENT;  Surgeon: Hollice Espy, MD;  Location: [facility] ORS;  Service: Urology;  Laterality: Left;  . CYSTOSCOPY/URETEROSCOPY/HOLMIUM LASER/STENT PLACEMENT Left 06/04/2020   Procedure: CYSTOSCOPY/URETEROSCOPY/HOLMIUM LASER/STENT Exchange;  Surgeon: Hollice Espy, MD;  Location: [facility] ORS;  Service: Urology;  Laterality: Left;  . SHOULDER SURGERY Bilateral    clavicle  . TOTAL HIP ARTHROPLASTY Left 08/17/2017   Procedure: TOTAL HIP ARTHROPLASTY;  Surgeon: Dereck Leep, MD;  Location: [facility] ORS;  Service: Orthopedics;  Laterality: Left;  . WRIST FUSION Left     Home Medications:  Allergies as of 06/08/2020      Reactions   Nsaids Other (See Comments)   History of  hemorrhagic stroke. No nsaids or aspirin   Aspirin Other (See Comments)   States cannot take due to h/o hemorrhagic stroke States cannot take due to h/o hemorrhagic stroke      Medication List       Accurate as of June 08, 2020 11:59 PM. If you have any questions, ask your nurse or doctor.        acetaminophen 325 MG tablet Commonly known as: TYLENOL Take 650 mg by mouth every 4 (four) hours as needed.   amLODipine 10 MG tablet Commonly known as: NORVASC Take 10 mg by mouth daily.   clobetasol ointment 0.05 % Commonly known as: TEMOVATE Apply 1 application topically 2 (two) times daily as needed (pain.).   lovastatin 40 MG tablet Commonly known as: MEVACOR Take 40 mg by mouth daily.   metFORMIN 1000 MG tablet Commonly known as: GLUCOPHAGE Take 1,000 mg by mouth in the morning and at bedtime.   omeprazole 40 MG capsule Commonly known as: PRILOSEC Take 40 mg by mouth daily.   oxybutynin 5 MG tablet Commonly known as: DITROPAN Take 1 tablet (5 mg total) by mouth every 8 (eight) hours as needed for bladder spasms.   tamsulosin 0.4 MG Caps capsule Commonly known as: Flomax Take 1 capsule (0.4 mg total) by mouth daily.   Viagra 100 MG tablet Generic drug: sildenafil Take 100 mg by mouth as needed for erectile dysfunction.   Vitamin D3 125 MCG (5000 UT) Tabs Take 5,000 Units by mouth daily.       Allergies:  Allergies  Allergen Reactions  . Nsaids Other (See Comments)    History of hemorrhagic stroke. No nsaids or aspirin   . Aspirin Other (See Comments)    States cannot take due to h/o hemorrhagic stroke States cannot take due to h/o hemorrhagic stroke     Family History: No family history on file.  Social History:  reports that he has never smoked. He has never used smokeless tobacco. He reports that he does not drink alcohol and does not use drugs.   Physical Exam: BP (!) 147/84   Pulse 89   Ht 5\' 8"  (1.727 m)   Wt 256 lb (116.1 kg)   BMI  38.92 kg/m   Constitutional:  Alert and oriented, No acute distress. HEENT: [place] AT, moist mucus membranes.  Trachea midline, no masses. Cardiovascular: No clubbing, cyanosis, or edema. Respiratory: Normal respiratory effort, no increased work of breathing. Skin: No rashes, bruises or suspicious lesions. Neurologic: Grossly intact, no focal deficits, moving all 4 extremities. Psychiatric: Normal mood and affect.  Laboratory Data:  Lab Results  Component Value Date   CREATININE 1.20 05/04/2020    Assessment & Plan:    1. Left ureteral stone/hydronephrosis -S/p ureteroscopy, stent placement, retrograde pyelogram 06/04/2020. -Pathology revealed cystitis cystica. Negative for malignancy.  -I debriefed patient on operative findings.  - Possible residual stone fragments and very high risk for stricture given heavily impacted's stone and presence of stricture initially and on follow-up ureteroscopy  -Offered a follow up CT scan versus returning to the OR for a third look and possible dilation of strictured area -Discussed that if he does in fact develop a mid ureteral stricture, this will  be difficult to manage which may include some sort of definitive reconstructive surgery [facility] at [facility] versus chronic indwelling stent -Whole body PET-scan is scheduled for 06/12/2020, will be able to assess for additional fragments proximal to stricture   [facility] [address] [place], [place] [address] [phone]  I, Selena Batten, am acting as a scribe for Dr. Hollice Espy.  I have reviewed the above documentation for accuracy and completeness, and I agree with the above.   Hollice Espy, MD   I spent 30 total minutes on the day of the encounter including pre-visit review of the medical record, face-to-face time with the patient, and post visit ordering of labs/imaging/tests.   Marland Kitchen

## 2020-06-08 ENCOUNTER — Encounter: Payer: Self-pay | Admitting: Urology

## 2020-06-08 ENCOUNTER — Other Ambulatory Visit: Payer: Self-pay

## 2020-06-08 ENCOUNTER — Ambulatory Visit (INDEPENDENT_AMBULATORY_CARE_PROVIDER_SITE_OTHER): Payer: Medicare Other | Admitting: Urology

## 2020-06-08 VITALS — BP 147/84 | HR 89 | Ht 68.0 in | Wt 256.0 lb

## 2020-06-08 DIAGNOSIS — N1330 Unspecified hydronephrosis: Secondary | ICD-10-CM

## 2020-06-08 DIAGNOSIS — N201 Calculus of ureter: Secondary | ICD-10-CM

## 2020-06-08 LAB — CALCULI, WITH PHOTOGRAPH (CLINICAL LAB)
Calcium Oxalate Monohydrate: 100 %
Weight Calculi: 1 mg

## 2020-06-12 ENCOUNTER — Other Ambulatory Visit: Payer: Self-pay

## 2020-06-12 ENCOUNTER — Encounter
Admission: RE | Admit: 2020-06-12 | Discharge: 2020-06-12 | Disposition: A | Discharge status: Home / Self Care | Payer: Medicare Other | Source: Ambulatory Visit | Attending: Urology | Admitting: Urology

## 2020-06-12 ENCOUNTER — Encounter: Payer: Self-pay | Admitting: Internal Medicine

## 2020-06-12 DIAGNOSIS — I700 Atherosclerosis of aorta: Secondary | ICD-10-CM | POA: Diagnosis not present

## 2020-06-12 DIAGNOSIS — I517 Cardiomegaly: Secondary | ICD-10-CM | POA: Diagnosis not present

## 2020-06-12 DIAGNOSIS — K760 Fatty (change of) liver, not elsewhere classified: Secondary | ICD-10-CM | POA: Insufficient documentation

## 2020-06-12 DIAGNOSIS — R911 Solitary pulmonary nodule: Secondary | ICD-10-CM | POA: Insufficient documentation

## 2020-06-12 MED ORDER — FLUDEOXYGLUCOSE F - 18 (FDG) INJECTION
12.9900 | Freq: Once | INTRAVENOUS | Status: AC | PRN
  Administered 2020-06-12: 12.99 via INTRAVENOUS

## 2020-06-13 ENCOUNTER — Inpatient Hospital Stay: Payer: Medicare Other | Attending: Internal Medicine | Admitting: Internal Medicine

## 2020-06-13 ENCOUNTER — Inpatient Hospital Stay: Payer: Medicare Other

## 2020-06-13 ENCOUNTER — Encounter: Payer: Self-pay | Admitting: *Deleted

## 2020-06-13 ENCOUNTER — Encounter: Payer: Self-pay | Admitting: Internal Medicine

## 2020-06-13 DIAGNOSIS — M1990 Unspecified osteoarthritis, unspecified site: Secondary | ICD-10-CM | POA: Insufficient documentation

## 2020-06-13 DIAGNOSIS — K862 Cyst of pancreas: Secondary | ICD-10-CM | POA: Diagnosis not present

## 2020-06-13 DIAGNOSIS — Z79899 Other long term (current) drug therapy: Secondary | ICD-10-CM | POA: Diagnosis not present

## 2020-06-13 DIAGNOSIS — K760 Fatty (change of) liver, not elsewhere classified: Secondary | ICD-10-CM | POA: Insufficient documentation

## 2020-06-13 DIAGNOSIS — R918 Other nonspecific abnormal finding of lung field: Secondary | ICD-10-CM

## 2020-06-13 DIAGNOSIS — N1330 Unspecified hydronephrosis: Secondary | ICD-10-CM | POA: Insufficient documentation

## 2020-06-13 LAB — CBC WITH DIFFERENTIAL/PLATELET
Abs Immature Granulocytes: 0.02 10*3/uL (ref 0.00–0.07)
Basophils Absolute: 0 10*3/uL (ref 0.0–0.1)
Basophils Relative: 1 %
Eosinophils Absolute: 0.2 10*3/uL (ref 0.0–0.5)
Eosinophils Relative: 4 %
HCT: 36.8 % — ABNORMAL LOW (ref 39.0–52.0)
Hemoglobin: 12.9 g/dL — ABNORMAL LOW (ref 13.0–17.0)
Immature Granulocytes: 0 %
Lymphocytes Relative: 21 %
Lymphs Abs: 1.3 10*3/uL (ref 0.7–4.0)
MCH: 24.6 pg — ABNORMAL LOW (ref 26.0–34.0)
MCHC: 35.1 g/dL (ref 30.0–36.0)
MCV: 70.1 fL — ABNORMAL LOW (ref 80.0–100.0)
Monocytes Absolute: 0.8 10*3/uL (ref 0.1–1.0)
Monocytes Relative: 13 %
Neutro Abs: 3.9 10*3/uL (ref 1.7–7.7)
Neutrophils Relative %: 61 %
Platelets: 355 10*3/uL (ref 150–400)
RBC: 5.25 MIL/uL (ref 4.22–5.81)
RDW: 15.6 % — ABNORMAL HIGH (ref 11.5–15.5)
WBC: 6.2 10*3/uL (ref 4.0–10.5)
nRBC: 0 % (ref 0.0–0.2)

## 2020-06-13 LAB — COMPREHENSIVE METABOLIC PANEL
ALT: 15 U/L (ref 0–44)
AST: 22 U/L (ref 15–41)
Albumin: 4 g/dL (ref 3.5–5.0)
Alkaline Phosphatase: 103 U/L (ref 38–126)
Anion gap: 11 (ref 5–15)
BUN: 10 mg/dL (ref 8–23)
CO2: 26 mmol/L (ref 22–32)
Calcium: 9.4 mg/dL (ref 8.9–10.3)
Chloride: 102 mmol/L (ref 98–111)
Creatinine, Ser: 1.03 mg/dL (ref 0.61–1.24)
GFR calc Af Amer: >60 mL/min (ref >60)
GFR calc non Af Amer: >60 mL/min (ref >60)
Glucose, Bld: 189 mg/dL — ABNORMAL HIGH (ref 70–99)
Potassium: 3.2 mmol/L — ABNORMAL LOW (ref 3.5–5.1)
Sodium: 139 mmol/L (ref 135–145)
Total Bilirubin: 0.6 mg/dL (ref 0.3–1.2)
Total Protein: 7.5 g/dL (ref 6.5–8.1)

## 2020-06-13 NOTE — Assessment & Plan Note (Addendum)
#   RLL nodule ~approximately 2 cm; SUV 1.8-slightly progressed over 13 years-differential mucoid impaction/benign etiology or very slowly growing adenocarcinoma.  Await discussion of the tumor conference.  #Incidental PET scan findings of -peripancreatic/close to duodenum 2.9 cm lymph node [SUV 6.3] mild uptake ~1.2 cm mass[ SUV 5.9 on pancreatic head.  Patient is asymptomatic.  Recommend MRI abdomen with and without contrast.  Check tumor marker labs.  If concerning on MRI imaging would recommend endoscopic ultrasound/biopsy.  #Left hydronephrosis/kidney stone-s/p stenting extraction-none noted on PET scan.  Defer to urology.  # #Incidental findings of fatty liver; atherosclerosis; degenerative arthritis-on the PET scan discussed with the patient. Will continue to follow up with PCP.   Thank you Dr.Brandon for allowing me to participate in the care of your pleasant patient. Please do not hesitate to contact me with questions or concerns in the interim.  We will review imaging at the tumor conference on 9/23.    #  DISPOSITION: # Labs- cbc/[id number]- please order # MRI ASAP # follow up MD- 1-2 days after MRI- no labs- Dr.B  # I reviewed the blood work- with the patient in detail; also reviewed the imaging independently [as summarized above]; and with the patient in detail.   Cc; Dr.Brandon/Dr.Hussain

## 2020-06-13 NOTE — Progress Notes (Signed)
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     HISTORY OF PRESENTING ILLNESS:  Jesus Bell 64 y.o.  male NO history of smoking is here for further evaluation and recommendations for lung mass/nodule/review results of the PET scan.  Patient was evaluated by Dr. Marry Guan in June for left hip pain with an MRI of the lumbar spine.  Patient incidentally noted to have left hydronephrosis that was new from 2008.  This led to hematuria work-up in August 2021 which showed approximate 2 cm right lower lobe lung nodule suspicious of malignancy.  Patient had stone extraction/stent placement left kidney for the hydronephrosis in September 2021.  Patient also had a CT scan chest and also PET scan for further evaluation.  Patient denies any shortness of breath or cough he denies any nausea vomiting but denies abdominal pain pain no weight loss.  Review of Systems  Constitutional: Negative for chills, diaphoresis, fever, malaise/fatigue and weight loss.  HENT: Negative for nosebleeds and sore throat.   Eyes: Negative for double vision.  Respiratory: Negative for cough, hemoptysis, sputum production, shortness of breath and wheezing.   Cardiovascular: Negative for chest pain, palpitations, orthopnea and leg swelling.  Gastrointestinal: Negative for abdominal pain,  blood in stool, constipation, diarrhea, heartburn, melena, nausea and vomiting.  Genitourinary: Negative for dysuria, frequency and urgency.  Musculoskeletal: Positive for back pain and joint pain.  Skin: Negative.  Negative for itching and rash.  Neurological: Negative for dizziness, tingling, focal weakness, weakness and headaches.  Endo/Heme/Allergies: Does not bruise/bleed easily.  Psychiatric/Behavioral: Negative for depression. The patient is not nervous/anxious and does not have insomnia.      MEDICAL HISTORY:  Past Medical History:  Diagnosis Date  . Chicken pox   . Diabetes mellitus type 2, uncomplicated ([place])   . Diabetes mellitus without complication ([place])    boarderline  . Erectile dysfunction   . GERD (gastroesophageal reflux disease)   . History of kidney stones   . Hypercholesterolemia   . Hypertension   . Stroke [facility]) 2012   hemorrhagic vessel    SURGICAL HISTORY: Past Surgical History:  Procedure Laterality Date  . CYSTOSCOPY W/ RETROGRADES  05/21/2020   Procedure: CYSTOSCOPY WITH RETROGRADE PYELOGRAM;  Surgeon: Hollice Espy, MD;  Location: [facility] ORS;  Service: Urology;;  . Consuela Mimes WITH BIOPSY N/A 06/04/2020   Procedure: CYSTOSCOPY WITH bladder neck BIOPSY;  Surgeon: Hollice Espy, MD;  Location: [facility] ORS;  Service: Urology;  Laterality: N/A;  . CYSTOSCOPY/URETEROSCOPY/HOLMIUM LASER/STENT PLACEMENT Left 05/21/2020   Procedure: CYSTOSCOPY/URETEROSCOPY/STENT PLACEMENT;  Surgeon: Hollice Espy, MD;  Location: [facility] ORS;  Service: Urology;  Laterality: Left;  . CYSTOSCOPY/URETEROSCOPY/HOLMIUM LASER/STENT PLACEMENT Left 06/04/2020   Procedure: CYSTOSCOPY/URETEROSCOPY/HOLMIUM LASER/STENT Exchange;  Surgeon: Hollice Espy, MD;  Location: [facility] ORS;  Service: Urology;  Laterality: Left;  . SHOULDER SURGERY Bilateral    clavicle  . TOTAL HIP ARTHROPLASTY Left 08/17/2017   Procedure: TOTAL HIP ARTHROPLASTY;  Surgeon: Dereck Leep,  MD;  Location: [facility] ORS;   Service: Orthopedics;  Laterality: Left;  . WRIST FUSION Left     SOCIAL HISTORY: Social History   Socioeconomic History  . Marital status: Married    Spouse name: [name]  . Number of children: 4  . Years of education: 47  . Highest education level: High school graduate  Occupational History  . Not on file  Tobacco Use  . Smoking status: Never Smoker  . Smokeless tobacco: Never Used  Vaping Use  . Vaping Use: Never used  Substance and Sexual Activity  . Alcohol use: No  . Drug use: No  . Sexual activity: Not on file  Other Topics Concern  . 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 [place] With home. 4 children.    Social Determinants of Health   Financial Resource Strain:   . Difficulty of Paying Living Expenses: Not on file  Food Insecurity:   . Worried About Charity fundraiser in the Last Year: Not on file  . Ran Out of Food in the Last Year: Not on file  Transportation Needs:   . Lack of Transportation (Medical): Not on file  . Lack of Transportation (Non-Medical): Not on file  Physical Activity:   . Days of Exercise per Week: Not on file  . Minutes of Exercise per Session: Not on file  Stress:   . Feeling of Stress : Not on file  Social Connections:   . Frequency of Communication with Friends and Family: Not on file  . Frequency of Social Gatherings with Friends and Family: Not on file  . Attends Religious Services: Not on file  . Active Member of Clubs or Organizations: Not on file  . Attends Archivist Meetings: Not on file  . Marital Status: Not on file  Intimate Partner Violence:   . Fear of Current or Ex-Partner: Not on file  . Emotionally Abused: Not on file  . Physically Abused: Not on file  . Sexually Abused: Not on file    FAMILY HISTORY: Family History  Family history unknown: Yes    ALLERGIES:  is allergic to nsaids and  aspirin.  MEDICATIONS:  Current Outpatient Medications  Medication Sig Dispense Refill  . acetaminophen (TYLENOL) 325 MG tablet Take 650 mg by mouth every 4 (four) hours as needed.     Marland Kitchen amLODipine (NORVASC) 10 MG tablet Take 10 mg by mouth daily.     . Cholecalciferol (VITAMIN D3) 125 MCG (5000 UT) TABS Take 5,000 Units by mouth daily.    . clobetasol ointment (TEMOVATE) 4.97 % Apply 1 application topically 2 (two) times daily as needed (pain.).     Marland Kitchen lovastatin (MEVACOR) 40 MG tablet Take 40 mg by mouth daily.     . metFORMIN (GLUCOPHAGE) 1000 MG tablet Take 1,000 mg by mouth in the morning and at bedtime.     Marland Kitchen omeprazole (PRILOSEC) 40 MG capsule Take 40 mg by mouth daily.     Marland Kitchen oxybutynin (DITROPAN) 5 MG tablet Take 1 tablet (5 mg total) by mouth every 8 (eight) hours as needed for bladder spasms. 30 tablet 0  . sildenafil (VIAGRA) 100 MG tablet Take 100 mg by mouth as needed for erectile dysfunction.     . tamsulosin (FLOMAX) 0.4 MG CAPS capsule Take 1 capsule (0.4 mg total) by mouth daily. 30 capsule 0   No current facility-administered  medications for this visit.      Marland Kitchen  PHYSICAL EXAMINATION: ECOG PERFORMANCE STATUS: 0 - Asymptomatic  Vitals:   06/13/20 1356  BP: (!) 147/93  Pulse: 81  Resp: 16  Temp: 97.9 F (36.6 C)  SpO2: 100%   Filed Weights   06/13/20 1356  Weight: 251 lb (113.9 kg)    Physical Exam HENT:     Head: Normocephalic and atraumatic.     Mouth/Throat:     Pharynx: No oropharyngeal exudate.  Eyes:     Pupils: Pupils are equal, round, and reactive to light.  Cardiovascular:     Rate and Rhythm: Normal rate and regular rhythm.  Pulmonary:     Effort: Pulmonary effort is normal. No respiratory distress.     Breath sounds: Normal breath sounds. No wheezing.  Abdominal:     General: Bowel sounds are normal. There is no distension.     Palpations: Abdomen is soft. There is no mass.     Tenderness: There is no abdominal tenderness. There is no  guarding or rebound.  Musculoskeletal:        General: No tenderness. Normal range of motion.     Cervical back: Normal range of motion and neck supple.  Skin:    General: Skin is warm.  Neurological:     Mental Status: He is alert and oriented to person, place, and time.  Psychiatric:        Mood and Affect: Affect normal.      LABORATORY DATA:  I have reviewed the data as listed Lab Results  Component Value Date   WBC 6.2 06/13/2020   HGB 12.9 (L) 06/13/2020   HCT 36.8 (L) 06/13/2020   MCV 70.1 (L) 06/13/2020   PLT 355 06/13/2020   Recent Labs    04/19/20 0912 05/04/20 1323 06/13/20 1500  NA 142  --  139  K 3.9  --  3.2*  CL 102  --  102  CO2 26  --  26  GLUCOSE 133*  --  189*  BUN 9  --  10  CREATININE 1.04 1.20 1.03  CALCIUM 9.7  --  9.4  GFRNONAA 76  --  >60  GFRAA 88  --  >60  PROT  --   --  7.5  ALBUMIN  --   --  4.0  AST  --   --  22  ALT  --   --  15  ALKPHOS  --   --  103  BILITOT  --   --  0.6    RADIOGRAPHIC STUDIES: I have personally reviewed the radiological images as listed and agreed with the findings in the report. CT Chest W Contrast  Result Date: 05/23/2020 CLINICAL DATA:  Follow-up pulmonary nodule EXAM: CT CHEST WITH CONTRAST TECHNIQUE: Multidetector CT imaging of the chest was performed during intravenous contrast administration. CONTRAST:  34mL OMNIPAQUE IOHEXOL 300 MG/ML  SOLN COMPARISON:  Partial comparison to CT abdomen/pelvis dated 05/04/2020 and 08/08/2007. FINDINGS: Cardiovascular: Heart is normal in size.  No pericardial effusion. No evidence of thoracic aortic aneurysm. Mediastinum/Nodes: Small mediastinal lymph nodes which do not meet pathologic CT size criteria. Bilateral thyroid nodules, including a dominant 3.1 cm right thyroid nodule (series 2/image 14). Lungs/Pleura: 2.0 x 1.8 cm predominantly solid nodule in the posterior right lower lobe (series 3/images 89-93), unchanged from recent CT, progressive from 2008. This remains  highly suspicious for primary bronchogenic neoplasm, specifically invasive adenocarcinoma. Additional scattered small subpleural nodules measuring up to 6  mm (series 3/image 68), likely benign. No focal consolidation. No pleural effusion or pneumothorax. Upper Abdomen: Visualized upper abdomen is grossly unremarkable, noting mild hepatic steatosis. Musculoskeletal: Mild degenerative changes of the visualized thoracolumbar spine. IMPRESSION: 2.0 cm predominantly solid nodule in the posterior right lower lobe, highly suspicious for primary bronchogenic neoplasm, specifically invasive adenocarcinoma. Discussion at multidisciplinary tumor board is suggested. Consider percutaneous sampling and/or PET-CT. Aortic Atherosclerosis (ICD10-I70.0). Electronically Signed   By: Julian Hy M.D.   On: 05/23/2020 12:45   NM PET Image Initial (PI) Skull Base To Thigh  Result Date: 06/12/2020 CLINICAL DATA:  Initial treatment strategy for right lower lobe lung nodule. EXAM: NUCLEAR MEDICINE PET SKULL BASE TO THIGH TECHNIQUE: 13.0 mCi F-18 FDG was injected intravenously. Full-ring PET imaging was performed from the skull base to thigh after the radiotracer. CT data was obtained and used for attenuation correction and anatomic localization. Fasting blood glucose: 132 mg/dl COMPARISON:  Chest CT 05/23/2020 and abdomen CT from 05/04/2020 FINDINGS: Mediastinal blood pool activity: SUV max 2.8 Liver activity: SUV max N/A NECK: A small lymph node or accessory salivary gland in the right submandibular region measures 1.0 cm in short axis on image 49/3. This has a maximum SUV of 3.6. Incidental CT findings: Photopenic 2.9 by 2.0 cm right thyroid nodule. Recommend thyroid US (ref: J Am Coll Radiol. 2015 Feb;12(2): 143-50). Chronic left posterior ethmoid and right sphenoid sinusitis along with chronic right maxillary sinusitis. CHEST: The multilobular right lower lobe nodule measures about 2.3 by 1.6 cm on image 119 of series 3,  maximum SUV 1.4 which is not substantially increased compared to background activity in the contralateral left lower lobe. A left lower lobe nodule measuring 0.7 cm in short axis on image 96/3 has maximum SUV of 1.7. A 0.5 cm right middle lobe nodule on image 104/3 is not hypermetabolic but is below sensitive PET-CT size thresholds. Other small nodules are similarly below PET-CT size thresholds and not appreciably hypermetabolic. No hypermetabolic or pathologically enlarged adenopathy is observed in the chest. Incidental CT findings: Mild cardiomegaly. Mild atherosclerosis of the proximal ascending thoracic aorta. ABDOMEN/PELVIS: Physiologic activity in bowel. Hypermetabolic lymph node or exophytic lesion from the pancreas, bowel, or less likely kidney measuring 2.9 by 1.7 cm on image 169 of series 3 maximum SUV 6.3. Subtle 1.2 cm hypodensity in the pancreatic head on image 158/3 with activity in this vicinity maximum SUV up to 5.9, compared to background pancreatic activity up to about 3.0, the possibility of a small pancreatic neoplasm is not excluded in the pancreatic head. Speckled/mottled activity in the liver is probably incidental. Incidental CT findings: Diffuse hepatic steatosis. Left double-J ureteral stent appears satisfactorily position. SKELETON: Fused SI joints with subtle accentuated activity along the left SI joint, probably incidental. Photopenic left hip prosthesis. Incidental CT findings: Degenerative sternoclavicular arthropathy. IMPRESSION: 1. The multilobular right lower lobe nodule has a maximum SUV of 1.4. On 08/07/2007 there was some branching nodularity in this vicinity suspicious for mucoid impaction, and the fairly minimal progression over the last 13 years favors a benign etiology. This would be extremely indolent for low-grade adenocarcinoma given the only slight progression in 13 years. 2. Other small pulmonary nodules are stable from recent chest CT but technically too small  characterize. Consider surveillance CT chest in 1 years time. 3. There are two lesions of concern in the upper abdomen. First of all there is a 2.9 by 1.7 cm lesion along the inferior margin of the descending duodenum which demonstrates  mild accentuated metabolic activity in which is probably a mildly enlarged lymph node. Also there is a small hypodensity in the pancreatic head which is mildly accentuated in metabolic activity. I recommend pancreatic protocol MRI with and without contrast to further workup and characterize this potential lesion in the pancreatic head. 4. Small lymph node or excessive very salivary gland in the right submandibular region, probably incidental. 5. Other imaging findings of potential clinical significance: Chronic paranasal sinusitis. Mild cardiomegaly. Aortic Atherosclerosis (ICD10-I70.0). Diffuse hepatic steatosis. Fused sacroiliac joints. Degenerative sternoclavicular arthropathy. Electronically Signed   By: Van Clines M.D.   On: 06/12/2020 14:44   DG OR UROLOGY CYSTO IMAGE ([facility] ONLY)  Result Date: 06/04/2020 There is no interpretation for this exam.  This order is for images obtained during a surgical procedure.  Please See "Surgeries" Tab for more information regarding the procedure.   DG OR UROLOGY CYSTO IMAGE ([facility] ONLY)  Result Date: 05/21/2020 There is no interpretation for this exam.  This order is for images obtained during a surgical procedure.  Please See "Surgeries" Tab for more information regarding the procedure.    ASSESSMENT & PLAN:   Right lower lobe lung mass # RLL nodule ~approximately 2 cm; SUV 1.8-slightly progressed over 13 years-differential mucoid impaction/benign etiology or very slowly growing adenocarcinoma.  Await discussion of the tumor conference.  #Incidental PET scan findings of -peripancreatic/close to duodenum 2.9 cm lymph node [SUV 6.3] mild uptake ~1.2 cm mass[ SUV 5.9 on pancreatic head.  Patient is asymptomatic.  Recommend  MRI abdomen with and without contrast.  Check tumor marker labs.  If concerning on MRI imaging would recommend endoscopic ultrasound/biopsy.  #Left hydronephrosis/kidney stone-s/p stenting extraction-none noted on PET scan.  Defer to urology.  # #Incidental findings of fatty liver; atherosclerosis; degenerative arthritis-on the PET scan discussed with the patient. Will continue to follow up with PCP.   Thank you Dr.Brandon for allowing me to participate in the care of your pleasant patient. Please do not hesitate to contact me with questions or concerns in the interim.  We will review imaging at the tumor conference on 9/23.    #  DISPOSITION: # Labs- cbc/cmp/ca-19-9- please order # MRI ASAP # follow up MD- 1-2 days after MRI- no labs- Dr.B  # I reviewed the blood work- with the patient in detail; also reviewed the imaging independently [as summarized above]; and with the patient in detail.   Cc; Dr.Brandon/Dr.Hussain     All questions were answered. The patient knows to call the clinic with any problems, questions or concerns.    Cammie Sickle, MD 06/13/2020 3:27 PM

## 2020-06-13 NOTE — Progress Notes (Signed)
°  Oncology Nurse Navigator Documentation  Navigator Location: [facility] (06/13/20 0900)   )Navigator Encounter Type: Scan Review (06/13/20 0900)            PET scan results reviewed. Lung nodule most likely benign but requires follow up imaging. Lung navigation not needed at this time. Pancreatic lesion noted on scan and will forward results to Mariea Clonts to further follow for navigation.                                         Time Spent with Patient: 15 (06/13/20 0900)

## 2020-06-14 ENCOUNTER — Other Ambulatory Visit: Payer: Medicare Other

## 2020-06-14 LAB — CANCER ANTIGEN 19-9: CA 19-9: 15 U/mL (ref 0–35)

## 2020-06-14 LAB — GLUCOSE, CAPILLARY: Glucose-Capillary: 132 mg/dL — ABNORMAL HIGH (ref 70–99)

## 2020-06-15 ENCOUNTER — Ambulatory Visit
Admission: RE | Admit: 2020-06-15 | Discharge: 2020-06-15 | Disposition: A | Discharge status: Home / Self Care | Payer: Medicare Other | Source: Ambulatory Visit | Attending: Internal Medicine | Admitting: Internal Medicine

## 2020-06-15 ENCOUNTER — Telehealth: Payer: Self-pay | Admitting: Urology

## 2020-06-15 ENCOUNTER — Other Ambulatory Visit: Payer: Self-pay

## 2020-06-15 ENCOUNTER — Other Ambulatory Visit: Payer: Self-pay | Admitting: Radiology

## 2020-06-15 DIAGNOSIS — N135 Crossing vessel and stricture of ureter without hydronephrosis: Secondary | ICD-10-CM

## 2020-06-15 DIAGNOSIS — K862 Cyst of pancreas: Secondary | ICD-10-CM | POA: Insufficient documentation

## 2020-06-15 DIAGNOSIS — N201 Calculus of ureter: Secondary | ICD-10-CM

## 2020-06-15 MED ORDER — GADOBUTROL 1 MMOL/ML IV SOLN
10.0000 mL | Freq: Once | INTRAVENOUS | Status: AC | PRN
  Administered 2020-06-15: 10 mL via INTRAVENOUS

## 2020-06-15 NOTE — Progress Notes (Signed)
Tumor Board Documentation  Jesus Bell was presented by Dr Rogue Bussing at our Tumor Board on 06/14/2020, which included representatives from medical oncology, radiation oncology, surgical oncology, internal medicine, navigation, pathology, radiology, surgical, pharmacy, research, palliative care.  Jesus Bell currently presents as a new patient, for discussion with history of the following treatments: active survellience.  Additionally, we reviewed previous medical and familial history, history of present illness, and recent lab results along with all available histopathologic and imaging studies. The tumor board considered available treatment options and made the following recommendations: Additional screening (MRI Pancreas) No further followup on Lung nodule  The following procedures/referrals were also placed: No orders of the defined types were placed in this encounter.   Clinical Trial Status: not discussed   Staging used: Not Applicable  National site-specific guidelines   were discussed with respect to the case.  Tumor board is a meeting of clinicians from various specialty areas who evaluate and discuss patients for whom a multidisciplinary approach is being considered. Final determinations in the plan of care are those of the provider(s). The responsibility for follow up of recommendations given during tumor board is that of the provider.   Todays extended care, comprehensive team conference, [name] was not present for the discussion and was not examined.   Multidisciplinary Tumor Board is a multidisciplinary case peer review process.  Decisions discussed in the Multidisciplinary Tumor Board reflect the opinions of the specialists present at the conference without having examined the patient.  Ultimately, treatment and diagnostic decisions rest with the primary provider(s) and the patient.

## 2020-06-15 NOTE — Telephone Encounter (Signed)
CT from PET scan reviewed with Dr. Kathlene Cote.  Small residual ureteral stone burden.    Based on our conversation in the office, will plan to return to the operating room to treat this residual stone burden as well as attempt at balloon dilating the stricture as needed.  Called the patient, left detailed message on his phone.  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  He will need another preoperative urine culture.  I like this to be booked in a few weeks.  Ancef 2 g.  SCDs.  Hollice Espy, MD

## 2020-06-18 ENCOUNTER — Telehealth: Payer: Self-pay | Admitting: Internal Medicine

## 2020-06-18 ENCOUNTER — Inpatient Hospital Stay: Payer: Medicare Other | Admitting: Internal Medicine

## 2020-06-18 NOTE — Telephone Encounter (Signed)
Left VM for pt re: tumor conference discussion. Will await repeat imaging as planned.   GB

## 2020-06-19 ENCOUNTER — Telehealth: Payer: Self-pay | Admitting: Internal Medicine

## 2020-06-19 DIAGNOSIS — K862 Cyst of pancreas: Secondary | ICD-10-CM

## 2020-06-19 NOTE — Telephone Encounter (Signed)
Spoke with pt in regards to message below. Pt expressed understanding. Will follow up in 6 months.

## 2020-06-19 NOTE — Telephone Encounter (Signed)
On 9/27-left voicemail for the patient regarding the results of the MRI-benign-appearing pancreatic cysts; reactive peripancreatic lymph node.  As recommended by radiology-would recommend repeating imaging in 6 months.  H/T-please reach out to patient/wife with my above recommendations.   C-schedule in 6 months; MD labs CBC CMP; ca-19-9; MRI abdomen 1-2 days prior.   Thanks, GB

## 2020-06-19 NOTE — Telephone Encounter (Signed)
LVM for pt to call back in regards to message below.  

## 2020-06-19 NOTE — Addendum Note (Signed)
Addended by: Gloris Ham on: 06/19/2020 09:31 AM   Modules accepted: Orders

## 2020-06-21 DIAGNOSIS — M48061 Spinal stenosis, lumbar region without neurogenic claudication: Secondary | ICD-10-CM | POA: Insufficient documentation

## 2020-06-21 DIAGNOSIS — G8929 Other chronic pain: Secondary | ICD-10-CM | POA: Insufficient documentation

## 2020-06-27 ENCOUNTER — Emergency Department: Payer: Medicare Other

## 2020-06-27 ENCOUNTER — Other Ambulatory Visit: Payer: Self-pay

## 2020-06-27 ENCOUNTER — Inpatient Hospital Stay: Payer: Medicare Other | Attending: Internal Medicine

## 2020-06-27 ENCOUNTER — Encounter: Payer: Self-pay | Admitting: Intensive Care

## 2020-06-27 ENCOUNTER — Emergency Department
Admission: EM | Admit: 2020-06-27 | Discharge: 2020-06-27 | Disposition: A | Discharge status: Home / Self Care | Payer: Medicare Other | Attending: Emergency Medicine | Admitting: Emergency Medicine

## 2020-06-27 DIAGNOSIS — M545 Low back pain, unspecified: Secondary | ICD-10-CM

## 2020-06-27 DIAGNOSIS — Z7984 Long term (current) use of oral hypoglycemic drugs: Secondary | ICD-10-CM | POA: Insufficient documentation

## 2020-06-27 DIAGNOSIS — Z79899 Other long term (current) drug therapy: Secondary | ICD-10-CM | POA: Insufficient documentation

## 2020-06-27 DIAGNOSIS — E119 Type 2 diabetes mellitus without complications: Secondary | ICD-10-CM | POA: Insufficient documentation

## 2020-06-27 DIAGNOSIS — I10 Essential (primary) hypertension: Secondary | ICD-10-CM | POA: Diagnosis not present

## 2020-06-27 DIAGNOSIS — M5459 Other low back pain: Secondary | ICD-10-CM | POA: Insufficient documentation

## 2020-06-27 DIAGNOSIS — Z96642 Presence of left artificial hip joint: Secondary | ICD-10-CM | POA: Insufficient documentation

## 2020-06-27 DIAGNOSIS — N200 Calculus of kidney: Secondary | ICD-10-CM

## 2020-06-27 DIAGNOSIS — M549 Dorsalgia, unspecified: Secondary | ICD-10-CM | POA: Diagnosis present

## 2020-06-27 LAB — URINALYSIS, COMPLETE (UACMP) WITH MICROSCOPIC
Bacteria, UA: NONE SEEN
Bilirubin Urine: NEGATIVE
Glucose, UA: NEGATIVE mg/dL
Ketones, ur: NEGATIVE mg/dL
Nitrite: NEGATIVE
Protein, ur: NEGATIVE mg/dL
Specific Gravity, Urine: 1.009 (ref 1.005–1.030)
Squamous Epithelial / HPF: NONE SEEN (ref 0–5)
pH: 6 (ref 5.0–8.0)

## 2020-06-27 MED ORDER — ONDANSETRON 4 MG PO TBDP
4.0000 mg | ORAL_TABLET | Freq: Once | ORAL | Status: AC | PRN
  Administered 2020-06-27: 4 mg via ORAL
  Filled 2020-06-27: qty 1

## 2020-06-27 MED ORDER — HYDROCODONE-ACETAMINOPHEN 5-325 MG PO TABS: 1.0000 | ORAL_TABLET | Freq: Four times a day (QID) | ORAL | 0 refills | Status: DC | PRN

## 2020-06-27 MED ORDER — OXYCODONE-ACETAMINOPHEN 5-325 MG PO TABS
1.0000 | ORAL_TABLET | ORAL | Status: DC | PRN
  Administered 2020-06-27: 1 via ORAL
  Filled 2020-06-27: qty 1

## 2020-06-27 NOTE — ED Triage Notes (Addendum)
Patient c/o left back/side pain with some radiation down left leg. Pain started last week and saw PCP and given shot to help with pain. Pain has worsened. Recent surgery for kidney stones

## 2020-06-27 NOTE — ED Notes (Signed)
Patient had episode of emesis in flex wait due to pain. Zofran ordered and given along with pain medication

## 2020-06-27 NOTE — Discharge Instructions (Addendum)
Follow-up with your regular doctor if not improving in 3 to 4 days.  Return emergency department worsening.  Take medication as prescribed.

## 2020-06-27 NOTE — ED Provider Notes (Signed)
[facility] Emergency Department Provider Note  ____________________________________________   First MD Initiated Contact with Patient 06/27/20 1709     (approximate)  I have reviewed the triage vital signs and the nursing notes.   HISTORY  Chief Complaint No chief complaint on file.    HPI Jesus Bell is a 64 y.o. male presents emergency department complaining of left-sided back pain.  Patient states that he awoke in pain was severe.  States that he had received a lumbar spine injection due to a bulging disc in the back.  Patient also has history of kidney stones and just recently has had 1 partially removed.  States the doctor told him there a few pieces left and she had to place a stent.  He denies fever or chills.  When he points to the area of pain it is directly in the left flank.    Past Medical History:  Diagnosis Date  . Chicken pox   . Diabetes mellitus type 2, uncomplicated ([place])   . Diabetes mellitus without complication ([place])    boarderline  . Erectile dysfunction   . GERD (gastroesophageal reflux disease)   . History of kidney stones   . Hypercholesterolemia   . Hypertension   . Stroke [facility]) 2012   hemorrhagic vessel    Patient Active Problem List   Diagnosis Date Noted  . Right lower lobe lung mass 06/13/2020  . Diabetes mellitus type 2, uncomplicated ([place]) 17/79/3903  . Hypertension 08/17/2017  . Status post total replacement of hip 08/17/2017  . Primary osteoarthritis of left hip 05/03/2017    Past Surgical History:  Procedure Laterality Date  . CYSTOSCOPY W/ RETROGRADES  05/21/2020   Procedure: CYSTOSCOPY WITH RETROGRADE PYELOGRAM;  Surgeon: Hollice Espy, MD;  Location: [facility] ORS;  Service: Urology;;  . Consuela Mimes WITH BIOPSY N/A 06/04/2020   Procedure: CYSTOSCOPY WITH bladder neck BIOPSY;  Surgeon: Hollice Espy, MD;  Location: [facility] ORS;  Service: Urology;  Laterality: N/A;  . CYSTOSCOPY/URETEROSCOPY/HOLMIUM  LASER/STENT PLACEMENT Left 05/21/2020   Procedure: CYSTOSCOPY/URETEROSCOPY/STENT PLACEMENT;  Surgeon: Hollice Espy, MD;  Location: [facility] ORS;  Service: Urology;  Laterality: Left;  . CYSTOSCOPY/URETEROSCOPY/HOLMIUM LASER/STENT PLACEMENT Left 06/04/2020   Procedure: CYSTOSCOPY/URETEROSCOPY/HOLMIUM LASER/STENT Exchange;  Surgeon: Hollice Espy, MD;  Location: [facility] ORS;  Service: Urology;  Laterality: Left;  . SHOULDER SURGERY Bilateral    clavicle  . TOTAL HIP ARTHROPLASTY Left 08/17/2017   Procedure: TOTAL HIP ARTHROPLASTY;  Surgeon: Dereck Leep, MD;  Location: [facility] ORS;  Service: Orthopedics;  Laterality: Left;  . WRIST FUSION Left     Prior to Admission medications   Medication Sig Start Date End Date Taking? Authorizing Provider  acetaminophen (TYLENOL) 325 MG tablet Take 650 mg by mouth every 4 (four) hours as needed for mild pain or headache.     [provider]  amLODipine (NORVASC) 10 MG tablet Take 10 mg by mouth daily with breakfast.     [provider]  Cholecalciferol (VITAMIN D3) 125 MCG (5000 UT) TABS Take 5,000 Units by mouth daily.    [provider]  clobetasol ointment (TEMOVATE) 0.09 % Apply 1 application topically 2 (two) times daily as needed (rash).  07/01/19 06/30/20  [provider]  HYDROcodone-acetaminophen (NORCO/VICODIN) 5-325 MG tablet Take 1 tablet by mouth every 6 (six) hours as needed for moderate pain. 06/27/20   Lavonn Maxcy, Linden Dolin, PA-C  lovastatin (MEVACOR) 40 MG tablet Take 40 mg by mouth 3 (three) times daily with meals.  [provider]  metFORMIN (GLUCOPHAGE) 1000 MG tablet Take 1,000 mg by mouth 2 (two) times daily after a meal.     [provider]  omeprazole (PRILOSEC) 40 MG capsule Take 40 mg by mouth daily.     [provider]  oxybutynin (DITROPAN) 5 MG tablet Take 1 tablet (5 mg total) by mouth every 8 (eight) hours as needed for bladder spasms. Patient not taking: Reported on 06/21/2020  06/04/20   Hollice Espy, MD  sildenafil (VIAGRA) 100 MG tablet Take 100 mg by mouth as needed for erectile dysfunction.  02/25/17   [provider]  tamsulosin (FLOMAX) 0.4 MG CAPS capsule Take 1 capsule (0.4 mg total) by mouth daily. Patient not taking: Reported on 06/21/2020 05/21/20   Hollice Espy, MD    Allergies Nsaids and Aspirin  Family History  Family history unknown: Yes    Social History Social History   Tobacco Use  . Smoking status: Never Smoker  . Smokeless tobacco: Never Used  Vaping Use  . Vaping Use: Never used  Substance Use Topics  . Alcohol use: No  . Drug use: No    Review of Systems  Constitutional: No fever/chills Eyes: No visual changes. ENT: No sore throat. Respiratory: Denies cough Cardiovascular: Denies chest pain Gastrointestinal: Positive for flank pain Genitourinary: Negative for dysuria. Musculoskeletal: Positive for back pain. Skin: Negative for rash. Psychiatric: no mood changes,     ____________________________________________   PHYSICAL EXAM:  VITAL SIGNS: ED Triage Vitals  Enc Vitals Group     BP 06/27/20 1432 (!) 159/83     Pulse Rate 06/27/20 1432 84     Resp 06/27/20 1432 16     Temp 06/27/20 1432 98.8 F (37.1 C)     Temp Source 06/27/20 1432 Oral     SpO2 06/27/20 1432 97 %     Weight 06/27/20 1522 251 lb (113.9 kg)     Height 06/27/20 1522 5\' 8"  (1.727 m)     Head Circumference --      Peak Flow --      Pain Score 06/27/20 1522 10     Pain Loc --      Pain Edu? --      Excl. in [place]? --     Constitutional: Alert and oriented. Well appearing and in no acute distress. Eyes: Conjunctivae are normal.  Head: Atraumatic. Nose: No congestion/rhinnorhea. Mouth/Throat: Mucous membranes are moist.   Neck:  supple no lymphadenopathy noted Cardiovascular: Normal rate, regular rhythm. Heart sounds are normal Respiratory: Normal respiratory effort.  No retractions, lungs c t a  Abd: soft nontender bs normal  all 4 quad, no CVA tenderness GU: deferred Musculoskeletal: FROM all extremities, warm and well perfused Neurologic:  Normal speech and language.  Skin:  Skin is warm, dry and intact. No rash noted. Psychiatric: Mood and affect are normal. Speech and behavior are normal.  ____________________________________________   LABS (all labs ordered are listed, but only abnormal results are displayed)  Labs Reviewed  URINALYSIS, COMPLETE (UACMP) WITH MICROSCOPIC - Abnormal; Notable for the following components:      Result Value   Color, Urine STRAW (*)    APPearance CLEAR (*)    Hgb urine dipstick SMALL (*)    Leukocytes,Ua TRACE (*)    All other components within normal limits   ____________________________________________   ____________________________________________  RADIOLOGY  CT renal study and no charge lumbar spine  ____________________________________________   PROCEDURES  Procedure(s) performed: No  Procedures  ____________________________________________   INITIAL IMPRESSION / ASSESSMENT AND PLAN / ED COURSE  Pertinent labs & imaging results that were available during my care of the patient were reviewed by me and considered in my medical decision making (see chart for details).   The patient 64 year old male presents emergency department with left-sided flank pain.  See HPI.  Physical exam shows patient to appear stable.  Patient had been given Zofran and pain medication while in triage due to the amount of pain.  States this is helped quite a bit.  DDx: Kidney stone, infected kidney stone, flank pain, back pain  UA and CT renal stone study, no charge lumbar spine  UA is normal  CT renal stone study shows renal stones along the left side.  No infection.  Abscess.  Explained Findings to the patient.  He is to follow-up with his regular urologist.  Pain medication as prescribed.  Return if worsening.  Discharged stable condition.     Jesus Bell  was evaluated in Emergency Department on 06/27/2020 for the symptoms described in the history of present illness. He was evaluated in the context of the global COVID-19 pandemic, which necessitated consideration that the patient might be at risk for infection with the SARS-CoV-2 virus that causes COVID-19. Institutional protocols and algorithms that pertain to the evaluation of patients at risk for COVID-19 are in a state of rapid change based on information released by regulatory bodies including the CDC and federal and state organizations. These policies and algorithms were followed during the patient's care in the ED.    As part of my medical decision making, I reviewed the following data within the [place] notes reviewed and incorporated, Labs reviewed , Old chart reviewed, Radiograph reviewed , Notes from prior ED visits and [place] Controlled Substance Database  ____________________________________________   FINAL CLINICAL IMPRESSION(S) / ED DIAGNOSES  Final diagnoses:  Back pain at L4-L5 level  Kidney stones      NEW MEDICATIONS STARTED DURING THIS VISIT:  New Prescriptions   HYDROCODONE-ACETAMINOPHEN (NORCO/VICODIN) 5-325 MG TABLET    Take 1 tablet by mouth every 6 (six) hours as needed for moderate pain.     Note:  This document was prepared using Dragon voice recognition software and may include unintentional dictation errors.    Versie Starks, PA-C 06/27/20 Charna Busman, MD 06/28/20 2141

## 2020-06-29 ENCOUNTER — Other Ambulatory Visit: Payer: Self-pay

## 2020-06-29 ENCOUNTER — Other Ambulatory Visit: Payer: Medicare Other

## 2020-06-29 DIAGNOSIS — N135 Crossing vessel and stricture of ureter without hydronephrosis: Secondary | ICD-10-CM

## 2020-06-29 DIAGNOSIS — N201 Calculus of ureter: Secondary | ICD-10-CM

## 2020-06-29 LAB — MICROSCOPIC EXAMINATION: RBC, Urine: >30 /hpf — AB (ref 0–2)

## 2020-06-29 LAB — URINALYSIS, COMPLETE
Bilirubin, UA: NEGATIVE
Glucose, UA: NEGATIVE
Ketones, UA: NEGATIVE
Nitrite, UA: NEGATIVE
Specific Gravity, UA: 1.025 (ref 1.005–1.030)
Urobilinogen, Ur: 0.2 mg/dL (ref 0.2–1.0)
pH, UA: 5.5 (ref 5.0–7.5)

## 2020-07-02 ENCOUNTER — Other Ambulatory Visit: Payer: Self-pay

## 2020-07-02 ENCOUNTER — Encounter
Admission: RE | Admit: 2020-07-02 | Discharge: 2020-07-02 | Disposition: A | Discharge status: Home / Self Care | Payer: Medicare Other | Source: Ambulatory Visit | Attending: Urology | Admitting: Urology

## 2020-07-02 NOTE — Patient Instructions (Signed)
Your procedure is scheduled on: 07/09/20 Report to [place]. To find out your arrival time please call [phone] between 1PM - 3PM on 07/06/20.  Remember: Instructions that are not followed completely may result in serious medical risk, up to and including death, or upon the discretion of your surgeon and anesthesiologist your surgery may need to be rescheduled.     _X__ 1. Do not eat food after midnight the night before your procedure.                 No gum chewing or hard candies. You may drink clear liquids up to 2 hours                 before you are scheduled to arrive for your surgery- DO not drink clear                 liquids within 2 hours of the start of your surgery.                 Clear Liquids include:  water, apple juice without pulp, clear carbohydrate                 drink such as Clearfast or Gatorade, Black Coffee or Tea (Do not add                 anything to coffee or tea). Diabetics water only  __X__2.  On the morning of surgery brush your teeth with toothpaste and water, you                 may rinse your mouth with mouthwash if you wish.  Do not swallow any              toothpaste of mouthwash.     _X__ 3.  No Alcohol for 24 hours before or after surgery.   _X__ 4.  Do Not Smoke or use e-cigarettes For 24 Hours Prior to Your Surgery.                 Do not use any chewable tobacco products for at least 6 hours prior to                 surgery.  ____  5.  Bring all medications with you on the day of surgery if instructed.   __X__  6.  Notify your doctor if there is any change in your medical condition      (cold, fever, infections).     Do not wear jewelry, make-up, hairpins, clips or nail polish. Do not wear lotions, powders, or perfumes.  Do not shave 48 hours prior to surgery. Men may shave face and neck. Do not bring valuables to the hospital.    [facility] is not responsible for any belongings  or valuables.  Contacts, dentures/partials or body piercings may not be worn into surgery. Bring a case for your contacts, glasses or hearing aids, a denture cup will be supplied. Leave your suitcase in the car. After surgery it may be brought to your room. For patients admitted to the hospital, discharge time is determined by your treatment team.   Patients discharged the day of surgery will not be allowed to drive home.   Please read over the following fact sheets that you were given:   MRSA Information  __X__ Take these medicines the morning of surgery with A SIP OF WATER:  1. amLODipine (NORVASC) 10 MG tablet  2. lovastatin (MEVACOR) 40 MG tablet  3. omeprazole (PRILOSEC) 40 MG capsule  4. oxybutynin (DITROPAN) 5 MG tablet  (taking this pre surgery can help discomfort after surgery)  5. tamsulosin (FLOMAX) 0.4 MG CAPS capsule (same as above)  6. HYDROcodone-acetaminophen (NORCO/VICODIN) 5-325 MG tablet if needed  ____ Fleet Enema (as directed)   __X__ Use CHG Soap/SAGE wipes as directed  ____ Use inhalers on the day of surgery  __X__ Stop metformin/Janumet/Farxiga 2 days prior to surgery    ____ Take 1/2 of usual insulin dose the night before surgery. No insulin the morning          of surgery.   ____ Stop Blood Thinners Coumadin/Plavix/Xarelto/Pleta/Pradaxa/Eliquis/Effient/Aspirin  on   Or contact your Surgeon, Cardiologist or Medical Doctor regarding  ability to stop your blood thinners  __X__ Stop Anti-inflammatories 7 days before surgery such as Advil, Ibuprofen, Motrin,  BC or Goodies Powder, Naprosyn, Naproxen, Aleve, Aspirin    __X__ Stop all herbal supplements, fish oil or vitamin E until after surgery.    ____ Bring C-Pap to the hospital.

## 2020-07-03 LAB — CULTURE, URINE COMPREHENSIVE

## 2020-07-04 ENCOUNTER — Telehealth: Payer: Self-pay | Admitting: Radiology

## 2020-07-04 DIAGNOSIS — N201 Calculus of ureter: Secondary | ICD-10-CM

## 2020-07-04 MED ORDER — NITROFURANTOIN MONOHYD MACRO 100 MG PO CAPS: 100.0000 mg | ORAL_CAPSULE | Freq: Two times a day (BID) | ORAL | 0 refills | Status: DC

## 2020-07-04 NOTE — Telephone Encounter (Signed)
-----   Message from Hollice Espy, MD sent at 07/04/2020 10:17 AM EDT ----- Low colony count of Enterococcus.  Please treat him with nitrofurantoin 100 mg twice daily starting 5 days before the procedure for total of 7 days.  Hollice Espy, MD

## 2020-07-04 NOTE — Telephone Encounter (Signed)
LMOM that script was sent to pharmacy. 

## 2020-07-05 ENCOUNTER — Other Ambulatory Visit
Admission: RE | Admit: 2020-07-05 | Discharge: 2020-07-05 | Disposition: A | Discharge status: Home / Self Care | Payer: Medicare Other | Source: Ambulatory Visit | Attending: Urology | Admitting: Urology

## 2020-07-05 DIAGNOSIS — Z20822 Contact with and (suspected) exposure to covid-19: Secondary | ICD-10-CM | POA: Diagnosis not present

## 2020-07-05 DIAGNOSIS — Z01812 Encounter for preprocedural laboratory examination: Secondary | ICD-10-CM | POA: Insufficient documentation

## 2020-07-05 LAB — POTASSIUM: Potassium: 3.4 mmol/L — ABNORMAL LOW (ref 3.5–5.1)

## 2020-07-05 LAB — SARS CORONAVIRUS 2 (TAT 6-24 HRS): SARS Coronavirus 2: NEGATIVE

## 2020-07-09 ENCOUNTER — Encounter
Admission: RE | Disposition: A | Discharge status: Home / Self Care | Payer: Self-pay | Source: Home / Self Care | Attending: Urology

## 2020-07-09 ENCOUNTER — Ambulatory Visit: Payer: Medicare Other | Admitting: Anesthesiology

## 2020-07-09 ENCOUNTER — Ambulatory Visit
Admission: RE | Admit: 2020-07-09 | Discharge: 2020-07-09 | Disposition: A | Discharge status: Home / Self Care | Payer: Medicare Other | Attending: Urology | Admitting: Urology

## 2020-07-09 ENCOUNTER — Ambulatory Visit: Payer: Medicare Other

## 2020-07-09 ENCOUNTER — Encounter: Payer: Self-pay | Admitting: Urology

## 2020-07-09 ENCOUNTER — Other Ambulatory Visit: Payer: Self-pay

## 2020-07-09 DIAGNOSIS — I10 Essential (primary) hypertension: Secondary | ICD-10-CM | POA: Diagnosis not present

## 2020-07-09 DIAGNOSIS — Z886 Allergy status to analgesic agent status: Secondary | ICD-10-CM | POA: Diagnosis not present

## 2020-07-09 DIAGNOSIS — N132 Hydronephrosis with renal and ureteral calculous obstruction: Secondary | ICD-10-CM | POA: Insufficient documentation

## 2020-07-09 DIAGNOSIS — Z79899 Other long term (current) drug therapy: Secondary | ICD-10-CM | POA: Insufficient documentation

## 2020-07-09 DIAGNOSIS — N201 Calculus of ureter: Secondary | ICD-10-CM

## 2020-07-09 DIAGNOSIS — Z8673 Personal history of transient ischemic attack (TIA), and cerebral infarction without residual deficits: Secondary | ICD-10-CM | POA: Insufficient documentation

## 2020-07-09 DIAGNOSIS — E119 Type 2 diabetes mellitus without complications: Secondary | ICD-10-CM | POA: Diagnosis not present

## 2020-07-09 DIAGNOSIS — B952 Enterococcus as the cause of diseases classified elsewhere: Secondary | ICD-10-CM | POA: Diagnosis not present

## 2020-07-09 DIAGNOSIS — N135 Crossing vessel and stricture of ureter without hydronephrosis: Secondary | ICD-10-CM

## 2020-07-09 DIAGNOSIS — Z87442 Personal history of urinary calculi: Secondary | ICD-10-CM | POA: Insufficient documentation

## 2020-07-09 DIAGNOSIS — Z7984 Long term (current) use of oral hypoglycemic drugs: Secondary | ICD-10-CM | POA: Insufficient documentation

## 2020-07-09 DIAGNOSIS — E7800 Pure hypercholesterolemia, unspecified: Secondary | ICD-10-CM | POA: Diagnosis not present

## 2020-07-09 DIAGNOSIS — K219 Gastro-esophageal reflux disease without esophagitis: Secondary | ICD-10-CM | POA: Diagnosis not present

## 2020-07-09 HISTORY — PX: CYSTOSCOPY/RETROGRADE/URETEROSCOPY: SHX5316

## 2020-07-09 HISTORY — PX: CYSTOSCOPY W/ URETERAL STENT PLACEMENT: SHX1429

## 2020-07-09 LAB — GLUCOSE, CAPILLARY
Glucose-Capillary: 141 mg/dL — ABNORMAL HIGH (ref 70–99)
Glucose-Capillary: 149 mg/dL — ABNORMAL HIGH (ref 70–99)

## 2020-07-09 SURGERY — CYSTOSCOPY/RETROGRADE/URETEROSCOPY
Anesthesia: General | Site: Ureter | Laterality: Left

## 2020-07-09 MED ORDER — FENTANYL CITRATE (PF) 100 MCG/2ML IJ SOLN: 25.0000 ug | INTRAMUSCULAR | Status: DC | PRN

## 2020-07-09 MED ORDER — FENTANYL CITRATE (PF) 100 MCG/2ML IJ SOLN
INTRAMUSCULAR | Status: AC
  Filled 2020-07-09: qty 2

## 2020-07-09 MED ORDER — ONDANSETRON HCL 4 MG/2ML IJ SOLN
INTRAMUSCULAR | Status: AC
  Filled 2020-07-09: qty 2

## 2020-07-09 MED ORDER — DEXAMETHASONE SODIUM PHOSPHATE 10 MG/ML IJ SOLN
INTRAMUSCULAR | Status: DC | PRN
  Administered 2020-07-09: 10 mg via INTRAVENOUS

## 2020-07-09 MED ORDER — MIDAZOLAM HCL 2 MG/2ML IJ SOLN
INTRAMUSCULAR | Status: DC | PRN
  Administered 2020-07-09: 2 mg via INTRAVENOUS

## 2020-07-09 MED ORDER — CHLORHEXIDINE GLUCONATE 0.12 % MT SOLN
OROMUCOSAL | Status: AC
  Administered 2020-07-09: 15 mL via OROMUCOSAL
  Filled 2020-07-09: qty 15

## 2020-07-09 MED ORDER — CEFAZOLIN SODIUM-DEXTROSE 2-4 GM/100ML-% IV SOLN
2.0000 g | INTRAVENOUS | Status: AC
  Administered 2020-07-09: 2 g via INTRAVENOUS

## 2020-07-09 MED ORDER — SUGAMMADEX SODIUM 500 MG/5ML IV SOLN
INTRAVENOUS | Status: DC | PRN
  Administered 2020-07-09: 400 mg via INTRAVENOUS

## 2020-07-09 MED ORDER — DEXAMETHASONE SODIUM PHOSPHATE 10 MG/ML IJ SOLN
INTRAMUSCULAR | Status: AC
  Filled 2020-07-09: qty 1

## 2020-07-09 MED ORDER — KETOROLAC TROMETHAMINE 30 MG/ML IJ SOLN
INTRAMUSCULAR | Status: DC | PRN
  Administered 2020-07-09: 30 mg via INTRAVENOUS

## 2020-07-09 MED ORDER — ROCURONIUM BROMIDE 10 MG/ML (PF) SYRINGE
PREFILLED_SYRINGE | INTRAVENOUS | Status: AC
  Filled 2020-07-09: qty 10

## 2020-07-09 MED ORDER — HYDROCODONE-ACETAMINOPHEN 5-325 MG PO TABS
1.0000 | ORAL_TABLET | Freq: Four times a day (QID) | ORAL | 0 refills | Status: DC | PRN
Start: 2020-07-09 — End: 2020-10-23

## 2020-07-09 MED ORDER — LIDOCAINE HCL (CARDIAC) PF 100 MG/5ML IV SOSY
PREFILLED_SYRINGE | INTRAVENOUS | Status: DC | PRN
  Administered 2020-07-09: 100 mg via INTRAVENOUS

## 2020-07-09 MED ORDER — ACETAMINOPHEN 10 MG/ML IV SOLN
INTRAVENOUS | Status: AC
  Filled 2020-07-09: qty 100

## 2020-07-09 MED ORDER — ROCURONIUM BROMIDE 100 MG/10ML IV SOLN
INTRAVENOUS | Status: DC | PRN
  Administered 2020-07-09: 50 mg via INTRAVENOUS

## 2020-07-09 MED ORDER — CHLORHEXIDINE GLUCONATE 0.12 % MT SOLN: 15.0000 mL | Freq: Once | OROMUCOSAL | Status: AC

## 2020-07-09 MED ORDER — ORAL CARE MOUTH RINSE: 15.0000 mL | Freq: Once | OROMUCOSAL | Status: AC

## 2020-07-09 MED ORDER — IOHEXOL 180 MG/ML  SOLN
INTRAMUSCULAR | Status: DC | PRN
  Administered 2020-07-09: 20 mL

## 2020-07-09 MED ORDER — FENTANYL CITRATE (PF) 100 MCG/2ML IJ SOLN
INTRAMUSCULAR | Status: DC | PRN
Start: 2020-07-09 — End: 2020-07-09
  Administered 2020-07-09: 50 ug via INTRAVENOUS

## 2020-07-09 MED ORDER — MIDAZOLAM HCL 2 MG/2ML IJ SOLN
INTRAMUSCULAR | Status: AC
  Filled 2020-07-09: qty 2

## 2020-07-09 MED ORDER — SODIUM CHLORIDE 0.9 % IV SOLN: INTRAVENOUS | Status: DC

## 2020-07-09 MED ORDER — ACETAMINOPHEN 10 MG/ML IV SOLN
INTRAVENOUS | Status: DC | PRN
  Administered 2020-07-09: 1000 mg via INTRAVENOUS

## 2020-07-09 MED ORDER — ONDANSETRON HCL 4 MG/2ML IJ SOLN
INTRAMUSCULAR | Status: DC | PRN
  Administered 2020-07-09: 4 mg via INTRAVENOUS

## 2020-07-09 MED ORDER — PROPOFOL 10 MG/ML IV BOLUS
INTRAVENOUS | Status: AC
  Filled 2020-07-09: qty 20

## 2020-07-09 MED ORDER — ONDANSETRON HCL 4 MG/2ML IJ SOLN: 4.0000 mg | Freq: Once | INTRAMUSCULAR | Status: DC | PRN

## 2020-07-09 MED ORDER — CEFAZOLIN SODIUM-DEXTROSE 2-4 GM/100ML-% IV SOLN
INTRAVENOUS | Status: AC
  Filled 2020-07-09: qty 100

## 2020-07-09 MED ORDER — PROPOFOL 10 MG/ML IV BOLUS
INTRAVENOUS | Status: DC | PRN
  Administered 2020-07-09: 180 mg via INTRAVENOUS

## 2020-07-09 MED ORDER — LIDOCAINE HCL (PF) 2 % IJ SOLN
INTRAMUSCULAR | Status: AC
  Filled 2020-07-09: qty 5

## 2020-07-09 SURGICAL SUPPLY — 30 items
BAG DRAIN CYSTO-URO LG1000N (MISCELLANEOUS) ×4 IMPLANT
BASKET ZERO TIP 1.9FR (BASKET) ×3 IMPLANT
BRUSH SCRUB EZ 1% IODOPHOR (MISCELLANEOUS) ×4 IMPLANT
BSKT STON RTRVL ZERO TP 1.9FR (BASKET) ×2
CATH URETL 5X70 OPEN END (CATHETERS) ×4 IMPLANT
CNTNR SPEC 2.5X3XGRAD LEK (MISCELLANEOUS)
CONT SPEC 4OZ STER OR WHT (MISCELLANEOUS)
CONT SPEC 4OZ STRL OR WHT (MISCELLANEOUS)
CONTAINER SPEC 2.5X3XGRAD LEK (MISCELLANEOUS) IMPLANT
DRAPE UTILITY 15X26 TOWEL STRL (DRAPES) ×4 IMPLANT
FIBER LASER TRACTIP 200 (UROLOGICAL SUPPLIES) ×1 IMPLANT
GLOVE BIO SURGEON STRL SZ 6.5 (GLOVE) ×3 IMPLANT
GLOVE BIO SURGEONS STRL SZ 6.5 (GLOVE) ×1
GOWN STRL REUS W/ TWL LRG LVL3 (GOWN DISPOSABLE) ×4 IMPLANT
GOWN STRL REUS W/TWL LRG LVL3 (GOWN DISPOSABLE) ×8
GUIDEWIRE GREEN .038 145CM (MISCELLANEOUS) IMPLANT
GUIDEWIRE STR DUAL SENSOR (WIRE) ×7 IMPLANT
INFUSOR MANOMETER BAG 3000ML (MISCELLANEOUS) ×4 IMPLANT
INTRODUCER DILATOR DOUBLE (INTRODUCER) IMPLANT
KIT TURNOVER CYSTO (KITS) ×4 IMPLANT
PACK CYSTO AR (MISCELLANEOUS) ×4 IMPLANT
SET CYSTO W/LG BORE CLAMP LF (SET/KITS/TRAYS/PACK) ×4 IMPLANT
SHEATH URETERAL 12FRX35CM (MISCELLANEOUS) IMPLANT
SOL .9 NS 3000ML IRR  AL (IV SOLUTION) ×4
SOL .9 NS 3000ML IRR AL (IV SOLUTION) ×2
SOL .9 NS 3000ML IRR UROMATIC (IV SOLUTION) ×2 IMPLANT
STENT URET 6FRX24 CONTOUR (STENTS) IMPLANT
STENT URET 6FRX26 CONTOUR (STENTS) ×3 IMPLANT
SURGILUBE 2OZ TUBE FLIPTOP (MISCELLANEOUS) ×4 IMPLANT
WATER STERILE IRR 1000ML POUR (IV SOLUTION) ×4 IMPLANT

## 2020-07-09 NOTE — Anesthesia Procedure Notes (Signed)
Procedure Name: Intubation Date/Time: 07/09/2020 9:17 AM Performed by: Johnna Acosta, CRNA Pre-anesthesia Checklist: Patient identified, Patient being monitored, Timeout performed, Emergency Drugs available and Suction available Patient Re-evaluated:Patient Re-evaluated prior to induction Oxygen Delivery Method: Circle system utilized Preoxygenation: Pre-oxygenation with 100% oxygen Induction Type: IV induction Ventilation: Mask ventilation without difficulty Laryngoscope Size: Mac and 3 Grade View: Grade I Tube type: Oral Tube size: 7.5 mm Number of attempts: 1 Airway Equipment and Method: Stylet Placement Confirmation: ETT inserted through vocal cords under direct vision,  positive ETCO2 and breath sounds checked- equal and bilateral Secured at: 21 cm Tube secured with: Tape Dental Injury: Teeth and Oropharynx as per pre-operative assessment

## 2020-07-09 NOTE — Anesthesia Preprocedure Evaluation (Signed)
Anesthesia Evaluation  Patient identified by MRN, date of birth, ID band Patient awake    Reviewed: Allergy & Precautions, NPO status , Patient's Chart, lab work & pertinent test results  History of Anesthesia Complications Negative for: history of anesthetic complications  Airway Mallampati: II       Dental   Pulmonary neg sleep apnea, neg COPD, Not current smoker,           Cardiovascular hypertension, Pt. on medications (-) Past MI and (-) CHF (-) dysrhythmias (-) Valvular Problems/Murmurs     Neuro/Psych neg Seizures CVA (L arm weakness), No Residual Symptoms    GI/Hepatic Neg liver ROS, GERD  Medicated and Controlled,  Endo/Other  diabetes, Type 2, Oral Hypoglycemic Agents  Renal/GU negative Renal ROS     Musculoskeletal   Abdominal   Peds  Hematology   Anesthesia Other Findings   Reproductive/Obstetrics                             Anesthesia Physical Anesthesia Plan  ASA: III  Anesthesia Plan: General   Post-op Pain Management:    Induction: Intravenous  PONV Risk Score and Plan: 2  Airway Management Planned: Oral ETT and LMA  Additional Equipment:   Intra-op Plan:   Post-operative Plan:   Informed Consent: I have reviewed the patients History and Physical, chart, labs and discussed the procedure including the risks, benefits and alternatives for the proposed anesthesia with the patient or authorized representative who has indicated his/her understanding and acceptance.       Plan Discussed with:   Anesthesia Plan Comments:         Anesthesia Quick Evaluation

## 2020-07-09 NOTE — H&P (Addendum)
07/09/20 8:56 AM   Jesus Bell 04-01-1956 [id number]   HPI: 64 year old male with impacted left mid ureteral calculus who returns today for staged procedure.  Please see previous notes for details.  In the interim, he has presented to the emergency room x1 with stent pain but otherwise no infection.  Preoperative urine culture with Enterococcus, currently on macrobid.   PMH: Past Medical History:  Diagnosis Date  . Chicken pox   . Diabetes mellitus type 2, uncomplicated ([place])   . Diabetes mellitus without complication ([place])    boarderline  . Erectile dysfunction   . GERD (gastroesophageal reflux disease)   . History of kidney stones   . Hypercholesterolemia   . Hypertension   . Stroke [facility]) 2012   hemorrhagic vessel    Surgical History: Past Surgical History:  Procedure Laterality Date  . CYSTOSCOPY W/ RETROGRADES  05/21/2020   Procedure: CYSTOSCOPY WITH RETROGRADE PYELOGRAM;  Surgeon: Hollice Espy, MD;  Location: [facility] ORS;  Service: Urology;;  . Consuela Mimes WITH BIOPSY N/A 06/04/2020   Procedure: CYSTOSCOPY WITH bladder neck BIOPSY;  Surgeon: Hollice Espy, MD;  Location: [facility] ORS;  Service: Urology;  Laterality: N/A;  . CYSTOSCOPY/URETEROSCOPY/HOLMIUM LASER/STENT PLACEMENT Left 05/21/2020   Procedure: CYSTOSCOPY/URETEROSCOPY/STENT PLACEMENT;  Surgeon: Hollice Espy, MD;  Location: [facility] ORS;  Service: Urology;  Laterality: Left;  . CYSTOSCOPY/URETEROSCOPY/HOLMIUM LASER/STENT PLACEMENT Left 06/04/2020   Procedure: CYSTOSCOPY/URETEROSCOPY/HOLMIUM LASER/STENT Exchange;  Surgeon: Hollice Espy, MD;  Location: [facility] ORS;  Service: Urology;  Laterality: Left;  . SHOULDER SURGERY Bilateral    clavicle  . TOTAL HIP ARTHROPLASTY Left 08/17/2017   Procedure: TOTAL HIP ARTHROPLASTY;  Surgeon: Dereck Leep, MD;  Location: [facility] ORS;  Service: Orthopedics;  Laterality: Left;  . WRIST FUSION Left     Home Medications:  Current Meds  Medication Sig  . acetaminophen  (TYLENOL) 325 MG tablet Take 650 mg by mouth every 4 (four) hours as needed for mild pain or headache.   Marland Kitchen amLODipine (NORVASC) 10 MG tablet Take 10 mg by mouth daily with breakfast.   . Cholecalciferol (VITAMIN D3) 125 MCG (5000 UT) TABS Take 5,000 Units by mouth daily.  . [EXPIRED] clobetasol ointment (TEMOVATE) 0.73 % Apply 1 application topically 2 (two) times daily as needed (rash).   Marland Kitchen HYDROcodone-acetaminophen (NORCO/VICODIN) 5-325 MG tablet Take 1 tablet by mouth every 6 (six) hours as needed for moderate pain.  Marland Kitchen lovastatin (MEVACOR) 40 MG tablet Take 40 mg by mouth daily.   . metFORMIN (GLUCOPHAGE) 1000 MG tablet Take 1,000 mg by mouth 2 (two) times daily after a meal.   . nitrofurantoin, macrocrystal-monohydrate, (MACROBID) 100 MG capsule Take 1 capsule (100 mg total) by mouth every 12 (twelve) hours.  Marland Kitchen omeprazole (PRILOSEC) 40 MG capsule Take 40 mg by mouth daily.   . sildenafil (VIAGRA) 100 MG tablet Take 100 mg by mouth as needed for erectile dysfunction.       Allergies:  Allergies  Allergen Reactions  . Nsaids Other (See Comments)    History of hemorrhagic stroke. No nsaids or aspirin   . Aspirin Other (See Comments)    States cannot take due to h/o hemorrhagic stroke     Family History: Family History  Adopted: Yes  Family history unknown: Yes    Social History:  reports that he has never smoked. He has never used smokeless tobacco. He reports that he does not drink alcohol and does not use drugs.   Physical Exam: BP (!) 145/98   Pulse 84  Temp 97.8 F (36.6 C) (Temporal)   Resp 16   SpO2 100%   Constitutional:  Alert and oriented, No acute distress. HEENT: [place] AT, moist mucus membranes.  Trachea midline, no masses. Cardiovascular: No clubbing, cyanosis, or edema.  RRR. Respiratory: Normal respiratory effort, no increased work of breathing.  CTAB. Skin: No rashes, bruises or suspicious lesions. Neurologic: Grossly intact, no focal deficits, moving all 4  extremities. Psychiatric: Normal mood and affect.  Laboratory Data: Lab Results  Component Value Date   WBC 6.2 06/13/2020   HGB 12.9 (L) 06/13/2020   HCT 36.8 (L) 06/13/2020   MCV 70.1 (L) 06/13/2020   PLT 355 06/13/2020    Lab Results  Component Value Date   CREATININE 1.03 06/13/2020    Lab Results  Component Value Date   HGBA1C 7.4 (H) 08/06/2017    Urinalysis    Component Value Date/Time   COLORURINE STRAW (A) 06/27/2020 1817   APPEARANCEUR Cloudy (A) 06/29/2020 1000   LABSPEC 1.009 06/27/2020 1817   LABSPEC 1.024 09/22/2011 1952   PHURINE 6.0 06/27/2020 1817   GLUCOSEU Negative 06/29/2020 1000   GLUCOSEU Negative 09/22/2011 1952   HGBUR SMALL (A) 06/27/2020 1817   BILIRUBINUR Negative 06/29/2020 1000   BILIRUBINUR Negative 09/22/2011 1952   KETONESUR NEGATIVE 06/27/2020 1817   PROTEINUR 2+ (A) 06/29/2020 1000   PROTEINUR NEGATIVE 06/27/2020 1817   UROBILINOGEN 1.0 08/07/2007 1622   NITRITE Negative 06/29/2020 1000   NITRITE NEGATIVE 06/27/2020 1817   LEUKOCYTESUR 1+ (A) 06/29/2020 1000   LEUKOCYTESUR TRACE (A) 06/27/2020 1817   LEUKOCYTESUR Negative 09/22/2011 1952    Lab Results  Component Value Date   LABMICR See below: 06/29/2020   WBCUA 11-30 (A) 06/29/2020   LABEPIT 0-10 06/29/2020   BACTERIA Few 06/29/2020    Pertinent Imaging: Results for orders placed during the hospital encounter of 05/04/20  CT HEMATURIA WORKUP  Addendum 05/11/2020  7:18 AM ADDENDUM REPORT: 05/11/2020 07:15  ADDENDUM: Request is made to compare pulmonary nodule to appearance on prior CT dated 08/07/2007. At that time, a ground-glass nodular opacity was present in this location, and was favored to reflect a questionable nodular density versus mucous plugging. However, the current appearance is mildly progressive in size and now predominantly solid in appearance. This change is the basis for the original recommendation of dedicated imaging evaluation, either CT chest  or PET-CT, to exclude the possibility of an indolent adenocarcinoma.   Electronically Signed By: Julian Hy M.D. On: 05/11/2020 07:15  Narrative CLINICAL DATA:  Left hydronephrosis  EXAM: CT ABDOMEN AND PELVIS WITHOUT AND WITH CONTRAST  TECHNIQUE: Multidetector CT imaging of the abdomen and pelvis was performed following the standard protocol before and following the bolus administration of intravenous contrast.  CONTRAST:  144mL OMNIPAQUE IOHEXOL 300 MG/ML  SOLN  COMPARISON:  08/07/2007  FINDINGS: Lower chest: 2.0 x 1.8 cm predominantly solid nodule in the right lower lobe (series 3/image 7), poorly evaluated but suspicious for primary bronchogenic neoplasm.  Hepatobiliary: Moderate geographic hepatic steatosis. No suspicious/enhancing hepatic lesions.  Gallbladder is unremarkable. No intrahepatic or extrahepatic ductal dilatation.  Pancreas: Within normal limits.  Spleen: Within normal limits.  Adrenals/Urinary Tract: Adrenal glands are within normal limits.  Small bilateral renal cysts, measuring up to 13 mm in the medial right upper kidney (series 9/image 32).  Two punctate nonobstructing right upper pole renal calculi (series 2/image 36). 3 mm nonobstructing left lower pole renal calculus (series 2/image 43). Moderate left hydroureteronephrosis with associated 6 mm proximal left  ureteral calculus at the L4-5 level (series 5/image 96).  On delayed imaging, there are no filling defects in the bilateral proximal collecting systems, ureters, or bladder.  Bladder is within normal limits.  Stomach/Bowel: Stomach is within normal limits.  No evidence of bowel obstruction.  Normal appendix (series 9/image 52).  Scattered mild colonic diverticulosis, without evidence of diverticulitis.  Vascular/Lymphatic: No evidence of abdominal aortic aneurysm.  No suspicious abdominopelvic lymphadenopathy.  Reproductive: Prostate is grossly  unremarkable.  Other: No abdominopelvic ascites.  Musculoskeletal: Degenerative changes of the visualized thoracolumbar spine. Left hip arthroplasty, without evidence of complication.  IMPRESSION: 2.0 cm predominantly solid nodule in the right lower lobe, poorly evaluated but suspicious for primary bronchogenic neoplasm. Consider dedicated CT chest or PET-CT for further evaluation.  6 mm proximal left ureteral calculus at the L4-5 level. Associated moderate left hydroureteronephrosis.  Additional bilateral nonobstructing renal calculi measuring up to 3 mm.  These results will be called to the ordering clinician or representative by the Radiologist Assistant, and communication documented in the PACS or [employer].  Electronically Signed: By: Julian Hy M.D. On: 05/04/2020 16:10  Results for orders placed during the hospital encounter of 06/27/20  CT Renal Stone Study  Narrative CLINICAL DATA:  Flank pain  EXAM: CT ABDOMEN AND PELVIS WITHOUT CONTRAST  TECHNIQUE: Multidetector CT imaging of the abdomen and pelvis was performed following the standard protocol without IV contrast.  COMPARISON:  MRI 06/15/2020, PET CT 06/12/2020, CT 05/04/2020  FINDINGS: Lower chest: Lung bases demonstrate no acute consolidation or effusion. 1.5 cm right lower lobe pulmonary nodule. Borderline cardiomegaly.  Hepatobiliary: Hepatic steatosis with fat sparing near the gallbladder fossa. No calcified stone or biliary dilatation.  Pancreas: No inflammatory change. Vague hypodensity at the pancreatic head, corresponding to cystic lesion on recent MRI.  Spleen: Normal in size without focal abnormality.  Adrenals/Urinary Tract: Adrenal glands are normal. Punctate stone mid pole right kidney. Left ureteral stent with proximal pigtail at the upper pole collecting system and distal pigtail in the right aspect of the bladder. Moderate left hydronephrosis and  proximal hydroureter. Punctate stone within the mid left ureter adjacent to the stent, coronal series 5, image number 35. Urinary bladder unremarkable.  Stomach/Bowel: Stomach is within normal limits. Appendix appears normal. No evidence of bowel wall thickening, distention, or inflammatory changes.  Vascular/Lymphatic: Nonaneurysmal aorta. 15 mm lymph node adjacent to the duodenum, series 2, image number 33. Mild aortic atherosclerosis.  Reproductive: Prostate is unremarkable.  Other: Negative for free air or free fluid.  Musculoskeletal: Left hip replacement. No acute or suspicious osseous lesion.  IMPRESSION: 1. Left ureteral stent in place. Moderate left hydronephrosis and proximal hydroureter. Punctate stone within the mid left ureter adjacent to the stent. 2. 1.5 cm right lower lobe pulmonary nodule, see previous PET CT 06/12/2020. 3. Hepatic steatosis. 4. Vague hypodensity at the pancreatic head, corresponding to cystic lesion on recent MRI. See MRI report 06/15/2020. 5. 15 mm lymph node adjacent to the duodenum, nonspecific. 6. Aortic atherosclerosis.  Aortic Atherosclerosis (ICD10-I70.0).   Electronically Signed By: Donavan Foil M.D. On: 06/27/2020 18:42   Assessment & Plan:    1.  Left ureteral calculus Small amount of residual stone on PET scan.  Discussed options including return to the operating room for staged procedure to treat his remaining stone burden.  He is agreeable this plan.  Risks and benefits discussed in detail occluding risk infection, bleeding, damage surrounding structures, ureteral strictures amongst others discussed.  All questions answered.  Hollice Espy, MD  [facility] [address] [place], [place] [address] [phone]

## 2020-07-09 NOTE — Anesthesia Postprocedure Evaluation (Signed)
Anesthesia Post Note  Patient: Jesus Bell  Procedure(s) Performed: CYSTOSCOPY/RETROGRADE/URETEROSCOPY (Left Ureter) CYSTOSCOPY WITH STENT REPLACEMENT (Left Ureter)  Patient location during evaluation: PACU Anesthesia Type: General Level of consciousness: awake and alert Pain management: pain level controlled Vital Signs Assessment: post-procedure vital signs reviewed and stable Respiratory status: spontaneous breathing and respiratory function stable Cardiovascular status: stable Anesthetic complications: no   No complications documented.   Last Vitals:  Vitals:   07/09/20 1029 07/09/20 1043  BP: 121/82 (!) 110/58  Pulse: 87   Resp: 14 16  Temp: (!) 36.4 C (!) 36.4 C  SpO2: 98% 98%    Last Pain:  Vitals:   07/09/20 1043  TempSrc: Temporal  PainSc: 0-No pain                 Amery Vandenbos K

## 2020-07-09 NOTE — Discharge Instructions (Signed)
Tips for tolerating your ureteral stent after your urologic procedure Your urologist placed a stent into your ureter (the tube that connects your kidney to your bladder). This ureteral stent functions much like a tiny straw that has as small coil in the kidney and in the bladder to help hold it in place and to keep your ureter open until the healing process is complete. If the stent is removed prior to the resolution of swelling, you may experience significant discomfort. This pain is very similar to the pain experienced with a kidney or ureteral stone. Therefore, we urge you to leave the stent in for the prescribed amount of time.  To improve the tolerability of the stent, several medications are available.   This is a medication that helps relieve bladder cramps and spasms often experienced with a ureteral stent.    Frequently Asked Questions about Ureteral Stents: Q Is it normal to have discomfort present? A Yes. In fact, certain movements may increase discomfort. Examples include: frequent bending and increased activity. Q Is it normal to feel like I have to urinate more often? A Yes,  Q Is it normal to feel pain or pressure before and/or during urination? A Yes, some patients have significant pain in the back during urination because the stent allows urine to flow back toward the kidney. Others may experience pain in the bladder because the stent may cause a bladder cramp during or at the end of urination. Q Is it normal to have blood in my urine and what if it is not present all the time? A Yes. The stent will cause some irritation to tissue, it may be present while you have the stent and after you remove the stent. The blood may be a light red or darker at times. Don't be alarmed if the blood resolves and then returns. This is typical for most patients    AMBULATORY SURGERY  DISCHARGE INSTRUCTIONS   1) The drugs that you were given will stay in your system until tomorrow so  for the next 24 hours you should not:  A) Drive an automobile B) Make any legal decisions C) Drink any alcoholic beverage   2) You may resume regular meals tomorrow.  Today it is better to start with liquids and gradually work up to solid foods.  You may eat anything you prefer, but it is better to start with liquids, then soup and crackers, and gradually work up to solid foods.   3) Please notify your doctor immediately if you have any unusual bleeding, trouble breathing, redness and pain at the surgery site, drainage, fever, or pain not relieved by medication.    4) Additional Instructions:        Please contact your physician with any problems or Same Day Surgery at [phone], Monday through Friday 6 am to 4 pm, or [place] at [facility] number at [phone].

## 2020-07-09 NOTE — Transfer of Care (Signed)
Immediate Anesthesia Transfer of Care Note  Patient: Jesus Bell  Procedure(s) Performed: CYSTOSCOPY/RETROGRADE/URETEROSCOPY (Left Ureter) CYSTOSCOPY WITH STENT REPLACEMENT (Left Ureter)  Patient Location: PACU  Anesthesia Type:General  Level of Consciousness: awake and drowsy  Airway & Oxygen Therapy: Patient Spontanous Breathing and Patient connected to face mask oxygen  Post-op Assessment: Report given to RN and Post -op Vital signs reviewed and stable  Post vital signs: Reviewed and stable  Last Vitals:  Vitals Value Taken Time  BP 130/91 07/09/20 1000  Temp 36.2 C 07/09/20 0959  Pulse 91 07/09/20 1000  Resp 22 07/09/20 1000  SpO2 98 % 07/09/20 1000    Last Pain:  Vitals:   07/09/20 0959  TempSrc:   PainSc: Asleep         Complications: No complications documented.

## 2020-07-09 NOTE — Op Note (Signed)
Date of procedure: 07/09/20  Preoperative diagnosis:  1. Impacted left proximal/mid ureteral calculus 2. Left hydroureteronephrosis  Postoperative diagnosis:  1. Same as above  Procedure: 1. Left ureteroscopy 2. Basket extraction of stone fragment 3. Left retrograde pyelogram 4. Left ureteral stent exchange 5. Interpretation of fluoroscopy less than 30 minutes  Surgeon: Hollice Espy, MD  Anesthesia: General  Complications: None  Intraoperative findings: 3 stone fragments identified within distal ureter, presumably dislodged with stent removal, largest measuring approximately 5 mm, able to be extracted via basket.  Previous stone location the mid proximal ureter with mild edema and a small flap of tissue.  Persistent hydroureteronephrosis.  EBL: Minimal  Specimens: None  Drains: 6 x 26 French double-J ureteral stent on right, tether left in place  Indication: Jesus Bell is a 64 y.o. patient with severely impacted left proximal/mid ureteral calculus who returns today for staged procedure given the persistent stone fragments.  Please see previous notes for details..  After reviewing the management options for treatment, he elected to proceed with the above surgical procedure(s). We have discussed the potential benefits and risks of the procedure, side effects of the proposed treatment, the likelihood of the patient achieving the goals of the procedure, and any potential problems that might occur during the procedure or recuperation. Informed consent has been obtained.  Description of procedure:  The patient was taken to the operating room and general anesthesia was induced.  The patient was placed in the dorsal lithotomy position, prepped and draped in the usual sterile fashion, and preoperative antibiotics were administered. A preoperative time-out was performed.   A 21 French scope was advanced per urethra into the bladder.  Attention was turned to the left ureteral orifice  from which a ureteral stent was seen emanating.  The distal coil the stent was grasped and brought to level the urethral meatus.  This was then cannulated using a sensor wire up to level of the kidney.  This was snapped in place as a safety wire.  A 4.5 semirigid ureteroscope was then brought in and introduced into the distal ureter.  Immediately, 3 stone fragments were identified.  2 were relatively small and one was more significant size, approximately 5 mm.  A 1.9 French tipless nitinol basket was then used to extract each of these.  They did not require fragmentation in order to remove.  And then advanced the scope up to the mid proximal ureter where the stone had been previously lodged.  There is a small flap of tissue necessitating using a second wire in order to advance the scope beyond this.  Once I was beyond this, an area of scarlike mucosa was encountered but it was more widely patent then on previous occasions.  I very carefully backed the scope to where there is some mild edema and mucosal flap.  I advanced and retracted my scope across this area multiple times which became easier with each pass.  I was able to visually confirm that there is no residual stone at this location, but appeared to be the site where the stone event previously lodged.  I suspect that when his stent was removed, the stone toppled down into the distal ureter.  I injected some contrast at this level which confirmed ureteral patency although some mild narrowing.  No contrast extravasation was seen.  There is still persistent hydroureteronephrosis down to this level consistent with the chronicity of the obstruction.  Finally, the scope was removed.  A 6 x 26 double-J  ureteral stent was advanced after being backloaded into a rigid cystoscope.  This went up easily unlike on previous occasions.  The wire was then partially withdrawn until full coils noted within the renal pelvis.  The wire was then fully withdrawn and full coils noted  within the bladder.  The bladder was then drained.  The tether was left attached to the distal coil the stent which was brought to the level of the urethral meatus and attached to the patient's glans using Mastisol and Tegaderm.  The patient was then cleaned and dried, repositioned supine position, reversed myesthesia, taken to the PACU in stable condition.  Plan: He would like to come into the office next Monday to have his stent removed by our clinical staff.  We will have him follow-up in 4 to 6 weeks with a renal ultrasound prior.  Both he and his wife are aware that he is a very high risk of ureteral stricture given the severe impaction which is chronic.  Suspect he would likely have persistent hydroureteronephrosis and may require Lasix renogram to help differentiate whether he is ongoing obstruction.  Hollice Espy, M.D.

## 2020-07-10 ENCOUNTER — Encounter: Payer: Self-pay | Admitting: Urology

## 2020-07-12 ENCOUNTER — Other Ambulatory Visit: Payer: Self-pay | Admitting: Radiology

## 2020-07-12 DIAGNOSIS — N201 Calculus of ureter: Secondary | ICD-10-CM

## 2020-07-16 ENCOUNTER — Ambulatory Visit: Payer: Medicare Other

## 2020-07-16 ENCOUNTER — Other Ambulatory Visit: Payer: Self-pay

## 2020-07-16 DIAGNOSIS — N201 Calculus of ureter: Secondary | ICD-10-CM

## 2020-07-16 NOTE — Progress Notes (Signed)
Patient presents to office for removal of ureteral stent. Tether was easily visualized as it was taped to the glans. Tape was removed, tether was removed in one downward swift motion. Patient tolerated well. Patient given verbal instruction on when to call office for signs of infection, otherwise follow up as scheduled. Patient given copy of upcoming appointment.

## 2020-08-10 ENCOUNTER — Other Ambulatory Visit: Payer: Self-pay

## 2020-08-10 ENCOUNTER — Ambulatory Visit
Admission: RE | Admit: 2020-08-10 | Discharge: 2020-08-10 | Disposition: A | Discharge status: Home / Self Care | Payer: Medicare Other | Source: Ambulatory Visit | Attending: Urology | Admitting: Urology

## 2020-08-10 DIAGNOSIS — N201 Calculus of ureter: Secondary | ICD-10-CM | POA: Insufficient documentation

## 2020-08-14 ENCOUNTER — Ambulatory Visit: Payer: Medicare Other | Admitting: Urology

## 2020-08-15 ENCOUNTER — Other Ambulatory Visit: Payer: Medicare Other

## 2020-09-01 DIAGNOSIS — N400 Enlarged prostate without lower urinary tract symptoms: Secondary | ICD-10-CM | POA: Insufficient documentation

## 2020-09-01 DIAGNOSIS — K219 Gastro-esophageal reflux disease without esophagitis: Secondary | ICD-10-CM | POA: Insufficient documentation

## 2020-09-01 DIAGNOSIS — E7800 Pure hypercholesterolemia, unspecified: Secondary | ICD-10-CM | POA: Insufficient documentation

## 2020-09-01 DIAGNOSIS — N529 Male erectile dysfunction, unspecified: Secondary | ICD-10-CM | POA: Insufficient documentation

## 2020-09-01 DIAGNOSIS — K269 Duodenal ulcer, unspecified as acute or chronic, without hemorrhage or perforation: Secondary | ICD-10-CM | POA: Insufficient documentation

## 2020-09-01 DIAGNOSIS — E291 Testicular hypofunction: Secondary | ICD-10-CM | POA: Insufficient documentation

## 2020-09-06 ENCOUNTER — Telehealth: Payer: Self-pay | Admitting: *Deleted

## 2020-09-06 DIAGNOSIS — N1330 Unspecified hydronephrosis: Secondary | ICD-10-CM

## 2020-09-06 NOTE — Telephone Encounter (Addendum)
Patient informed, voiced understanding. RUS order placed, follow up scheduled, aware to keep appointment and contact office with any further questions.    ----- Message from Hollice Espy, MD sent at 09/05/2020 12:53 PM EST ----- This patient no showed for his follow-up visit and has some residual hydronephrosis on the left which needs to be discussed/further evaluated.  Please reschedule him and let us plan to repeat renal ultrasound prior to this appointment in January.  Hollice Espy, MD

## 2020-09-24 ENCOUNTER — Other Ambulatory Visit: Payer: Self-pay

## 2020-09-24 ENCOUNTER — Ambulatory Visit
Admission: RE | Admit: 2020-09-24 | Discharge: 2020-09-24 | Disposition: A | Discharge status: Home / Self Care | Payer: Medicare Other | Source: Ambulatory Visit | Attending: Urology | Admitting: Urology

## 2020-09-24 DIAGNOSIS — N202 Calculus of kidney with calculus of ureter: Secondary | ICD-10-CM | POA: Diagnosis not present

## 2020-09-24 DIAGNOSIS — N1330 Unspecified hydronephrosis: Secondary | ICD-10-CM | POA: Diagnosis not present

## 2020-09-25 DIAGNOSIS — E7800 Pure hypercholesterolemia, unspecified: Secondary | ICD-10-CM | POA: Diagnosis not present

## 2020-09-25 DIAGNOSIS — K219 Gastro-esophageal reflux disease without esophagitis: Secondary | ICD-10-CM | POA: Diagnosis not present

## 2020-09-25 DIAGNOSIS — I10 Essential (primary) hypertension: Secondary | ICD-10-CM | POA: Diagnosis not present

## 2020-09-25 DIAGNOSIS — E1169 Type 2 diabetes mellitus with other specified complication: Secondary | ICD-10-CM | POA: Diagnosis not present

## 2020-09-25 DIAGNOSIS — M169 Osteoarthritis of hip, unspecified: Secondary | ICD-10-CM | POA: Diagnosis not present

## 2020-10-10 ENCOUNTER — Ambulatory Visit: Payer: Self-pay | Admitting: Urology

## 2020-10-10 ENCOUNTER — Encounter: Payer: Self-pay | Admitting: Urology

## 2020-10-26 ENCOUNTER — Inpatient Hospital Stay: Admission: RE | Admit: 2020-10-26 | Payer: Medicare Other | Source: Ambulatory Visit

## 2020-10-30 ENCOUNTER — Other Ambulatory Visit: Payer: Self-pay

## 2020-10-30 ENCOUNTER — Encounter
Admission: RE | Admit: 2020-10-30 | Discharge: 2020-10-30 | Disposition: A | Discharge status: Home / Self Care | Payer: Medicare Other | Source: Ambulatory Visit | Attending: Orthopedic Surgery | Admitting: Orthopedic Surgery

## 2020-10-30 DIAGNOSIS — Z01818 Encounter for other preprocedural examination: Secondary | ICD-10-CM | POA: Diagnosis not present

## 2020-10-30 LAB — COMPREHENSIVE METABOLIC PANEL
ALT: 15 U/L (ref 0–44)
AST: 19 U/L (ref 15–41)
Albumin: 4.1 g/dL (ref 3.5–5.0)
Alkaline Phosphatase: 107 U/L (ref 38–126)
Anion gap: 10 (ref 5–15)
BUN: 10 mg/dL (ref 8–23)
CO2: 27 mmol/L (ref 22–32)
Calcium: 9.8 mg/dL (ref 8.9–10.3)
Chloride: 103 mmol/L (ref 98–111)
Creatinine, Ser: 0.91 mg/dL (ref 0.61–1.24)
GFR, Estimated: >60 mL/min (ref >60)
Glucose, Bld: 124 mg/dL — ABNORMAL HIGH (ref 70–99)
Potassium: 3.6 mmol/L (ref 3.5–5.1)
Sodium: 140 mmol/L (ref 135–145)
Total Bilirubin: 1 mg/dL (ref 0.3–1.2)
Total Protein: 7.6 g/dL (ref 6.5–8.1)

## 2020-10-30 LAB — CBC WITH DIFFERENTIAL/PLATELET
Abs Immature Granulocytes: 0.02 10*3/uL (ref 0.00–0.07)
Basophils Absolute: 0 10*3/uL (ref 0.0–0.1)
Basophils Relative: 0 %
Eosinophils Absolute: 0.1 10*3/uL (ref 0.0–0.5)
Eosinophils Relative: 3 %
HCT: 37.4 % — ABNORMAL LOW (ref 39.0–52.0)
Hemoglobin: 12.7 g/dL — ABNORMAL LOW (ref 13.0–17.0)
Immature Granulocytes: 0 %
Lymphocytes Relative: 21 %
Lymphs Abs: 1.1 10*3/uL (ref 0.7–4.0)
MCH: 24 pg — ABNORMAL LOW (ref 26.0–34.0)
MCHC: 34 g/dL (ref 30.0–36.0)
MCV: 70.6 fL — ABNORMAL LOW (ref 80.0–100.0)
Monocytes Absolute: 0.7 10*3/uL (ref 0.1–1.0)
Monocytes Relative: 14 %
Neutro Abs: 3.3 10*3/uL (ref 1.7–7.7)
Neutrophils Relative %: 62 %
Platelets: 354 10*3/uL (ref 150–400)
RBC: 5.3 MIL/uL (ref 4.22–5.81)
RDW: 16.7 % — ABNORMAL HIGH (ref 11.5–15.5)
WBC: 5.3 10*3/uL (ref 4.0–10.5)
nRBC: 0 % (ref 0.0–0.2)

## 2020-10-30 LAB — HEMOGLOBIN A1C
Hgb A1c MFr Bld: 7.2 % — ABNORMAL HIGH (ref 4.8–5.6)
Mean Plasma Glucose: 159.94 mg/dL

## 2020-10-30 LAB — SURGICAL PCR SCREEN
MRSA, PCR: NEGATIVE
Staphylococcus aureus: NEGATIVE

## 2020-10-30 LAB — URINALYSIS, ROUTINE W REFLEX MICROSCOPIC
Bilirubin Urine: NEGATIVE
Glucose, UA: NEGATIVE mg/dL
Hgb urine dipstick: NEGATIVE
Ketones, ur: NEGATIVE mg/dL
Leukocytes,Ua: NEGATIVE
Nitrite: NEGATIVE
Protein, ur: NEGATIVE mg/dL
Specific Gravity, Urine: 1.016 (ref 1.005–1.030)
pH: 6 (ref 5.0–8.0)

## 2020-10-30 LAB — PROTIME-INR
INR: 1.1 (ref 0.8–1.2)
Prothrombin Time: 13.5 seconds (ref 11.4–15.2)

## 2020-10-30 LAB — TYPE AND SCREEN
ABO/RH(D): A POS
Antibody Screen: NEGATIVE

## 2020-10-30 LAB — C-REACTIVE PROTEIN: CRP: 2.4 mg/dL — ABNORMAL HIGH (ref <1.0)

## 2020-10-30 LAB — SEDIMENTATION RATE: Sed Rate: 28 mm/hr — ABNORMAL HIGH (ref 0–20)

## 2020-10-30 LAB — APTT: aPTT: 34 seconds (ref 24–36)

## 2020-10-30 NOTE — Patient Instructions (Addendum)
Your procedure is scheduled on: 11/09/20 Report to [facility]. To find out your arrival time please call [phone] between 1PM - 3PM on 11/08/20.  Remember: Instructions that are not followed completely may result in serious medical risk, up to and including death, or upon the discretion of your surgeon and anesthesiologist your surgery may need to be rescheduled.     _X__ 1. Do not eat food after midnight the night before your procedure.                 No gum chewing or hard candies.                 . Diabetics water only  __X__2.  On the morning of surgery brush your teeth with toothpaste and water, you                 may rinse your mouth with mouthwash if you wish.  Do not swallow any              toothpaste of mouthwash.     _X__ 3.  No Alcohol for 24 hours before or after surgery.   _X__ 4.  Do Not Smoke or use e-cigarettes For 24 Hours Prior to Your Surgery.                 Do not use any chewable tobacco products for at least 6 hours prior to                 surgery.  ____  5.  Bring all medications with you on the day of surgery if instructed.   __X__  6.  Notify your doctor if there is any change in your medical condition      (cold, fever, infections).     Do not wear jewelry, make-up, hairpins, clips or nail polish. Do not wear lotions, powders, or perfumes.  Do not shave 48 hours prior to surgery. Men may shave face and neck. Do not bring valuables to the hospital.    [facility] is not responsible for any belongings or valuables.  Contacts, dentures/partials or body piercings may not be worn into surgery. Bring a case for your contacts, glasses or hearing aids, a denture cup will be supplied. Leave your suitcase in the car. After surgery it may be brought to your room. For patients admitted to the hospital, discharge time is determined by your treatment team.   Patients discharged the day of surgery will not  be allowed to drive home.   Please read over the following fact sheets that you were given:   MRSA Information  __X__ Take these medicines the morning of surgery with A SIP OF WATER:    1. amLODipine (NORVASC) 10 MG tablet  2. lovastatin (MEVACOR) 40 MG tablet  3. omeprazole (PRILOSEC) 40 MG capsule  4. traMADol (ULTRAM) 50 MG tablet if needed  5.  6.  ____ Fleet Enema (as directed)   __X__ Use CHG Soap/SAGE wipes as directed  ____ Use inhalers on the day of surgery  _X___ Stop metformin/Janumet/Farxiga 2 days prior to surgery LAST DOSE TUESDAY   ____ Take 1/2 of usual insulin dose the night before surgery. No insulin the morning          of surgery.   ____ Stop Blood Thinners Coumadin/Plavix/Xarelto/Pleta/Pradaxa/Eliquis/Effient/Aspirin  on   Or contact your Surgeon, Cardiologist or Medical Doctor regarding  ability to stop your blood  thinners  __X__ Stop Anti-inflammatories 7 days before surgery such as Advil, Ibuprofen, Motrin,  BC or Goodies Powder, Naprosyn, Naproxen, Aleve, Aspirin    __X__ Stop all herbal supplements, fish oil or vitamin E until after surgery.    ____ Bring C-Pap to the hospital.

## 2020-10-31 ENCOUNTER — Other Ambulatory Visit: Payer: Medicare Other

## 2020-10-31 ENCOUNTER — Ambulatory Visit (INDEPENDENT_AMBULATORY_CARE_PROVIDER_SITE_OTHER): Payer: Medicare Other | Admitting: Urology

## 2020-10-31 VITALS — BP 149/91 | HR 91 | Ht 68.0 in | Wt 245.0 lb

## 2020-10-31 DIAGNOSIS — N200 Calculus of kidney: Secondary | ICD-10-CM | POA: Diagnosis not present

## 2020-10-31 DIAGNOSIS — N1330 Unspecified hydronephrosis: Secondary | ICD-10-CM

## 2020-10-31 LAB — URINE CULTURE
Culture: NO GROWTH
Special Requests: NORMAL

## 2020-10-31 NOTE — Progress Notes (Signed)
10/31/2020 2:00 PM   Jesus Bell 06-21-1956 [id number]  Referring provider: Wenda Low, MD [place] [employer] [place] [address],  [place] [address]  Chief Complaint  Patient presents with  . Nephrolithiasis    HPI: 65 year old male with chronic left ureteral calculus/ureteral stricture who presents today for follow-up.  He was incidentally identified to have hydronephrosis on MRI.  CT urogram indicated this was secondary to an obstructing stone.  He underwent multiple staged procedures to address the stone burden given the difficulty in degree of impaction.  Ultimately, the stone was able to be removed and he follows up with renal ultrasound.  He had initial renal ultrasound about 4 weeks after stent was removed.  This was on 08/12/2020 which showed some mild left renal pelvic fullness.  The study was repeated on 09/24/2020 which is essentially stable.  Notably, there are bilateral ureteral jets.  Report also indicates that he has a larger contralateral stone but on CT urogram, this was essentially punctate.  He denies any ongoing flank pain, dysuria, or any other symptoms.  He reports that he unfortunately will be undergoing a left hip revision as he continues have chronic pain in his left hip.  Stone composition 100% calcium oxalate monohydrate.   PMH: Past Medical History:  Diagnosis Date  . Chicken pox   . Diabetes mellitus type 2, uncomplicated ([place])   . Diabetes mellitus without complication ([place])    boarderline  . Erectile dysfunction   . GERD (gastroesophageal reflux disease)   . History of kidney stones   . Hypercholesterolemia   . Hypertension   . Stroke [facility]) 2012   hemorrhagic vessel    Surgical History: Past Surgical History:  Procedure Laterality Date  . CYSTOSCOPY W/ RETROGRADES  05/21/2020   Procedure: CYSTOSCOPY WITH RETROGRADE PYELOGRAM;  Surgeon: Hollice Espy, MD;  Location: [facility] ORS;  Service: Urology;;  . Consuela Mimes W/ URETERAL STENT  PLACEMENT Left 07/09/2020   Procedure: CYSTOSCOPY WITH STENT REPLACEMENT;  Surgeon: Hollice Espy, MD;  Location: [facility] ORS;  Service: Urology;  Laterality: Left;  . CYSTOSCOPY WITH BIOPSY N/A 06/04/2020   Procedure: CYSTOSCOPY WITH bladder neck BIOPSY;  Surgeon: Hollice Espy, MD;  Location: [facility] ORS;  Service: Urology;  Laterality: N/A;  . CYSTOSCOPY/RETROGRADE/URETEROSCOPY Left 07/09/2020   Procedure: CYSTOSCOPY/RETROGRADE/URETEROSCOPY;  Surgeon: Hollice Espy, MD;  Location: [facility] ORS;  Service: Urology;  Laterality: Left;  . CYSTOSCOPY/URETEROSCOPY/HOLMIUM LASER/STENT PLACEMENT Left 05/21/2020   Procedure: CYSTOSCOPY/URETEROSCOPY/STENT PLACEMENT;  Surgeon: Hollice Espy, MD;  Location: [facility] ORS;  Service: Urology;  Laterality: Left;  . CYSTOSCOPY/URETEROSCOPY/HOLMIUM LASER/STENT PLACEMENT Left 06/04/2020   Procedure: CYSTOSCOPY/URETEROSCOPY/HOLMIUM LASER/STENT Exchange;  Surgeon: Hollice Espy, MD;  Location: [facility] ORS;  Service: Urology;  Laterality: Left;  . SHOULDER SURGERY Bilateral    clavicle  . TOTAL HIP ARTHROPLASTY Left 08/17/2017   Procedure: TOTAL HIP ARTHROPLASTY;  Surgeon: Dereck Leep, MD;  Location: [facility] ORS;  Service: Orthopedics;  Laterality: Left;  . WRIST FUSION Left     Home Medications:  Allergies as of 10/31/2020      Reactions   Nsaids Other (See Comments)   History of hemorrhagic stroke. No nsaids or aspirin   Aspirin Other (See Comments)   States cannot take due to h/o hemorrhagic stroke      Medication List       Accurate as of October 31, 2020  2:00 PM. If you have any questions, ask your nurse or doctor.        acetaminophen 325 MG tablet Commonly known  as: TYLENOL Take 325-650 mg by mouth every 4 (four) hours as needed for mild pain or headache.   amLODipine 10 MG tablet Commonly known as: NORVASC Take 10 mg by mouth daily with breakfast.   lovastatin 40 MG tablet Commonly known as: MEVACOR Take 40 mg by mouth daily.   metFORMIN 1000  MG tablet Commonly known as: GLUCOPHAGE Take 1,000 mg by mouth 2 (two) times daily after a meal.   omeprazole 40 MG capsule Commonly known as: PRILOSEC Take 40 mg by mouth daily.   sildenafil 100 MG tablet Commonly known as: VIAGRA Take 100 mg by mouth as needed for erectile dysfunction.   traMADol 50 MG tablet Commonly known as: ULTRAM Take 50 mg by mouth daily as needed for pain.   Vitamin D3 125 MCG (5000 UT) Tabs Take 5,000 Units by mouth daily.       Allergies:  Allergies  Allergen Reactions  . Nsaids Other (See Comments)    History of hemorrhagic stroke. No nsaids or aspirin   . Aspirin Other (See Comments)    States cannot take due to h/o hemorrhagic stroke     Family History: Family History  Adopted: Yes  Family history unknown: Yes    Social History:  reports that he has never smoked. He has never used smokeless tobacco. He reports that he does not drink alcohol and does not use drugs.   Physical Exam: BP (!) 149/91   Pulse 91   Ht 5\' 8"  (1.727 m)   Wt 245 lb (111.1 kg)   BMI 37.25 kg/m   Constitutional:  Alert and oriented, No acute distress. HEENT: [place] AT, moist mucus membranes.  Trachea midline, no masses. Cardiovascular: No clubbing, cyanosis, or edema. Respiratory: Normal respiratory effort, no increased work of breathing. Skin: No rashes, bruises or suspicious lesions. Neurologic: Grossly intact, no focal deficits, moving all 4 extremities. Psychiatric: Normal mood and affect.  Laboratory Data: Lab Results  Component Value Date   WBC 5.3 10/30/2020   HGB 12.7 (L) 10/30/2020   HCT 37.4 (L) 10/30/2020   MCV 70.6 (L) 10/30/2020   PLT 354 10/30/2020    Lab Results  Component Value Date   CREATININE 0.91 10/30/2020     Lab Results  Component Value Date   HGBA1C 7.2 (H) 10/30/2020    Pertinent Imaging: Ultrasound renal complete  Narrative CLINICAL DATA:  Follow-up hydronephrosis  EXAM: RENAL / URINARY TRACT ULTRASOUND  COMPLETE  COMPARISON:  Ultrasound kidneys 08/10/2020  FINDINGS: Right Kidney:  Renal measurements: 12.1 x 5.4 x 5.4 cm = volume: 187 mL. 9 mm shadowing calculus. 2 cm left upper pole cyst. Echogenicity within normal limits. No mass or hydronephrosis visualized.  Left Kidney:  Renal measurements: 11.8 x 5.3 x 4.5 cm = volume: 147 mL. Normal echogenicity. Mild fullness of the left renal pelvis similar to the prior study. Left lower pole cyst measuring 10 mm, and 7 mm  Bladder:  Appears normal for degree of bladder distention. Bilateral ureteral jets identified.  Prevoid bladder volume 82 mL.  Postvoid volume 12 mL.  Other:  None.  IMPRESSION: Non obstructing 9 mm right upper pole calculus  Mild fullness left renal pelvis compatible with mild hydronephrosis unchanged from the prior study.   Electronically Signed By: Franchot Gallo M.D. On: 09/24/2020 11:01  Renal ultrasound was personally reviewed.  Agree with radiologic interpretation.  Assessment & Plan:    1. 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  Bilateral ureteral jets identified, normal creatinine and otherwise asymptomatic which is all very reassuring.  We discussed that this likely secondary to chronic dilation of the collecting system as the stone is likely been present for quite some time.  We discussed the role of Lasix renogram to assess for any sort of subtle obstruction as well as the consequences of chronic obstruction.  At this point, would likely serve as a baseline.  Ultimately he declined the study.  I do more strongly suggest a renal ultrasound in 6 months to ensure that this hydronephrosis does not recur/progress.  He is agreeable this plan.  Warning symptoms were also  reviewed. - Ultrasound renal complete; Future  2. Right renal stone Renal ultrasound reviewed and indicates that there is a larger nonobstructing stone on the right.  When reviewing in comparison to his CT scan, these are in fact punctate.  We will follow with renal ultrasound as outlined above.  In addition see above, we reviewed stone diet recommendations, advised to increase fluids, etc.   Follow-up in 6 months with renal ultrasound prior  Hollice Espy, MD  [facility] [address], [place], [place] [address] [phone]  I spent 30 total minutes on the day of the encounter including pre-visit review of the medical record, face-to-face time with the patient, and post visit ordering of labs/imaging/tests.  Multiple renal ultrasounds as well as scans were reviewed anticipation of seeing this patient today.  I also reviewed the labs.

## 2020-11-06 IMAGING — CT CT RENAL STONE PROTOCOL
2 of 4 series · 16 of 46 positions shown, 18 images · non-contrast
Comparison: MRI 06/15/2020, PET CT 06/12/2020, CT 05/04/2020

CLINICAL DATA: Flank pain

EXAM:
CT ABDOMEN AND PELVIS WITHOUT CONTRAST
TECHNIQUE: Multidetector CT imaging of the abdomen and pelvis was performed
following the standard protocol without IV contrast.

[Series 2: stone full standard (person_name) · axial · 0.88mm/px · z∈[-528,-93]mm · 13 of 95 slices shown, 15 images]
[im 4/95  soft-tissue]
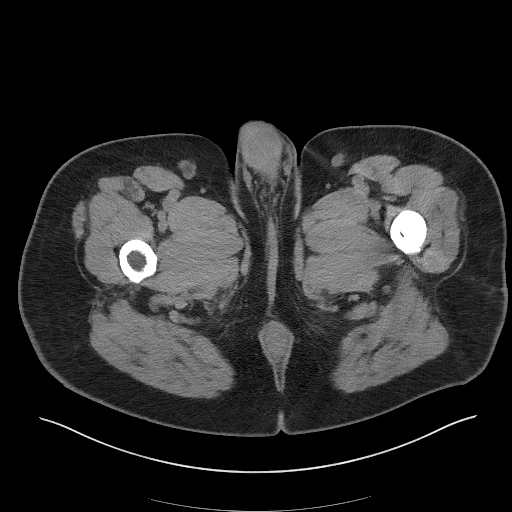
[im 4/95  bone]
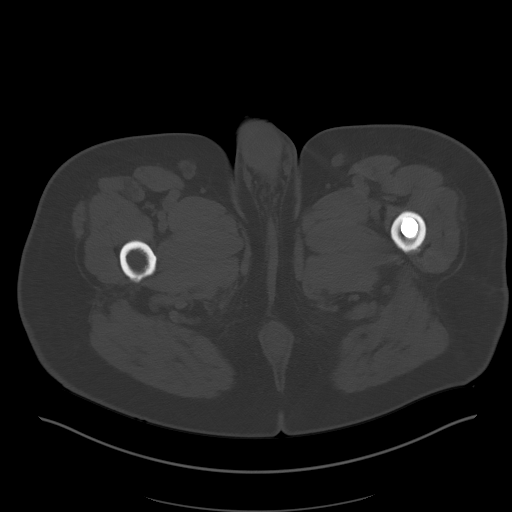
[im 12/95  soft-tissue]
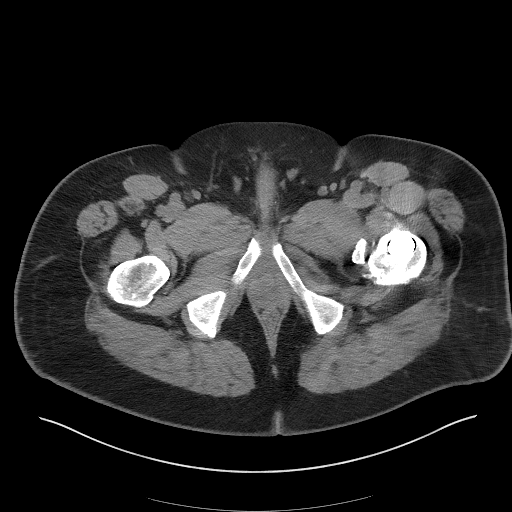
[im 20/95  soft-tissue]
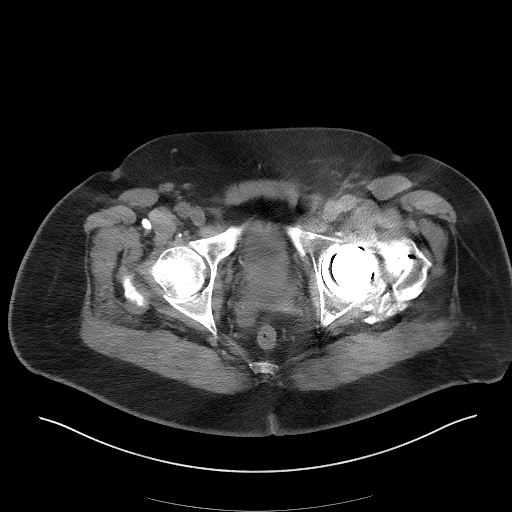
[im 28/95  soft-tissue]
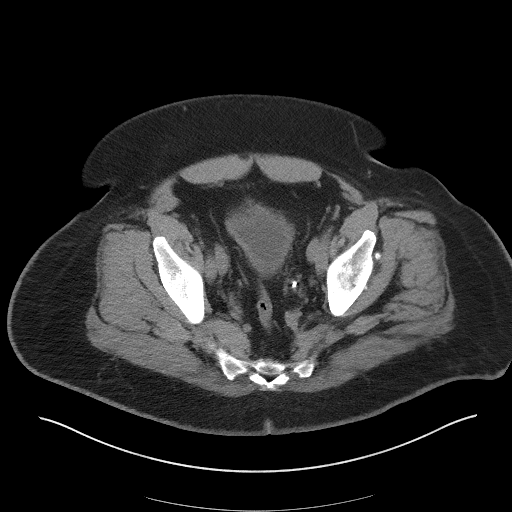
[im 32/95  soft-tissue]
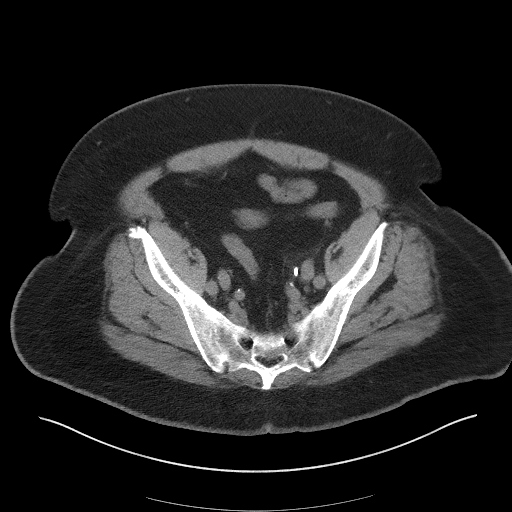
[im 40/95  soft-tissue]
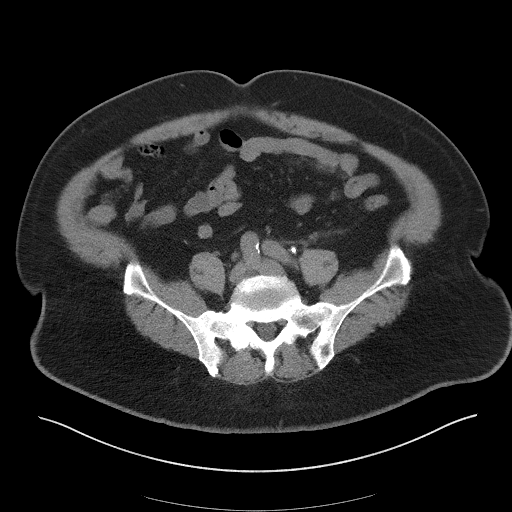
[im 48/95  soft-tissue]
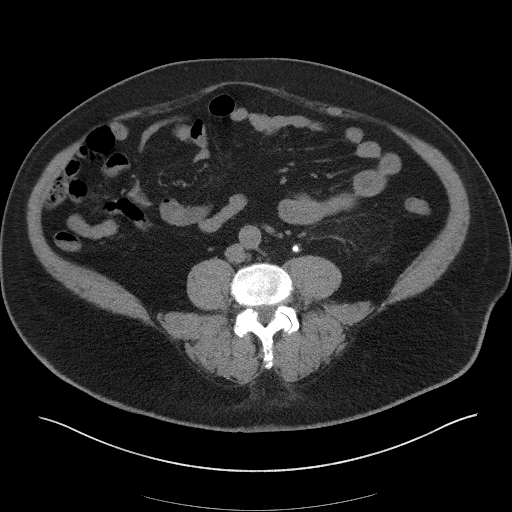
[im 55/95  soft-tissue]
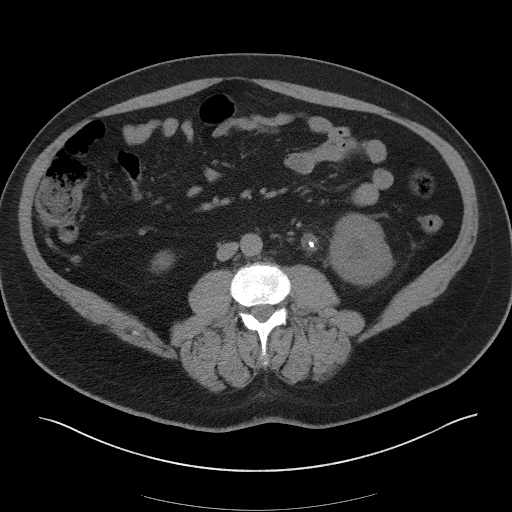
[im 63/95  soft-tissue]
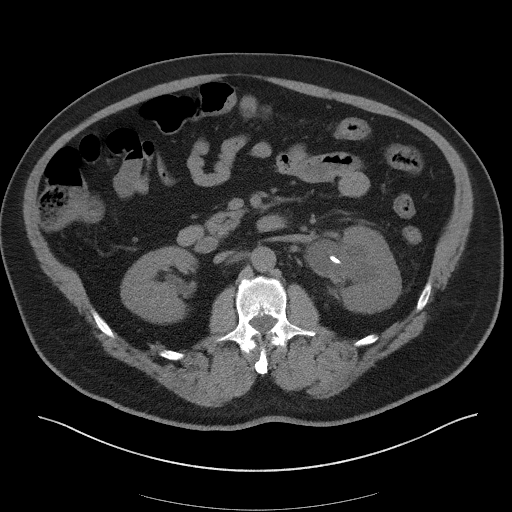
[im 63/95  bone]
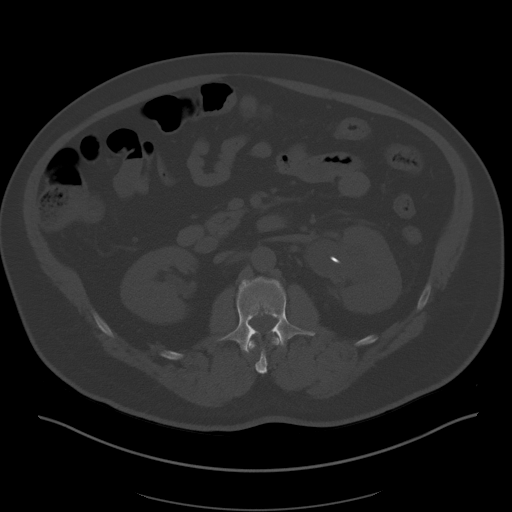
[im 67/95  soft-tissue]
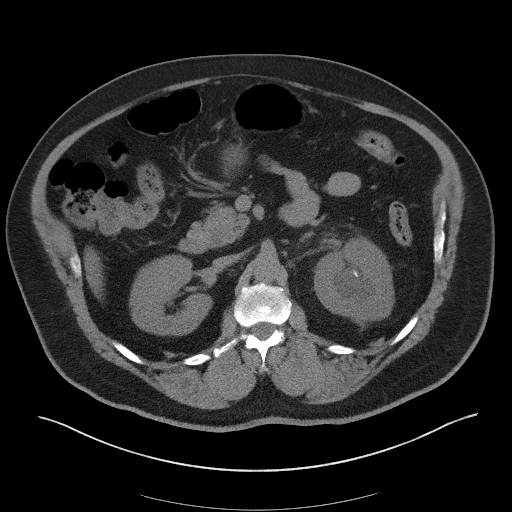
[im 75/95  soft-tissue]
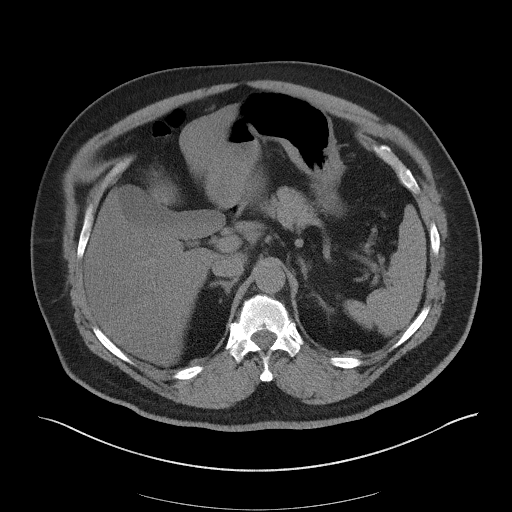
[im 83/95  soft-tissue]
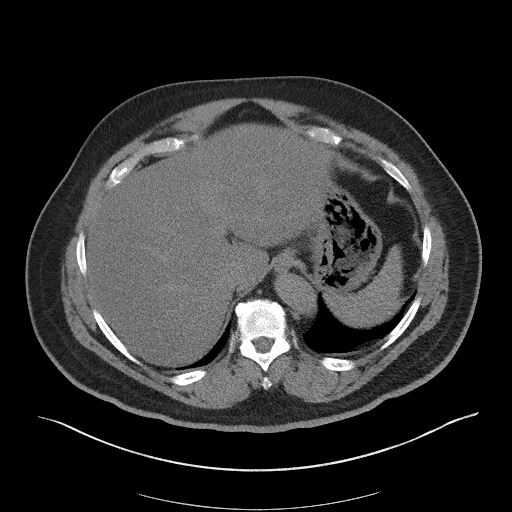
[im 91/95  soft-tissue]
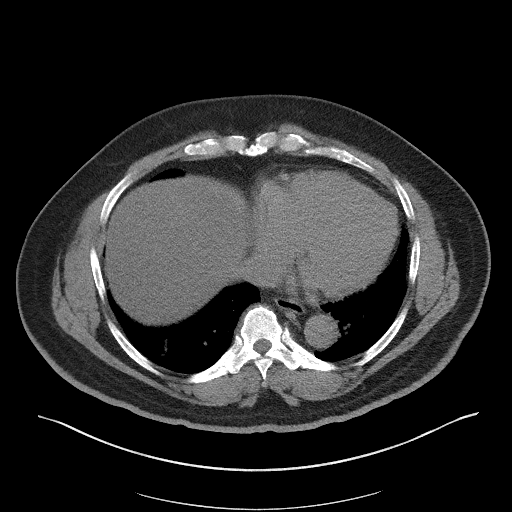

[Series 5: coronal · coronal · 0.94mm/px · 3 of 67 slices shown]
[im 23/67  soft-tissue]
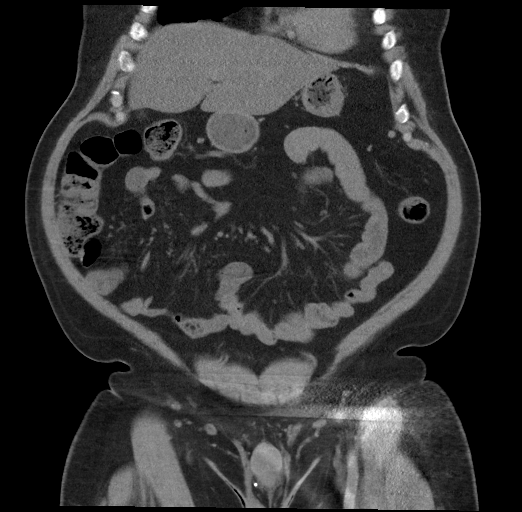
[im 30/67  soft-tissue]
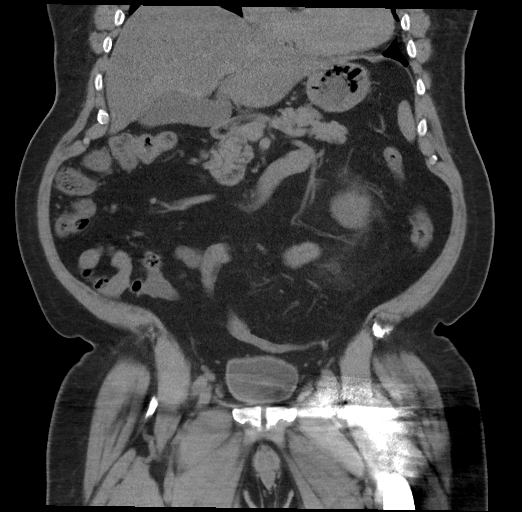
[im 37/67  soft-tissue]
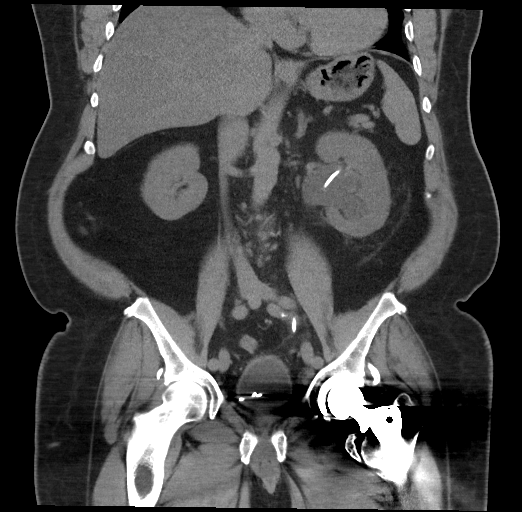

[16 of 46 positions shown; findings below may reference images not displayed]

FINDINGS: Lower chest: Lung bases demonstrate no acute consolidation or
effusion. 1.5 cm right lower lobe pulmonary nodule. Borderline
cardiomegaly.

Hepatobiliary: Hepatic steatosis with fat sparing near the
gallbladder fossa. No calcified stone or biliary dilatation.

Pancreas: No inflammatory change. Vague hypodensity at the
pancreatic head, corresponding to cystic lesion on recent MRI.

Spleen: Normal in size without focal abnormality.

Adrenals/Urinary Tract: Adrenal glands are normal. Punctate stone
mid pole right kidney. Left ureteral stent with proximal pigtail at
the upper pole collecting system and distal pigtail in the right
aspect of the bladder. Moderate left hydronephrosis and proximal
hydroureter. Punctate stone within the mid left ureter adjacent to
the stent, coronal series 5, image number 35. Urinary bladder
unremarkable.

Stomach/Bowel: Stomach is within normal limits. Appendix appears
normal. No evidence of bowel wall thickening, distention, or
inflammatory changes.

Vascular/Lymphatic: Nonaneurysmal aorta. 15 mm lymph node adjacent
to the duodenum, series 2, image number 33. Mild aortic
atherosclerosis.

Reproductive: Prostate is unremarkable.

Other: Negative for free air or free fluid.

Musculoskeletal: Left hip replacement. No acute or suspicious
osseous lesion.
IMPRESSION: 1. Left ureteral stent in place. Moderate left hydronephrosis and
proximal hydroureter. Punctate stone within the mid left ureter
adjacent to the stent.
[DATE] cm right lower lobe pulmonary nodule, see previous PET CT
06/12/2020.
[DATE]. Hepatic steatosis.
4. Vague hypodensity at the pancreatic head, corresponding to cystic
lesion on recent MRI. See MRI report 06/15/2020.
5. 15 mm lymph node adjacent to the duodenum, nonspecific.
6. Aortic atherosclerosis.

Aortic Atherosclerosis ([id number]).

## 2020-11-07 ENCOUNTER — Other Ambulatory Visit
Admission: RE | Admit: 2020-11-07 | Discharge: 2020-11-07 | Disposition: A | Discharge status: Home / Self Care | Payer: Medicare Other | Source: Ambulatory Visit | Attending: Orthopedic Surgery | Admitting: Orthopedic Surgery

## 2020-11-07 ENCOUNTER — Other Ambulatory Visit: Payer: Self-pay

## 2020-11-07 DIAGNOSIS — Z20822 Contact with and (suspected) exposure to covid-19: Secondary | ICD-10-CM | POA: Insufficient documentation

## 2020-11-07 DIAGNOSIS — E041 Nontoxic single thyroid nodule: Secondary | ICD-10-CM | POA: Diagnosis not present

## 2020-11-07 DIAGNOSIS — R000 Tachycardia, unspecified: Secondary | ICD-10-CM | POA: Diagnosis not present

## 2020-11-07 DIAGNOSIS — E876 Hypokalemia: Secondary | ICD-10-CM | POA: Diagnosis not present

## 2020-11-07 DIAGNOSIS — D62 Acute posthemorrhagic anemia: Secondary | ICD-10-CM | POA: Diagnosis not present

## 2020-11-07 DIAGNOSIS — K219 Gastro-esophageal reflux disease without esophagitis: Secondary | ICD-10-CM | POA: Diagnosis not present

## 2020-11-07 DIAGNOSIS — Z01812 Encounter for preprocedural laboratory examination: Secondary | ICD-10-CM | POA: Insufficient documentation

## 2020-11-07 DIAGNOSIS — U071 COVID-19: Secondary | ICD-10-CM | POA: Diagnosis not present

## 2020-11-07 DIAGNOSIS — Z8673 Personal history of transient ischemic attack (TIA), and cerebral infarction without residual deficits: Secondary | ICD-10-CM | POA: Diagnosis not present

## 2020-11-07 DIAGNOSIS — I10 Essential (primary) hypertension: Secondary | ICD-10-CM | POA: Diagnosis not present

## 2020-11-07 DIAGNOSIS — T84031A Mechanical loosening of internal left hip prosthetic joint, initial encounter: Secondary | ICD-10-CM | POA: Diagnosis not present

## 2020-11-07 DIAGNOSIS — Z7984 Long term (current) use of oral hypoglycemic drugs: Secondary | ICD-10-CM | POA: Diagnosis not present

## 2020-11-07 DIAGNOSIS — I493 Ventricular premature depolarization: Secondary | ICD-10-CM | POA: Diagnosis not present

## 2020-11-07 DIAGNOSIS — E1169 Type 2 diabetes mellitus with other specified complication: Secondary | ICD-10-CM | POA: Diagnosis not present

## 2020-11-07 DIAGNOSIS — Z87442 Personal history of urinary calculi: Secondary | ICD-10-CM | POA: Diagnosis not present

## 2020-11-07 LAB — SARS CORONAVIRUS 2 (TAT 6-24 HRS): SARS Coronavirus 2: NEGATIVE

## 2020-11-08 MED ORDER — CEFAZOLIN SODIUM-DEXTROSE 2-4 GM/100ML-% IV SOLN
2.0000 g | INTRAVENOUS | Status: AC
  Administered 2020-11-09 (×3): 2 g via INTRAVENOUS

## 2020-11-08 MED ORDER — TRANEXAMIC ACID-NACL 1000-0.7 MG/100ML-% IV SOLN
1000.0000 mg | INTRAVENOUS | Status: AC
  Administered 2020-11-09: 1000 mg via INTRAVENOUS

## 2020-11-08 MED ORDER — ORAL CARE MOUTH RINSE: 15.0000 mL | Freq: Once | OROMUCOSAL | Status: AC

## 2020-11-08 MED ORDER — GABAPENTIN 300 MG PO CAPS: 300.0000 mg | ORAL_CAPSULE | Freq: Once | ORAL | Status: AC

## 2020-11-08 MED ORDER — CHLORHEXIDINE GLUCONATE 0.12 % MT SOLN: 15.0000 mL | Freq: Once | OROMUCOSAL | Status: AC

## 2020-11-08 MED ORDER — SODIUM CHLORIDE 0.9 % IV SOLN: INTRAVENOUS | Status: DC

## 2020-11-08 MED ORDER — DEXAMETHASONE SODIUM PHOSPHATE 10 MG/ML IJ SOLN: 8.0000 mg | Freq: Once | INTRAMUSCULAR | Status: AC

## 2020-11-08 MED ORDER — CHLORHEXIDINE GLUCONATE 4 % EX LIQD: 60.0000 mL | Freq: Once | CUTANEOUS | Status: DC

## 2020-11-09 ENCOUNTER — Inpatient Hospital Stay
Admission: RE | Admit: 2020-11-09 | Discharge: 2020-11-14 | DRG: 466 | Disposition: A | Discharge status: Home Health | Payer: Medicare Other | Attending: Orthopedic Surgery | Admitting: Orthopedic Surgery

## 2020-11-09 ENCOUNTER — Other Ambulatory Visit: Payer: Self-pay

## 2020-11-09 ENCOUNTER — Encounter
Admission: RE | Disposition: A | Discharge status: Home Health | Payer: Self-pay | Source: Home / Self Care | Attending: Orthopedic Surgery

## 2020-11-09 ENCOUNTER — Inpatient Hospital Stay: Payer: Medicare Other | Admitting: Urgent Care

## 2020-11-09 ENCOUNTER — Inpatient Hospital Stay: Payer: Medicare Other

## 2020-11-09 ENCOUNTER — Encounter: Payer: Self-pay | Admitting: Orthopedic Surgery

## 2020-11-09 DIAGNOSIS — J811 Chronic pulmonary edema: Secondary | ICD-10-CM | POA: Diagnosis not present

## 2020-11-09 DIAGNOSIS — I700 Atherosclerosis of aorta: Secondary | ICD-10-CM | POA: Diagnosis not present

## 2020-11-09 DIAGNOSIS — I10 Essential (primary) hypertension: Secondary | ICD-10-CM | POA: Diagnosis present

## 2020-11-09 DIAGNOSIS — I493 Ventricular premature depolarization: Secondary | ICD-10-CM | POA: Diagnosis not present

## 2020-11-09 DIAGNOSIS — Z471 Aftercare following joint replacement surgery: Secondary | ICD-10-CM | POA: Diagnosis not present

## 2020-11-09 DIAGNOSIS — Z8673 Personal history of transient ischemic attack (TIA), and cerebral infarction without residual deficits: Secondary | ICD-10-CM | POA: Diagnosis not present

## 2020-11-09 DIAGNOSIS — Z87442 Personal history of urinary calculi: Secondary | ICD-10-CM

## 2020-11-09 DIAGNOSIS — E118 Type 2 diabetes mellitus with unspecified complications: Secondary | ICD-10-CM | POA: Diagnosis not present

## 2020-11-09 DIAGNOSIS — E669 Obesity, unspecified: Secondary | ICD-10-CM | POA: Diagnosis present

## 2020-11-09 DIAGNOSIS — J9811 Atelectasis: Secondary | ICD-10-CM | POA: Diagnosis not present

## 2020-11-09 DIAGNOSIS — E041 Nontoxic single thyroid nodule: Secondary | ICD-10-CM | POA: Diagnosis not present

## 2020-11-09 DIAGNOSIS — D62 Acute posthemorrhagic anemia: Secondary | ICD-10-CM | POA: Diagnosis not present

## 2020-11-09 DIAGNOSIS — Y831 Surgical operation with implant of artificial internal device as the cause of abnormal reaction of the patient, or of later complication, without mention of misadventure at the time of the procedure: Secondary | ICD-10-CM | POA: Diagnosis present

## 2020-11-09 DIAGNOSIS — N529 Male erectile dysfunction, unspecified: Secondary | ICD-10-CM | POA: Diagnosis present

## 2020-11-09 DIAGNOSIS — Z96642 Presence of left artificial hip joint: Secondary | ICD-10-CM | POA: Diagnosis not present

## 2020-11-09 DIAGNOSIS — U071 COVID-19: Secondary | ICD-10-CM | POA: Diagnosis not present

## 2020-11-09 DIAGNOSIS — R42 Dizziness and giddiness: Secondary | ICD-10-CM | POA: Diagnosis not present

## 2020-11-09 DIAGNOSIS — R000 Tachycardia, unspecified: Secondary | ICD-10-CM | POA: Diagnosis present

## 2020-11-09 DIAGNOSIS — T84031A Mechanical loosening of internal left hip prosthetic joint, initial encounter: Principal | ICD-10-CM | POA: Diagnosis present

## 2020-11-09 DIAGNOSIS — Z7984 Long term (current) use of oral hypoglycemic drugs: Secondary | ICD-10-CM

## 2020-11-09 DIAGNOSIS — E876 Hypokalemia: Secondary | ICD-10-CM | POA: Diagnosis not present

## 2020-11-09 DIAGNOSIS — Z96649 Presence of unspecified artificial hip joint: Secondary | ICD-10-CM | POA: Diagnosis not present

## 2020-11-09 DIAGNOSIS — K760 Fatty (change of) liver, not elsewhere classified: Secondary | ICD-10-CM | POA: Insufficient documentation

## 2020-11-09 DIAGNOSIS — M169 Osteoarthritis of hip, unspecified: Secondary | ICD-10-CM | POA: Diagnosis not present

## 2020-11-09 DIAGNOSIS — E7800 Pure hypercholesterolemia, unspecified: Secondary | ICD-10-CM | POA: Diagnosis not present

## 2020-11-09 DIAGNOSIS — E1169 Type 2 diabetes mellitus with other specified complication: Secondary | ICD-10-CM | POA: Diagnosis present

## 2020-11-09 DIAGNOSIS — Z6837 Body mass index (BMI) 37.0-37.9, adult: Secondary | ICD-10-CM

## 2020-11-09 DIAGNOSIS — R911 Solitary pulmonary nodule: Secondary | ICD-10-CM | POA: Diagnosis not present

## 2020-11-09 DIAGNOSIS — N200 Calculus of kidney: Secondary | ICD-10-CM | POA: Insufficient documentation

## 2020-11-09 DIAGNOSIS — T84039D Mechanical loosening of unspecified internal prosthetic joint, subsequent encounter: Secondary | ICD-10-CM | POA: Diagnosis not present

## 2020-11-09 DIAGNOSIS — K219 Gastro-esophageal reflux disease without esophagitis: Secondary | ICD-10-CM | POA: Diagnosis not present

## 2020-11-09 HISTORY — PX: TOTAL HIP REVISION: SHX763

## 2020-11-09 LAB — GLUCOSE, CAPILLARY
Glucose-Capillary: 136 mg/dL — ABNORMAL HIGH (ref 70–99)
Glucose-Capillary: 208 mg/dL — ABNORMAL HIGH (ref 70–99)
Glucose-Capillary: 196 mg/dL — ABNORMAL HIGH (ref 70–99)
Glucose-Capillary: 188 mg/dL — ABNORMAL HIGH (ref 70–99)

## 2020-11-09 LAB — TYPE AND SCREEN
ABO/RH(D): A POS
Antibody Screen: NEGATIVE

## 2020-11-09 LAB — HEMOGLOBIN AND HEMATOCRIT, BLOOD
HCT: 31.5 % — ABNORMAL LOW (ref 39.0–52.0)
Hemoglobin: 10.8 g/dL — ABNORMAL LOW (ref 13.0–17.0)

## 2020-11-09 SURGERY — TOTAL HIP REVISION
Anesthesia: Spinal | Site: Hip | Laterality: Left

## 2020-11-09 MED ORDER — ALBUMIN HUMAN 5 % IV SOLN: INTRAVENOUS | Status: DC | PRN

## 2020-11-09 MED ORDER — HYDROMORPHONE HCL 1 MG/ML IJ SOLN
INTRAMUSCULAR | Status: DC | PRN
  Administered 2020-11-09: 1 mg via INTRAVENOUS

## 2020-11-09 MED ORDER — PHENYLEPHRINE HCL (PRESSORS) 10 MG/ML IV SOLN
INTRAVENOUS | Status: DC | PRN
  Administered 2020-11-09 (×5): 100 ug via INTRAVENOUS
  Administered 2020-11-09: 200 ug via INTRAVENOUS
  Administered 2020-11-09 (×2): 100 ug via INTRAVENOUS

## 2020-11-09 MED ORDER — CHLORHEXIDINE GLUCONATE 0.12 % MT SOLN
OROMUCOSAL | Status: AC
  Administered 2020-11-09: 15 mL via OROMUCOSAL
  Filled 2020-11-09: qty 15

## 2020-11-09 MED ORDER — ACETAMINOPHEN 10 MG/ML IV SOLN
INTRAVENOUS | Status: DC | PRN
  Administered 2020-11-09: 1000 mg via INTRAVENOUS

## 2020-11-09 MED ORDER — PHENOL 1.4 % MT LIQD
1.0000 | OROMUCOSAL | Status: DC | PRN
  Filled 2020-11-09: qty 177

## 2020-11-09 MED ORDER — ACETAMINOPHEN 10 MG/ML IV SOLN
INTRAVENOUS | Status: AC
  Filled 2020-11-09: qty 100

## 2020-11-09 MED ORDER — ROCURONIUM BROMIDE 100 MG/10ML IV SOLN
INTRAVENOUS | Status: DC | PRN
  Administered 2020-11-09: 5 mg via INTRAVENOUS

## 2020-11-09 MED ORDER — SUGAMMADEX SODIUM 500 MG/5ML IV SOLN
INTRAVENOUS | Status: DC | PRN
  Administered 2020-11-09: 100 mg via INTRAVENOUS

## 2020-11-09 MED ORDER — EPHEDRINE SULFATE 50 MG/ML IJ SOLN
INTRAMUSCULAR | Status: DC | PRN
  Administered 2020-11-09 (×5): 5 mg via INTRAVENOUS

## 2020-11-09 MED ORDER — PROPOFOL 500 MG/50ML IV EMUL
INTRAVENOUS | Status: AC
  Filled 2020-11-09: qty 50

## 2020-11-09 MED ORDER — FLEET ENEMA 7-19 GM/118ML RE ENEM: 1.0000 | ENEMA | Freq: Once | RECTAL | Status: DC | PRN

## 2020-11-09 MED ORDER — FENTANYL CITRATE (PF) 100 MCG/2ML IJ SOLN
INTRAMUSCULAR | Status: DC | PRN
  Administered 2020-11-09 (×3): 25 ug via INTRAVENOUS
  Administered 2020-11-09: 50 ug via INTRAVENOUS
  Administered 2020-11-09 (×5): 25 ug via INTRAVENOUS

## 2020-11-09 MED ORDER — METOPROLOL TARTRATE 5 MG/5ML IV SOLN
INTRAVENOUS | Status: AC
  Filled 2020-11-09: qty 5

## 2020-11-09 MED ORDER — ONDANSETRON HCL 4 MG PO TABS: 4.0000 mg | ORAL_TABLET | Freq: Four times a day (QID) | ORAL | Status: DC | PRN

## 2020-11-09 MED ORDER — TRANEXAMIC ACID-NACL 1000-0.7 MG/100ML-% IV SOLN: 1000.0000 mg | Freq: Once | INTRAVENOUS | Status: AC

## 2020-11-09 MED ORDER — HYDROMORPHONE HCL 1 MG/ML IJ SOLN
0.5000 mg | INTRAMUSCULAR | Status: DC | PRN
  Administered 2020-11-09 – 2020-11-11 (×3): 1 mg via INTRAVENOUS
  Filled 2020-11-09 (×3): qty 1

## 2020-11-09 MED ORDER — DEXAMETHASONE SODIUM PHOSPHATE 10 MG/ML IJ SOLN
INTRAMUSCULAR | Status: AC
  Administered 2020-11-09: 8 mg via INTRAVENOUS
  Filled 2020-11-09: qty 1

## 2020-11-09 MED ORDER — CEFAZOLIN SODIUM-DEXTROSE 2-4 GM/100ML-% IV SOLN
2.0000 g | Freq: Four times a day (QID) | INTRAVENOUS | Status: AC
  Administered 2020-11-09 – 2020-11-10 (×2): 2 g via INTRAVENOUS
  Filled 2020-11-09 (×2): qty 100

## 2020-11-09 MED ORDER — SODIUM CHLORIDE 0.9 % IV SOLN
INTRAVENOUS | Status: DC | PRN
  Administered 2020-11-09: 35 ug/min via INTRAVENOUS

## 2020-11-09 MED ORDER — BUPIVACAINE HCL (PF) 0.5 % IJ SOLN
INTRAMUSCULAR | Status: DC | PRN
  Administered 2020-11-09: 2 mL

## 2020-11-09 MED ORDER — TRANEXAMIC ACID-NACL 1000-0.7 MG/100ML-% IV SOLN
INTRAVENOUS | Status: AC
  Administered 2020-11-09: 1000 mg via INTRAVENOUS
  Filled 2020-11-09: qty 100

## 2020-11-09 MED ORDER — SODIUM CHLORIDE (PF) 0.9 % IJ SOLN
INTRAMUSCULAR | Status: AC
  Filled 2020-11-09: qty 20

## 2020-11-09 MED ORDER — METFORMIN HCL 500 MG PO TABS
1000.0000 mg | ORAL_TABLET | Freq: Two times a day (BID) | ORAL | Status: DC
  Administered 2020-11-10 – 2020-11-14 (×9): 1000 mg via ORAL
  Filled 2020-11-09 (×9): qty 2

## 2020-11-09 MED ORDER — ONDANSETRON HCL 4 MG/2ML IJ SOLN: 4.0000 mg | Freq: Once | INTRAMUSCULAR | Status: DC | PRN

## 2020-11-09 MED ORDER — ROCURONIUM BROMIDE 10 MG/ML (PF) SYRINGE
PREFILLED_SYRINGE | INTRAVENOUS | Status: AC
  Filled 2020-11-09: qty 10

## 2020-11-09 MED ORDER — SODIUM CHLORIDE 0.9 % IV SOLN: INTRAVENOUS | Status: DC

## 2020-11-09 MED ORDER — NEOMYCIN-POLYMYXIN B GU 40-200000 IR SOLN
Status: DC | PRN
  Administered 2020-11-09: 2 mL

## 2020-11-09 MED ORDER — BISACODYL 10 MG RE SUPP: 10.0000 mg | Freq: Every day | RECTAL | Status: DC | PRN

## 2020-11-09 MED ORDER — LIDOCAINE HCL (PF) 2 % IJ SOLN
INTRAMUSCULAR | Status: AC
  Filled 2020-11-09: qty 5

## 2020-11-09 MED ORDER — MIDAZOLAM HCL 2 MG/2ML IJ SOLN
INTRAMUSCULAR | Status: DC | PRN
  Administered 2020-11-09: 2 mg via INTRAVENOUS

## 2020-11-09 MED ORDER — FENTANYL CITRATE (PF) 100 MCG/2ML IJ SOLN
25.0000 ug | INTRAMUSCULAR | Status: DC | PRN
  Administered 2020-11-09 (×2): 25 ug via INTRAVENOUS

## 2020-11-09 MED ORDER — TRANEXAMIC ACID-NACL 1000-0.7 MG/100ML-% IV SOLN
INTRAVENOUS | Status: AC
  Filled 2020-11-09: qty 100

## 2020-11-09 MED ORDER — PROPOFOL 10 MG/ML IV BOLUS
INTRAVENOUS | Status: AC
  Filled 2020-11-09: qty 20

## 2020-11-09 MED ORDER — ESMOLOL HCL 100 MG/10ML IV SOLN
INTRAVENOUS | Status: DC | PRN
  Administered 2020-11-09: 20 mg via INTRAVENOUS
  Administered 2020-11-09 (×2): 10 mg via INTRAVENOUS

## 2020-11-09 MED ORDER — GABAPENTIN 300 MG PO CAPS
ORAL_CAPSULE | ORAL | Status: AC
  Administered 2020-11-09: 300 mg via ORAL
  Filled 2020-11-09: qty 1

## 2020-11-09 MED ORDER — TRAMADOL HCL 50 MG PO TABS
50.0000 mg | ORAL_TABLET | ORAL | Status: DC | PRN
  Administered 2020-11-10: 50 mg via ORAL
  Administered 2020-11-12: 100 mg via ORAL
  Administered 2020-11-12: 50 mg via ORAL
  Filled 2020-11-09 (×2): qty 1
  Filled 2020-11-09: qty 2

## 2020-11-09 MED ORDER — FENTANYL CITRATE (PF) 100 MCG/2ML IJ SOLN
INTRAMUSCULAR | Status: AC
  Administered 2020-11-09: 25 ug via INTRAVENOUS
  Filled 2020-11-09: qty 2

## 2020-11-09 MED ORDER — ESMOLOL HCL 100 MG/10ML IV SOLN
INTRAVENOUS | Status: AC
  Filled 2020-11-09: qty 10

## 2020-11-09 MED ORDER — TETRACAINE HCL 1 % IJ SOLN
INTRAMUSCULAR | Status: DC | PRN
  Administered 2020-11-09: 10 mg via INTRASPINAL

## 2020-11-09 MED ORDER — AMLODIPINE BESYLATE 10 MG PO TABS
10.0000 mg | ORAL_TABLET | Freq: Every day | ORAL | Status: DC
  Administered 2020-11-10 – 2020-11-14 (×5): 10 mg via ORAL
  Filled 2020-11-09 (×5): qty 1

## 2020-11-09 MED ORDER — MIDAZOLAM HCL 2 MG/2ML IJ SOLN
INTRAMUSCULAR | Status: AC
  Filled 2020-11-09: qty 2

## 2020-11-09 MED ORDER — PRAVASTATIN SODIUM 20 MG PO TABS
40.0000 mg | ORAL_TABLET | Freq: Every day | ORAL | Status: DC
  Administered 2020-11-10 – 2020-11-13 (×4): 40 mg via ORAL
  Filled 2020-11-09 (×4): qty 2

## 2020-11-09 MED ORDER — MENTHOL 3 MG MT LOZG
1.0000 | LOZENGE | OROMUCOSAL | Status: DC | PRN
  Filled 2020-11-09: qty 9

## 2020-11-09 MED ORDER — PANTOPRAZOLE SODIUM 40 MG PO TBEC
40.0000 mg | DELAYED_RELEASE_TABLET | Freq: Two times a day (BID) | ORAL | Status: DC
  Administered 2020-11-09 – 2020-11-14 (×10): 40 mg via ORAL
  Filled 2020-11-09 (×10): qty 1

## 2020-11-09 MED ORDER — PROPOFOL 500 MG/50ML IV EMUL
INTRAVENOUS | Status: DC | PRN
  Administered 2020-11-09: 50 ug/kg/min via INTRAVENOUS

## 2020-11-09 MED ORDER — DEXAMETHASONE SODIUM PHOSPHATE 10 MG/ML IJ SOLN
INTRAMUSCULAR | Status: AC
  Filled 2020-11-09: qty 1

## 2020-11-09 MED ORDER — MAGNESIUM HYDROXIDE 400 MG/5ML PO SUSP
30.0000 mL | Freq: Every day | ORAL | Status: DC
  Administered 2020-11-10 – 2020-11-14 (×5): 30 mL via ORAL
  Filled 2020-11-09 (×5): qty 30

## 2020-11-09 MED ORDER — DIPHENHYDRAMINE HCL 12.5 MG/5ML PO ELIX: 12.5000 mg | ORAL_SOLUTION | ORAL | Status: DC | PRN

## 2020-11-09 MED ORDER — CEFAZOLIN SODIUM-DEXTROSE 2-4 GM/100ML-% IV SOLN
INTRAVENOUS | Status: AC
  Filled 2020-11-09: qty 100

## 2020-11-09 MED ORDER — INSULIN ASPART 100 UNIT/ML ~~LOC~~ SOLN
0.0000 [IU] | Freq: Three times a day (TID) | SUBCUTANEOUS | Status: DC
  Administered 2020-11-10 (×2): 2 [IU] via SUBCUTANEOUS
  Administered 2020-11-10: 3 [IU] via SUBCUTANEOUS
  Administered 2020-11-11 – 2020-11-13 (×5): 2 [IU] via SUBCUTANEOUS
  Filled 2020-11-09 (×10): qty 1

## 2020-11-09 MED ORDER — SUCCINYLCHOLINE CHLORIDE 20 MG/ML IJ SOLN
INTRAMUSCULAR | Status: DC | PRN
  Administered 2020-11-09: 120 mg via INTRAVENOUS

## 2020-11-09 MED ORDER — ONDANSETRON HCL 4 MG/2ML IJ SOLN
INTRAMUSCULAR | Status: AC
  Filled 2020-11-09: qty 2

## 2020-11-09 MED ORDER — ACETAMINOPHEN 10 MG/ML IV SOLN
1000.0000 mg | Freq: Four times a day (QID) | INTRAVENOUS | Status: AC
  Administered 2020-11-10 (×4): 1000 mg via INTRAVENOUS
  Filled 2020-11-09 (×4): qty 100

## 2020-11-09 MED ORDER — ENOXAPARIN SODIUM 40 MG/0.4ML ~~LOC~~ SOLN
40.0000 mg | SUBCUTANEOUS | Status: DC
  Administered 2020-11-10 – 2020-11-14 (×5): 40 mg via SUBCUTANEOUS
  Filled 2020-11-09 (×5): qty 0.4

## 2020-11-09 MED ORDER — PHENYLEPHRINE HCL (PRESSORS) 10 MG/ML IV SOLN
INTRAVENOUS | Status: AC
  Filled 2020-11-09: qty 1

## 2020-11-09 MED ORDER — ONDANSETRON HCL 4 MG/2ML IJ SOLN
INTRAMUSCULAR | Status: DC | PRN
  Administered 2020-11-09: 4 mg via INTRAVENOUS

## 2020-11-09 MED ORDER — EPHEDRINE 5 MG/ML INJ
INTRAVENOUS | Status: AC
  Filled 2020-11-09: qty 10

## 2020-11-09 MED ORDER — HYDROMORPHONE HCL 1 MG/ML IJ SOLN
INTRAMUSCULAR | Status: AC
  Filled 2020-11-09: qty 1

## 2020-11-09 MED ORDER — OXYCODONE HCL 5 MG PO TABS
5.0000 mg | ORAL_TABLET | ORAL | Status: DC | PRN
  Administered 2020-11-10 – 2020-11-11 (×3): 10 mg via ORAL
  Administered 2020-11-11: 5 mg via ORAL
  Administered 2020-11-11 (×2): 10 mg via ORAL
  Administered 2020-11-11: 5 mg via ORAL
  Administered 2020-11-12: 10 mg via ORAL
  Administered 2020-11-12: 5 mg via ORAL
  Administered 2020-11-12 – 2020-11-13 (×4): 10 mg via ORAL
  Filled 2020-11-09 (×11): qty 2
  Filled 2020-11-09: qty 1
  Filled 2020-11-09: qty 2

## 2020-11-09 MED ORDER — PROPOFOL 10 MG/ML IV BOLUS
INTRAVENOUS | Status: AC
  Filled 2020-11-09: qty 40

## 2020-11-09 MED ORDER — FENTANYL CITRATE (PF) 250 MCG/5ML IJ SOLN
INTRAMUSCULAR | Status: AC
  Filled 2020-11-09: qty 5

## 2020-11-09 MED ORDER — ONDANSETRON HCL 4 MG/2ML IJ SOLN: 4.0000 mg | Freq: Four times a day (QID) | INTRAMUSCULAR | Status: DC | PRN

## 2020-11-09 MED ORDER — VITAMIN D 25 MCG (1000 UNIT) PO TABS
5000.0000 [IU] | ORAL_TABLET | Freq: Every day | ORAL | Status: DC
  Administered 2020-11-10 – 2020-11-14 (×5): 5000 [IU] via ORAL
  Filled 2020-11-09 (×5): qty 5

## 2020-11-09 MED ORDER — METOPROLOL TARTRATE 5 MG/5ML IV SOLN
INTRAVENOUS | Status: DC | PRN
  Administered 2020-11-09 (×3): 1 mg via INTRAVENOUS

## 2020-11-09 MED ORDER — METOCLOPRAMIDE HCL 10 MG PO TABS
10.0000 mg | ORAL_TABLET | Freq: Three times a day (TID) | ORAL | Status: AC
  Administered 2020-11-09 – 2020-11-11 (×8): 10 mg via ORAL
  Filled 2020-11-09 (×8): qty 1

## 2020-11-09 MED ORDER — FERROUS SULFATE 325 (65 FE) MG PO TABS
325.0000 mg | ORAL_TABLET | Freq: Two times a day (BID) | ORAL | Status: DC
  Administered 2020-11-10 – 2020-11-14 (×9): 325 mg via ORAL
  Filled 2020-11-09 (×9): qty 1

## 2020-11-09 MED ORDER — CEFAZOLIN SODIUM 1 G IJ SOLR
INTRAMUSCULAR | Status: AC
  Filled 2020-11-09: qty 20

## 2020-11-09 MED ORDER — ALUM & MAG HYDROXIDE-SIMETH 200-200-20 MG/5ML PO SUSP: 30.0000 mL | ORAL | Status: DC | PRN

## 2020-11-09 MED ORDER — GLYCOPYRROLATE 0.2 MG/ML IJ SOLN
INTRAMUSCULAR | Status: AC
  Filled 2020-11-09: qty 1

## 2020-11-09 MED ORDER — ACETAMINOPHEN 325 MG PO TABS: 325.0000 mg | ORAL_TABLET | Freq: Four times a day (QID) | ORAL | Status: DC | PRN

## 2020-11-09 MED ORDER — SENNOSIDES-DOCUSATE SODIUM 8.6-50 MG PO TABS
1.0000 | ORAL_TABLET | Freq: Two times a day (BID) | ORAL | Status: DC
  Administered 2020-11-09 – 2020-11-14 (×10): 1 via ORAL
  Filled 2020-11-09 (×10): qty 1

## 2020-11-09 SURGICAL SUPPLY — 76 items
14mm Trephine ×1 IMPLANT
BIT DRILL Q/COUPLING 1 (BIT) ×1 IMPLANT
BLADE OSCILLATING/SAGITTAL (BLADE) ×2
BLADE SAW 90X25X1.19 OSCILLAT (BLADE) ×1 IMPLANT
BLADE SAW GIGLI 510 (BLADE) ×1 IMPLANT
BLADE SW THK.38XMED LNG THN (BLADE) IMPLANT
BUR CROSS CUT FISSURE 1.2 (BURR) ×1 IMPLANT
BUR DIAMOND COARSE 4.0 RND (BURR) ×1 IMPLANT
CABLE 1.7 (Orthopedic Implant) ×2 IMPLANT
CANISTER SUCT 1200ML W/VALVE (MISCELLANEOUS) ×2 IMPLANT
CANISTER SUCT 3000ML PPV (MISCELLANEOUS) ×4 IMPLANT
COVER BACK TABLE REUSABLE LG (DRAPES) ×2 IMPLANT
COVER WAND RF STERILE (DRAPES) ×2 IMPLANT
DRAPE 3/4 80X56 (DRAPES) ×4 IMPLANT
DRAPE INCISE IOBAN 66X60 STRL (DRAPES) ×2 IMPLANT
DRSG DERMACEA 8X12 NADH (GAUZE/BANDAGES/DRESSINGS) ×2 IMPLANT
DRSG MEPILEX SACRM 8.7X9.8 (GAUZE/BANDAGES/DRESSINGS) ×2 IMPLANT
DRSG OPSITE POSTOP 4X14 (GAUZE/BANDAGES/DRESSINGS) ×2 IMPLANT
DRSG TEGADERM 4X4.75 (GAUZE/BANDAGES/DRESSINGS) ×1 IMPLANT
DURAPREP 26ML APPLICATOR (WOUND CARE) ×2 IMPLANT
GAUZE SPONGE 4X4 12PLY STRL (GAUZE/BANDAGES/DRESSINGS) ×2 IMPLANT
GLOVE BIOGEL PI IND STRL 7.5 (GLOVE) ×1 IMPLANT
GLOVE BIOGEL PI INDICATOR 7.5 (GLOVE) ×1
GLOVE INDICATOR 8.0 STRL GRN (GLOVE) ×2 IMPLANT
GLOVE SURG ENC MOIS LTX SZ7.5 (GLOVE) ×4 IMPLANT
GLOVE SURG ENC TEXT LTX SZ7.5 (GLOVE) ×4 IMPLANT
GOWN STRL REUS W/ TWL LRG LVL3 (GOWN DISPOSABLE) ×2 IMPLANT
GOWN STRL REUS W/ TWL XL LVL3 (GOWN DISPOSABLE) ×1 IMPLANT
GOWN STRL REUS W/TWL LRG LVL3 (GOWN DISPOSABLE) ×4
GOWN STRL REUS W/TWL XL LVL3 (GOWN DISPOSABLE) ×2
HANDPIECE VERSAJET DEBRIDEMENT (MISCELLANEOUS) IMPLANT
HEAD M SROM 36MM 2 (Hips) IMPLANT
HEMOVAC 400CC 10FR (MISCELLANEOUS) ×2 IMPLANT
HOLDER FOLEY CATH W/STRAP (MISCELLANEOUS) ×2 IMPLANT
HOOD PEEL AWAY FLYTE STAYCOOL (MISCELLANEOUS) ×4 IMPLANT
IMPL SOLUTION BOWED 10IN (Hips) IMPLANT
IMPLANT SOLUTION BOWED 10IN (Hips) ×2 IMPLANT
IRRIGATION STRYKERFLOW (MISCELLANEOUS) ×1 IMPLANT
IRRIGATOR STRYKERFLOW (MISCELLANEOUS) ×2
IV NS 100ML SINGLE PACK (IV SOLUTION) ×2 IMPLANT
KIT PEG BOARD PINK (KITS) ×2 IMPLANT
LINER MARATHON 4MM 10DEG 36X52 (Hips) ×1 IMPLANT
MANIFOLD NEPTUNE II (INSTRUMENTS) ×4 IMPLANT
NDL FILTER BLUNT 18X1 1/2 (NEEDLE) ×1 IMPLANT
NDL SAFETY ECLIPSE 18X1.5 (NEEDLE) ×1 IMPLANT
NEEDLE FILTER BLUNT 18X 1/2SAF (NEEDLE) ×1
NEEDLE FILTER BLUNT 18X1 1/2 (NEEDLE) ×1 IMPLANT
NEEDLE HYPO 18GX1.5 SHARP (NEEDLE) ×2
NS IRRIG 1000ML POUR BTL (IV SOLUTION) ×2 IMPLANT
OSTEOTOME THIN 6.0 6 (INSTRUMENTS) ×1 IMPLANT
PACK HIP PROSTHESIS (MISCELLANEOUS) ×2 IMPLANT
PENCIL SMOKE EVACUATOR (MISCELLANEOUS) ×2 IMPLANT
PULSAVAC PLUS IRRIG FAN TIP (DISPOSABLE) ×2
RASP 3.0MM (RASP) ×2 IMPLANT
REAMER TREPHINE 14 8IN (BLADE) ×1 IMPLANT
SCREW CORT ST 4.5X50 (Screw) ×1 IMPLANT
SOL .9 NS 3000ML IRR  AL (IV SOLUTION) ×2
SOL .9 NS 3000ML IRR AL (IV SOLUTION) ×1
SOL .9 NS 3000ML IRR UROMATIC (IV SOLUTION) ×1 IMPLANT
SROM M HEAD 36MM 2 (Hips) ×2 IMPLANT
STAPLER SKIN PROX 35W (STAPLE) ×2 IMPLANT
STRAP SAFETY 5IN WIDE (MISCELLANEOUS) ×2 IMPLANT
SUCTION FRAZIER HANDLE 10FR (MISCELLANEOUS) ×2
SUCTION TUBE FRAZIER 10FR DISP (MISCELLANEOUS) ×1 IMPLANT
SUT ETHIBOND #5 BRAIDED 30INL (SUTURE) ×2 IMPLANT
SUT VIC AB 0 CT1 36 (SUTURE) ×2 IMPLANT
SUT VIC AB 1 CT1 36 (SUTURE) ×4 IMPLANT
SUT VIC AB 2-0 CT1 27 (SUTURE) ×2
SUT VIC AB 2-0 CT1 TAPERPNT 27 (SUTURE) ×1 IMPLANT
SYR 20ML LL LF (SYRINGE) ×2 IMPLANT
TAPE CLOTH 3X10 WHT NS LF (GAUZE/BANDAGES/DRESSINGS) ×2 IMPLANT
TAPE TRANSPORE STRL 2 31045 (GAUZE/BANDAGES/DRESSINGS) ×2 IMPLANT
TIP BRUSH PULSAVAC PLUS 24.33 (MISCELLANEOUS) IMPLANT
TIP FAN IRRIG PULSAVAC PLUS (DISPOSABLE) ×1 IMPLANT
TOWEL OR 17X26 4PK STRL BLUE (TOWEL DISPOSABLE) ×2 IMPLANT
TRAY FOLEY MTR SLVR 16FR STAT (SET/KITS/TRAYS/PACK) ×2 IMPLANT

## 2020-11-09 NOTE — Transfer of Care (Signed)
Immediate Anesthesia Transfer of Care Note  Patient: Jesus Bell  Procedure(s) Performed: TOTAL HIP REVISION (Left Hip)  Patient Location: PACU  Anesthesia Type:General  Level of Consciousness: awake  Airway & Oxygen Therapy: Patient Spontanous Breathing and Patient connected to face mask oxygen  Post-op Assessment: Report given to RN and Post -op Vital signs reviewed and stable  Post vital signs: Reviewed and stable  Last Vitals:  Vitals Value Taken Time  BP    Temp    Pulse    Resp    SpO2      Last Pain:  Vitals:   11/09/20 0628  TempSrc: Oral  PainSc: 0-No pain         Complications: No complications documented.

## 2020-11-09 NOTE — Op Note (Signed)
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 M.D.  ASSISTANT: Cassell Smiles, PA-C (present and scrubbed throughout the case, critical for assistance with exposure, retraction, instrumentation, and closure)  ANESTHESIA: spinal and general  ESTIMATED BLOOD LOSS: 1000 mL  FLUIDS REPLACED: 3000 mL of crystalloid  500 mL of colloid  DRAINS: 2 medium Hemovac drains  IMPLANTS UTILIZED: DePuy 15 mm 10 inch Solution femoral stem, +4 mm neutral 10 degree Marathon polyethylene insert, and a 36 mm M-SPEC -2 mm hip ball  INDICATIONS FOR SURGERY: Jesus Bell is a 65 y.o. year old male who previously underwent left total hip arthroplasty approximately 4 years ago.  He had the onset of persistent left thigh pain and findings were consistent with loosening of the femoral prosthesis.  After discussion of the risks and benefits of surgical intervention, the patient expressed understanding of the risks benefits and agree with plans for total hip revision arthroplasty.   The risks, benefits, and alternatives were discussed at length including but not limited to the risks of infection, bleeding, nerve injury, stiffness, blood clots, the need for revision surgery, limb length inequality, dislocation, cardiopulmonary complications, among others, and they were willing to proceed.  PROCEDURE IN DETAIL: The patient was brought into the operating room and, after adequate spinal and general anesthesia was achieved, the patient was placed in a right lateral decubitus position. Axillary roll was placed and all bony prominences were well-padded. The patient's left hip was cleaned and prepped with alcohol and DuraPrep and draped in the usual sterile fashion. A  "timeout" was performed as per usual protocol. A lateral curvilinear incision was made gently curving towards the posterior superior iliac spine in line with the previous surgical scar. The IT band was incised in line with the skin incision and the fibers of the gluteus maximus were split in line.  Heterotopic ossification was encountered along the posterior aspect of the hip.  The heterotopic bone was carefully removed so as to allow for visualization of the hip joint.  The pseudocapsule was debrided and swabs were obtained for stat Gram stain and cultures.  The femoral head was then dislocated posteriorly.  Careful dissection of the fibrotic tissue around the proximal portion of the implant was performed using curettes and rongeurs.  There was noted to be some motion of the stem.  An attempt was made to tap the stem out using a femoral extractor, but was unsuccessful.  It was thus elected to proceed with an extended trochanteric osteotomy.  Measurements were obtained so as to create a lateral osteotomy measuring approximately 12 cm from the tip of the greater trochanter.  Care was taken to bevel the distal aspect so as to prevent stress risers.  The osteotomized bone was carefully elevated.  A metal cutting bur was used to transect the femoral stem.  A Gigli saw was used to break up the medial interface between the implant and the bone.  Next, a 14 mm trephine was carefully advanced over the distal cylindrical portion of the remaining stem.  The distal stem was removed without difficulty.  A pedestal was encountered and this was carefully drilled.  The femoral canal was then reamed in a sequential fashion up to a 15.5 mm diameter.  The trial was extremely tight and was elected to ream up to a 16 mm diameter.  A 10 inch solution trial was inserted and hip was reduced with good restoration of limb length.  The trial was removed.  Next, the polyethylene was extracted from the metal-backed cup using a 6.5 mm  cancellous screw.  The acetabular component was assessed and felt to be stable.  A +4 mm 10 degree Marathon polyethylene was inserted with the high side at the 4 o'clock position.  Next, the 15 mm 10 inch solution stem was positioned and impacted in place.  Excellent scratch fit was achieved.  Trial reduction was again performed using a -2 mm neck length.  Excellent stability was appreciated.  The trial head ball was removed and a 36 mm M-SPEC head with a -2 mm neck segment was placed on the trunnion and impacted in place.  The hip was then reduced and placed in range of motion with excellent stability noted.  Finally, 2 Synthes cables were placed around the reduced lateral osteotomy and engaged.  Excellent stability of the osteotomy site was achieved.  The wound was irrigated with copious amounts of normal saline with antibiotic solution using pulsatile lavage and then 500 cc of Surgiphor.  Good hemostasis was appreciated.  2 medium Hemovac drains were placed in the wound and brought through separate stab incision.  The IT band was reapproximated using interrupted sutures of #1 Vicryl. Subcutaneous tissue was approximated using first #0 Vicryl followed by #2-0 Vicryl. The skin was closed with skin staples.  The patient tolerated the procedure well and was transported to the recovery room in stable condition.   Marciano Sequin., M.D.

## 2020-11-09 NOTE — H&P (Signed)
ORTHOPAEDIC HISTORY & PHYSICAL Regino Bellow, PA - 10/31/2020 9:15 AM EST Formatting of this note is different from the original. Images from the original note were not included. Chief Complaint Chief Complaint  Patient presents with  . Pre-op Exam  H&P THA revision 11/09/20 [facility]   Reason for Visit Jesus Bell is a 65 y.o. who presents today for history and physical. He is to undergo a left total hip arthroplasty revision on 2020-11-09. Patient was last seen in clinic on 2020-08-31. There is been no change in his condition since that time. Heis 3 yearsstatus post left total hip arthroplasty. Henoted the onset of groin and anterior thigh pain in March without any trauma or aggravating event. The pain has become progressively more severe. He states that the pain is worse with first getting up from a seated or recombent position. The pain improves after he walks for 10-15 minutes. He has resorted to using a walker or cane when he first gets up. He reports some feeling of "weakness" to thelefthip. He did not appreciate any improvement following the corticosteroid injection to the trochanteric bursa performed by Vance Peper.  Three-phase bone scan demonstrated LEFT hip prosthesis with focal abnormal increased tracer accumulation adjacent to the proximal and distal aspects of the  femoral component of the LEFT hip prosthesis consistent with aseptic loosening of the femoral component.   The patient states that his anterior thigh pain has increased in severity.  Lumbar MRI demonstrated L5-S1 right subarticular recess narrowing that could affect theright S1 nerve root. He completed a series of lumbar epidural steroid injections without any significant change in his symptoms. He denies any numbness or bowel/bladder symptoms.   Past Medical History Past Medical History:  Diagnosis Date  . Chicken pox  . Diabetes mellitus type 2, uncomplicated (CMS-HCC)  . Hypertension   Past Surgical  History Past Surgical History:  Procedure Laterality Date  . Bilateral wrist surgery  [facility] (arthrodesis)  . INJECTION THERAPEUTIC AGENT CARPAL TUNNEL Bilateral  . Left total hip arthroplasty 08/17/2017  Dr Marry Guan  . SHOULDER SURGERY Bilateral   Past Family History Family History  Adopted: Yes  Family history unknown: Yes   Medications Current Outpatient Medications Ordered in Epic  Medication Sig Dispense Refill  . acetaminophen (TYLENOL EXTRA STRENGTH) 500 MG tablet Take 1,000 mg by mouth every 8 (eight) hours as needed for Pain  . amLODIPine (NORVASC) 10 MG tablet 10 mg once daily.   Marland Kitchen lovastatin (MEVACOR) 40 MG tablet 40 mg daily with dinner.   . metFORMIN (GLUCOPHAGE) 1000 MG tablet 1,000 mg 2 (two) times daily with meals.   Marland Kitchen omeprazole (PRILOSEC) 40 MG DR capsule 40 mg once daily.   . traMADoL (ULTRAM) 50 mg tablet Take 50 mg by mouth once daily  . VIAGRA 100 mg tablet 100 mg once daily as needed.    No current Epic-ordered facility-administered medications on file.   Allergies Allergies  Allergen Reactions  . 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  Exam BP 132/88  Ht 172.7 cm (5' 7.99")  Wt (!) 112.6 kg (248 lb 3.2 oz)  BMI 37.75 kg/m   General: Well-developed well-nourished male seen in no acute distress.   HEENT: Atraumatic,normocephalic. Pupils are equal and reactive to light. Oropharynx is clear with moist mucosa  Lungs: Clear to  auscultation bilaterally   Cardiovascular: Irregular rate and rhythm. Normal S1, S2. No murmurs. No appreciable gallops or rubs. Peripheral pulses are palpable.  Abdomen: Soft, non-tender, nondistended. Bowel sounds present  Extremity: Right Hip: Pelvic tilt: Negative Limb lengths: Equal with the patient standing Soft tissue swelling:  Negative Erythema: Negative Crepitance: Negative Tenderness: Greater trochanter is nontender to palpation. Moderate pain is elicited by axial compression or extremes of rotation. Atrophy: No atrophy. Fair to good hip flexor and abductor strength. Range of Motion: EXT/FLEX: 0/0/100 ADD/ABD: 30/0/30 IR/ER: 20/0/40  Neurological:  The patient is alert and oriented Sensation to light touch appears to be intact and within normal limits Gross motor strength appeared to be equal to 5/5  Vascular :  Peripheral pulses felt to be palpable. Capillary refill appears to be intact and within normal limits  X-ray  1. AP pelvis, AP, and lateral radiographs of the left hip that were obtained in the office on 08/31/2020 shows a total hip arthroplasty. There is pedestal formation distally. Radiolucent lines are noted at the bone implant interface at the shoulder of the femoral component and along the calcar region. Moderate heterotopic ossification is present. No evidence of fracture or dislocation.   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 Patient currently taking no anticoagulation medication 2. Return to clinic as scheduled postop 3. Patient had no additional questions  This note was generated in part with voice recognition software and I apologize for any typographical errors that were not detected and corrected   Watt Climes PA  Electronically signed by Regino Bellow, PA at 10/31/2020 9:59 AM EST

## 2020-11-09 NOTE — Anesthesia Procedure Notes (Signed)
Procedure Name: Intubation Date/Time: 11/09/2020 2:43 PM Performed by: Demetrius Charity, CRNA Pre-anesthesia Checklist: Patient identified, Patient being monitored, Timeout performed, Emergency Drugs available and Suction available Patient Re-evaluated:Patient Re-evaluated prior to induction Oxygen Delivery Method: Circle system utilized Preoxygenation: Pre-oxygenation with 100% oxygen Induction Type: IV induction Ventilation: Mask ventilation without difficulty Laryngoscope Size: McGraph and 4 Grade View: Grade I Tube type: Oral Tube size: 7.5 mm Number of attempts: 1 Airway Equipment and Method: Stylet and Video-laryngoscopy Placement Confirmation: ETT inserted through vocal cords under direct vision,  positive ETCO2 and breath sounds checked- equal and bilateral Secured at: 23 cm Tube secured with: Tape Dental Injury: Teeth and Oropharynx as per pre-operative assessment

## 2020-11-09 NOTE — H&P (Signed)
The patient has been re-examined, and the chart reviewed, and there have been no interval changes to the documented history and physical.    The risks, benefits, and alternatives have been discussed at length. The patient expressed understanding of the risks benefits and agreed with plans for surgical intervention.  Amaad Byers P. Stran Raper, Jr. M.D.    

## 2020-11-09 NOTE — Anesthesia Procedure Notes (Addendum)
Spinal  Patient location during procedure: OR Start time: 11/09/2020 7:22 AM End time: 11/09/2020 7:35 AM Staffing Performed: anesthesiologist and resident/CRNA  Anesthesiologist: Gunnar Fusi, MD Resident/CRNA: Demetrius Charity, CRNA Preanesthetic Checklist Completed: patient identified, IV checked, site marked, risks and benefits discussed, surgical consent, monitors and equipment checked, pre-op evaluation and timeout performed Spinal Block Patient position: sitting Prep: ChloraPrep Patient monitoring: heart rate, continuous pulse ox, blood pressure and cardiac monitor Approach: midline Location: L4-5 Injection technique: single-shot Needle Needle type: Whitacre and Introducer  Needle gauge: 24 G Needle length: 9 cm Assessment Sensory level: T10 Additional Notes C. Hansford Hirt with attempt x2.  Dr. Ronelle Nigh with successful placement.  Negative paresthesia. Negative blood return. Positive free-flowing CSF. Expiration date of kit checked and confirmed. Patient tolerated procedure well, without complications.

## 2020-11-09 NOTE — Anesthesia Preprocedure Evaluation (Addendum)
Anesthesia Evaluation  Patient identified by MRN, date of birth, ID band  Reviewed: Allergy & Precautions, NPO status , Patient's Chart, lab work & pertinent test results  History of Anesthesia Complications Negative for: history of anesthetic complications  Airway Mallampati: III       Dental   Pulmonary neg sleep apnea, neg COPD, Not current smoker,           Cardiovascular hypertension, Pt. on medications (-) Past MI and (-) CHF (-) dysrhythmias (-) Valvular Problems/Murmurs     Neuro/Psych neg Seizures CVA (R arm weakness, resolved), No Residual Symptoms    GI/Hepatic Neg liver ROS, PUD, GERD  Medicated,  Endo/Other  diabetes, Type 2, Oral Hypoglycemic Agents  Renal/GU Renal disease (stones)     Musculoskeletal   Abdominal (+) + obese,   Peds  Hematology   Anesthesia Other Findings   Reproductive/Obstetrics                             Anesthesia Physical Anesthesia Plan  ASA: III  Anesthesia Plan: Spinal   Post-op Pain Management:    Induction:   PONV Risk Score and Plan:   Airway Management Planned:   Additional Equipment:   Intra-op Plan:   Post-operative Plan:   Informed Consent: I have reviewed the patients History and Physical, chart, labs and discussed the procedure including the risks, benefits and alternatives for the proposed anesthesia with the patient or authorized representative who has indicated his/her understanding and acceptance.       Plan Discussed with:   Anesthesia Plan Comments:        Anesthesia Quick Evaluation

## 2020-11-10 LAB — GLUCOSE, CAPILLARY
Glucose-Capillary: 149 mg/dL — ABNORMAL HIGH (ref 70–99)
Glucose-Capillary: 161 mg/dL — ABNORMAL HIGH (ref 70–99)
Glucose-Capillary: 135 mg/dL — ABNORMAL HIGH (ref 70–99)
Glucose-Capillary: 152 mg/dL — ABNORMAL HIGH (ref 70–99)

## 2020-11-10 LAB — HEMOGLOBIN AND HEMATOCRIT, BLOOD
HCT: 28.3 % — ABNORMAL LOW (ref 39.0–52.0)
Hemoglobin: 9.6 g/dL — ABNORMAL LOW (ref 13.0–17.0)

## 2020-11-10 NOTE — Plan of Care (Signed)

## 2020-11-10 NOTE — Progress Notes (Signed)
Inpatient Rehab Admissions Coordinator:    Per therapy recs, pt was screened for appropriateness for CIR.  Pt. Does demonstrate functional decline, but Pt.  does not demonstrate medical necessity for CIR admission at this time. He would likely benefit from rehab at lower level of care.  Please contact me with any questions.   Clemens Catholic, [place], [place] Admissions Coordinator  [phone] ([place]) [phone] (office)

## 2020-11-10 NOTE — Anesthesia Postprocedure Evaluation (Signed)
Anesthesia Post Note  Patient: Jesus Bell  Procedure(s) Performed: TOTAL HIP REVISION (Left Hip)  Patient location during evaluation: PACU Anesthesia Type: Spinal and General Level of consciousness: awake and alert Pain management: pain level controlled Vital Signs Assessment: post-procedure vital signs reviewed and stable Respiratory status: spontaneous breathing, nonlabored ventilation, respiratory function stable and patient connected to nasal cannula oxygen Cardiovascular status: blood pressure returned to baseline and stable Postop Assessment: no apparent nausea or vomiting Anesthetic complications: no   No complications documented.   Last Vitals:  Vitals:   11/10/20 0033 11/10/20 0033  BP: 137/89 137/89  Pulse: 95 91  Resp: 18 18  Temp: 36.6 C 36.6 C  SpO2: 100% 100%    Last Pain:  Vitals:   11/09/20 2214  TempSrc:   PainSc: 10-Worst pain ever                 Tera Mater

## 2020-11-10 NOTE — Progress Notes (Addendum)
Physical Therapy Treatment Patient Details Name: Jesus Bell MRN: [id number] DOB: 02-22-1956 Today's Date: 11/10/2020    History of Present Illness Pt admitted to [facility] on 11/09/20 for elective L THA revision. He is 58yr s/p L THA. Pt is WBAT on LLE and with posterior hip precautions including: no hip flexion >90deg, no hip IR, and no hip ADD. Significant PMH includes: stroke.    PT Comments    Pt tolerated treatment well today and was able to improve overall pain levels, assistance levels, and activity tolerance since AM session. Pt required mod assist for BLE facilitation onto bed, min assist for transfers, and min assist for short distance ambulation. Pt required increased assistance for performance of LLE LAQ, heel slides, and hip ABD/ADD due to increased pain and weakness. Despite progress, pt continues to be limited with meeting goals secondary to pain and weakness. Pt able to recall 3/3 posterior hip precautions and WB precautions without cueing. Pt will continue to benefit from skilled acute PT services to address deficits for return to baseline function. Will continue to recommend CIR at DC.  Of note, pt states that wife has tested positive for COVID. Pt with no c/o symptoms during session, but did cough a few times. MD and RN notified.   Follow Up Recommendations  CIR     Equipment Recommendations  None recommended by PT    Recommendations for Other Services Rehab consult     Precautions / Restrictions Precautions Precautions: Posterior Hip;Fall Precaution Booklet Issued: Yes (comment) Restrictions Weight Bearing Restrictions: Yes LLE Weight Bearing: Weight bearing as tolerated    Mobility  Bed Mobility Overal bed mobility: Needs Assistance Bed Mobility: Rolling;Sit to Supine Rolling: Min guard Sidelying to sit: Min assist;HOB elevated   Sit to supine: Mod assist   General bed mobility comments: Mod assist for BLE facilitation onto bed. CGA for rolling     Transfers Overall transfer level: Needs assistance Equipment used: Rolling walker (2 wheeled) Transfers: Sit to/from Stand Sit to Stand: Min assist         General transfer comment: Min assist to stand from recliner with RW; multimodal cues for sequencing, maintenance of precautions, and hand placement. Increased time/effort.  Ambulation/Gait Ambulation/Gait assistance: Min assist Gait Distance (Feet): 2 Feet Assistive device: Rolling walker (2 wheeled)   Gait velocity: decreased   General Gait Details: Min assist to ambulate 80ft with RW from recliner>EOB. Pt demonstrates forward flexed posture with increased UE support through forearm on RW, decreased step length bil, and decreased L foot clearance during LLE facilitation.     Balance Overall balance assessment: Needs assistance Sitting-balance support: Bilateral upper extremity supported;Feet supported Sitting balance-Leahy Scale: Good Sitting balance - Comments: No LOB seated EOB   Standing balance support: Bilateral upper extremity supported Standing balance-Leahy Scale: Poor Standing balance comment: Poor standing balance due to fatigue and increased weakness, requiring min assist for upright mobility with RW                            Cognition Arousal/Alertness: Awake/alert Behavior During Therapy: WFL for tasks assessed/performed Overall Cognitive Status: Within Functional Limits for tasks assessed                                 General Comments: Pt able to recall 3/3 posterior hip precautions and WB precautions without cues      Exercises Total  Joint Exercises Ankle Circles/Pumps: AROM;Strengthening;Both;10 reps;Seated Quad Sets: AROM;Strengthening;Both;10 reps;Supine Gluteal Sets: AROM;Strengthening;Both;10 reps;Seated Short Arc Quad: AROM;Strengthening;Both;10 reps;Supine Heel Slides: Strengthening;AROM;Right;AAROM;Left;10 reps;Supine Hip ABduction/ADduction:  Strengthening;AROM;Right;AAROM;Left;10 reps;Supine Long Arc Quad: Strengthening;AROM;Right;AAROM;Left;10 reps;Supine Other Exercises Other Exercises: Pt able to participate in bed mobility, transfers, and short distance ambulation with RW. Pt required increased assistance for upright mobility due to pain and fatigue. Increased cueing required for maintenance of precautions, safety, and for pain management. Antalgic gait noted with forward flexed posture and decreased LLE clearance during swing. Other Exercises: Pt educated regarding: PT role/POC, WB precautions, posterior hip precautions, Sunday session, DC recommendation, safety with mobility, ice, and pain management techniques.    General Comments General comments (skin integrity, edema, etc.): Minimal bloody drainage noted on L hip dressing; increased pain behaviors with mobility. Ice donned to L hip post session.      Pertinent Vitals/Pain Pain Assessment: 0-10 Pain Score: 5  Pain Location: L hip Pain Descriptors / Indicators: Discomfort;Dull;Grimacing;Operative site guarding Pain Intervention(s): Limited activity within patient's tolerance;Monitored during session;Premedicated before session;Repositioned;Patient requesting pain meds-RN notified;Relaxation;Ice applied    Home Living Family/patient expects to be discharged to:: Private residence Living Arrangements: Spouse/significant other Available Help at Discharge: Family;Friend(s);Available 24 hours/day Type of Home: House Home Access: Stairs to enter Entrance Stairs-Rails: Can reach both Home Layout: One level Home Equipment: Walker - 4 wheels;Walker - standard;Cane - single point;Bedside commode;Shower seat;Transport chair;Adaptive equipment      Prior Function Level of Independence: Independent      Comments: 4WW as needed for pain, + drives   PT Goals (current goals can now be found in the care plan section) Acute Rehab PT Goals Patient Stated Goal: To return to PLOF  and go fishing PT Goal Formulation: With patient Time For Goal Achievement: 11/24/20 Potential to Achieve Goals: Fair Progress towards PT goals: Progressing toward goals    Frequency    BID      PT Plan Current plan remains appropriate       AM-PAC PT "6 Clicks" Mobility   Outcome Measure  Help needed turning from your back to your side while in a flat bed without using bedrails?: A Little Help needed moving from lying on your back to sitting on the side of a flat bed without using bedrails?: A Little Help needed moving to and from a bed to a chair (including a wheelchair)?: A Little Help needed standing up from a chair using your arms (e.g., wheelchair or bedside chair)?: A Little Help needed to walk in hospital room?: A Little Help needed climbing 3-5 steps with a railing? : A Lot 6 Click Score: 17    End of Session Equipment Utilized During Treatment: Gait belt Activity Tolerance: Patient tolerated treatment well;Patient limited by fatigue;Patient limited by pain Patient left: with call bell/phone within reach;in bed;with bed alarm set (BLE elevated on pillow, L hip drain intact, SCD's donned) Nurse Communication: Mobility status;Patient requests pain meds PT Visit Diagnosis: Unsteadiness on feet (R26.81);Muscle weakness (generalized) (M62.81);Difficulty in walking, not elsewhere classified (R26.2);Pain Pain - Right/Left: Left Pain - part of body: Hip     Time: 4034-7425 PT Time Calculation (min) (ACUTE ONLY): 36 min  Charges:  $Therapeutic Exercise: 8-22 mins $Therapeutic Activity: 8-22 mins            Herminio Commons, PT, DPT 3:27 PM,11/10/20

## 2020-11-10 NOTE — Evaluation (Signed)
Occupational Therapy Evaluation Patient Details Name: Jesus Bell MRN: [id number] DOB: [birth year] Today's Date: 11/10/2020    History of Present Illness Pt admitted to [facility] on 11/09/20 for elective L THA revision. He is 11yr s/p L THA. Pt is WBAT on LLE and with posterior hip precautions including: no hip flexion >90deg, no hip IR, and no hip ADD. Significant PMH includes: stroke.   Clinical Impression   Jesus Bell was seen for OT/PT co-evaluation this date, POD#1 from above surgery. Pt was independent in all ADLs prior to surgery, however occasionally using 4WW for mobility due to L hip pain. Pt is eager to return to PLOF with less pain and improved safety and independence. Pt able to recall 2/3 posterior total hip precautions at start of session. Pt instructed in posterior total hip precautions and how to implement, self care skills, falls prevention strategies, and home/routines modifications.   Upon arrival, pt reporting 7/10 L hip pain however eager to get to chair and change out of wet gown. Pt requires MAX A for LBD at bed level. MIN A exit R side of bed and significantly increased time to complete. BUE support for good static sitting balance. MIN A x2 + RW for simulated BSC t/f - anticipate MAX A perihygiene in standing. Pt would benefit from additional instruction in self care skills and techniques to help maintain precautions with or without assistive devices to support recall and carryover prior to discharge. Recommend CIR upon discharge.       Follow Up Recommendations  CIR (may progress)    Equipment Recommendations  None recommended by OT    Recommendations for Other Services       Precautions / Restrictions Precautions Precautions: Posterior Hip;Fall Precaution Booklet Issued: Yes (comment) Restrictions Weight Bearing Restrictions: Yes LLE Weight Bearing: Weight bearing as tolerated      Mobility Bed Mobility Overal bed mobility: Needs Assistance Bed Mobility:  Rolling;Sidelying to Sit Rolling: Min guard Sidelying to sit: Min assist;HOB elevated       General bed mobility comments: significantly increased time. Assist to scoot forward to EOB    Transfers Overall transfer level: Needs assistance Equipment used: Rolling walker (2 wheeled) Transfers: Sit to/from [facility] Sit to Stand: Max assist;From elevated surface Stand pivot transfers: Min assist;+2 physical assistance            Balance Overall balance assessment: Needs assistance Sitting-balance support: Bilateral upper extremity supported;Feet supported Sitting balance-Leahy Scale: Good     Standing balance support: Bilateral upper extremity supported Standing balance-Leahy Scale: Fair                             ADL either performed or assessed with clinical judgement   ADL Overall ADL's : Needs assistance/impaired                                       General ADL Comments: CGA urinal use seated EOB. MAX A for LBD at bed level. MIN A x2 + RW for simulated BSC t/f - anticipate MAX A perihygiene in standing                  Pertinent Vitals/Pain Pain Assessment: 0-10 Pain Score: 7  Pain Location: L hip Pain Descriptors / Indicators: Discomfort;Dull;Grimacing;Operative site guarding Pain Intervention(s): Limited activity within patient's tolerance;Premedicated before session;Repositioned  Hand Dominance     Extremity/Trunk Assessment Upper Extremity Assessment Upper Extremity Assessment: Overall WFL for tasks assessed   Lower Extremity Assessment Lower Extremity Assessment: LLE deficits/detail LLE: Unable to fully assess due to pain       Communication Communication Communication: No difficulties   Cognition Arousal/Alertness: Awake/alert Behavior During Therapy: WFL for tasks assessed/performed Overall Cognitive Status: Within Functional Limits for tasks assessed                                  General Comments: Pt states 2/3 posterior hip pcns at start of session. Joking t/o session   General Comments       Exercises Exercises: Other exercises Other Exercises Other Exercises: Pt educated re: OT role, DME recs, d/c recs, falls prevention, ECS, post hip pcns Other Exercises: LBD, bed mobility, sit<>stand, sitting/standing balance/tolerance, SPT, UBD   Shoulder Instructions      Home Living Family/patient expects to be discharged to:: Private residence Living Arrangements: Spouse/significant other Available Help at Discharge: Family;Friend(s);Available 24 hours/day Type of Home: House Home Access: Stairs to enter [employer] of Steps: 3 Entrance Stairs-Rails: Can reach both Home Layout: One level               Home Equipment: Walker - 4 wheels;Walker - standard;Cane - single point;Bedside commode;Shower seat;Transport chair;Adaptive equipment Adaptive Equipment: Reacher        Prior Functioning/Environment Level of Independence: Independent        Comments: 4WW as needed for pain, + drives        OT Problem List: Decreased strength;Decreased range of motion;Decreased activity tolerance;Impaired balance (sitting and/or standing)      OT Treatment/Interventions: Self-care/ADL training;Therapeutic exercise;DME and/or AE instruction;Energy conservation;Therapeutic activities;Balance training;Patient/family education    OT Goals(Current goals can be found in the care plan section) Acute Rehab OT Goals Patient Stated Goal: To return to PLOF OT Goal Formulation: With patient Time For Goal Achievement: 11/24/20 Potential to Achieve Goals: Good ADL Goals Pt Will Perform Upper Body Bathing: with modified independence;sitting Pt Will Perform Lower Body Dressing: with mod assist;sit to/from stand;with caregiver independent in assisting (c LRAD PRN) Pt Will Transfer to Toilet: with min assist;stand pivot transfer;bedside commode (c LRAD PRN)  OT  Frequency: Min 2X/week   Barriers to D/C: Inaccessible home environment             AM-PAC OT "6 Clicks" Daily Activity     Outcome Measure Help from another person eating meals?: None Help from another person taking care of personal grooming?: None Help from another person toileting, which includes using toliet, bedpan, or urinal?: A Lot Help from another person bathing (including washing, rinsing, drying)?: A Lot Help from another person to put on and taking off regular upper body clothing?: A Little Help from another person to put on and taking off regular lower body clothing?: A Lot 6 Click Score: 17   End of Session Equipment Utilized During Treatment: Gait belt;Rolling walker  Activity Tolerance: Patient tolerated treatment well Patient left: in chair;with call bell/phone within reach;with chair alarm set  OT Visit Diagnosis: Other abnormalities of gait and mobility (R26.89);Muscle weakness (generalized) (M62.81)                Time: 4166-0630 OT Time Calculation (min): 30 min Charges:  OT General Charges $OT Visit: 1 Visit OT Evaluation $OT Eval Low Complexity: 1 Low OT Treatments $Self Care/Home Management :  8-22 mins  Dessie Coma, M.S. OTR/L  11/10/20, 9:14 AM  ascom [phone]

## 2020-11-10 NOTE — Progress Notes (Signed)
PT,  gave verbal consent to share his medical condition with son, Ronnell Esteve. This nurse update son on physical therapy session. This nurse also discussed visitor policy per UZHQU-04 guideline. Son verbalized understanding.

## 2020-11-10 NOTE — Evaluation (Signed)
Physical Therapy Evaluation Patient Details Name: Jesus Bell MRN: [id number] DOB: Jul 13, 1956 Today's Date: 11/10/2020   History of Present Illness  Pt admitted to [facility] on 11/09/20 for elective L THA revision. He is 66yr s/p L THA. Pt is WBAT on LLE and with posterior hip precautions including: no hip flexion >90deg, no hip IR, and no hip ADD. Significant PMH includes: stroke.    Clinical Impression  Pt is a 65 year old M admitted to hospital on 11/09/20 for elective L THA revision; pt with posterior hip precautions and is WBAT on LLE. At baseline, pt was Ind with all ADL's, community ambulation without AD, driving, and splits IADL's with spouse. Pt presents with LLE weakness/decreased AROM, increased pain, decreased balance, and decreased activity tolerance secondary to acuity of sx, resulting in impaired functional mobility from baseline. Due to deficits, pt required Min assist for bed mobility, mod-max assist for transfers, and min assist +2 for short distance ambulation with RW. Pt demonstrates antaglic gait, including absent LLE foot clearance during swing; gait deviations increase the pt's risk of falls. Increased time and cueing for safety, maintenance of precautions, and pain management required throughout session. Pt with fair carry over during session; pt able to recall 3/3 posterior hip and WB precautions post session with minimal visual cues. Deficits limit the pt's ability to safely and independently perform ADL's, transfer, and ambulate. Pt will benefit from acute skilled PT services to address deficits for return to baseline function. Pt notes that wife might be able to provide physical assist at home, and that his friends can help PRN. At this time, PT recommends CIR at DC to improve deficits for return to baseline function and decreased caregiver burden, prior to return home. Will continue to re-assess as appropriate.     Follow Up Recommendations CIR    Equipment Recommendations   None recommended by PT    Recommendations for Other Services Rehab consult     Precautions / Restrictions Precautions Precautions: Posterior Hip;Fall Precaution Booklet Issued: Yes (comment) Restrictions Weight Bearing Restrictions: Yes LLE Weight Bearing: Weight bearing as tolerated      Mobility  Bed Mobility Overal bed mobility: Needs Assistance Bed Mobility: Rolling;Sidelying to Sit Rolling: Min guard Sidelying to sit: Min assist;HOB elevated       General bed mobility comments: Increased time/effort for supine>sit with HOB elevated and UE reliance on handrail. Mod assist via pad for anterior scooting towards EOB    Transfers Overall transfer level: Needs assistance Equipment used: Rolling walker (2 wheeled) Transfers: Sit to/from Stand Sit to Stand: Max assist;From elevated surface Stand pivot transfers: Min assist;+2 physical assistance       General transfer comment: Max assist to stand from elevated surface with RW; multimodal cues for maintenance of precautions, hand placement, and LLE placement. Pt required total assist for LLE positioning to sit in recliner for pain management. Pt demonstrates poor eccentric control.  Ambulation/Gait Ambulation/Gait assistance: Min assist;+2 safety/equipment   Assistive device: Rolling walker (2 wheeled)       General Gait Details: Min assist +2 to ambulate 74ft with RW from EOB>recliner. Pt demonstrates forward flexed posture with increased UE support on RW, decreased step length bil, and absent L foot clearance during LLE facilitation.    Balance Overall balance assessment: Needs assistance Sitting-balance support: Bilateral upper extremity supported;Feet supported Sitting balance-Leahy Scale: Good Sitting balance - Comments: No LOB seated EOB   Standing balance support: Bilateral upper extremity supported Standing balance-Leahy Scale: Poor Standing balance  comment: Poor standing balance due to fatigue and increased  weakness, requiring min assist +2 for upright mobility with RW                             Pertinent Vitals/Pain Pain Assessment: 0-10 Pain Score: 7  Pain Location: L hip Pain Descriptors / Indicators: Discomfort;Dull;Grimacing;Operative site guarding Pain Intervention(s): Limited activity within patient's tolerance;Premedicated before session;Monitored during session;Repositioned    Home Living Family/patient expects to be discharged to:: Private residence Living Arrangements: Spouse/significant other Available Help at Discharge: Family;Friend(s);Available 24 hours/day Type of Home: House Home Access: Stairs to enter Entrance Stairs-Rails: Can reach both Entrance Stairs-Number of Steps: 3 Home Layout: One level Home Equipment: Walker - 4 wheels;Walker - standard;Cane - single point;Bedside commode;Shower seat;Transport chair;Adaptive equipment      Prior Function Level of Independence: Independent         Comments: 4WW as needed for pain, + drives     Hand Dominance        Extremity/Trunk Assessment   Upper Extremity Assessment Upper Extremity Assessment: Defer to OT evaluation    Lower Extremity Assessment Lower Extremity Assessment: Overall WFL for tasks assessed;LLE deficits/detail LLE Deficits / Details: Unable to assess secondary to pain and acuity of sx; pt unable to clear LLE during ambulation LLE: Unable to fully assess due to pain    Cervical / Trunk Assessment Cervical / Trunk Assessment: Normal  Communication   Communication: No difficulties  Cognition Arousal/Alertness: Awake/alert Behavior During Therapy: WFL for tasks assessed/performed Overall Cognitive Status: Within Functional Limits for tasks assessed                                 General Comments: Pt states 2/3 posterior hip pcns at start of session. Joking t/o session. Able to recall 3/3 precautions with min visual cues at end of session      General Comments  General comments (skin integrity, edema, etc.): Minimal bloody drainage noted on L hip dressing. Increased pain behaviors noted with mobility.    Exercises Other Exercises Other Exercises: Pt able to participate in bed mobility, transfers, and short distance ambulation with RW. Pt required increased assistance for upright mobility due to pain and fatigue. Increased cueing required for maintenance of precautions, safety, and for pain management. Antalgic gait noted with pt unable to clear LLE from floor during swing. Other Exercises: Pt educated regarding: PT role/POC, WB precautions, posterior hip precautions, PM session, DC recommendation, safety with mobility, and pain management techniques.   Assessment/Plan    PT Assessment Patient needs continued PT services  PT Problem List Decreased strength;Decreased mobility;Decreased range of motion;Decreased activity tolerance;Decreased balance;Pain       PT Treatment Interventions Gait training;Stair training;Functional mobility training;Therapeutic activities;Therapeutic exercise;Balance training;Neuromuscular re-education;Patient/family education    PT Goals (Current goals can be found in the Care Plan section)  Acute Rehab PT Goals Patient Stated Goal: To return to PLOF and go fishing PT Goal Formulation: With patient Time For Goal Achievement: 11/24/20 Potential to Achieve Goals: Fair    Frequency BID   Barriers to discharge  Questionable physical assist from spouse      Co-evaluation PT/OT/SLP Co-Evaluation/Treatment: Yes Reason for Co-Treatment: For patient/therapist safety;To address functional/ADL transfers PT goals addressed during session: Mobility/safety with mobility;Balance;Proper use of DME;Strengthening/ROM OT goals addressed during session: ADL's and self-care;Proper use of Adaptive equipment and DME  AM-PAC PT "6 Clicks" Mobility  Outcome Measure Help needed turning from your back to your side while in a flat  bed without using bedrails?: A Little Help needed moving from lying on your back to sitting on the side of a flat bed without using bedrails?: A Little Help needed moving to and from a bed to a chair (including a wheelchair)?: A Little Help needed standing up from a chair using your arms (e.g., wheelchair or bedside chair)?: A Lot Help needed to walk in hospital room?: A Little Help needed climbing 3-5 steps with a railing? : A Lot 6 Click Score: 16    End of Session Equipment Utilized During Treatment: Gait belt Activity Tolerance: Patient tolerated treatment well;Patient limited by fatigue;Patient limited by pain Patient left: in chair;with call bell/phone within reach;with chair alarm set (BLE elevated on pillow, L hip drain intact) Nurse Communication: Mobility status PT Visit Diagnosis: Unsteadiness on feet (R26.81);Muscle weakness (generalized) (M62.81);Difficulty in walking, not elsewhere classified (R26.2);Pain Pain - Right/Left: Left Pain - part of body: Hip    Time: 2707-8675 PT Time Calculation (min) (ACUTE ONLY): 29 min   Charges:   PT Evaluation $PT Eval Moderate Complexity: 1 Mod PT Treatments $Therapeutic Activity: 8-22 mins        Herminio Commons, PT, DPT 11:44 AM,11/10/20

## 2020-11-10 NOTE — Progress Notes (Signed)
  Subjective: 1 Day Post-Op Procedure(s) (LRB): TOTAL HIP REVISION (Left) Patient reports pain as moderate but "nothing unexpected." Patient is well, and has had no acute complaints or problems Plan is to go Home after hospital stay. Negative for chest pain and shortness of breath Fever: no Gastrointestinal: negative for nausea and vomiting.   Patient has not had a bowel movement.  Objective: Vital signs in last 24 hours: Temp:  [97.7 F (36.5 C)-98.3 F (36.8 C)] 98.3 F (36.8 C) (02/19 0729) Pulse Rate:  [91-118] 96 (02/19 0729) Resp:  [10-21] 16 (02/19 0729) BP: (127-148)/(84-94) 146/90 (02/19 0729) SpO2:  [91 %-100 %] 99 % (02/19 0729) Weight:  [116.4 kg] 116.4 kg (02/18 2009)  Intake/Output from previous day:  Intake/Output Summary (Last 24 hours) at 11/10/2020 0900 Last data filed at 11/10/2020 0524 Gross per 24 hour  Intake 5270 ml  Output 3700 ml  Net 1570 ml    Intake/Output this shift: No intake/output data recorded.  Labs: Recent Labs    11/09/20 1400 11/10/20 0432  HGB 10.8* 9.6*   Recent Labs    11/09/20 1400 11/10/20 0432  HCT 31.5* 28.3*   No results for input(s): NA, K, CL, CO2, BUN, CREATININE, GLUCOSE, CALCIUM in the last 72 hours. No results for input(s): LABPT, INR in the last 72 hours.   EXAM General - Patient is Alert, Appropriate and Oriented Extremity - Neurovascular intact Dorsiflexion/Plantar flexion intact Compartment soft Dressing/Incision -Hemovac in place. Mild sanguinous drainage noted at proximal most portion of incision. Motor Function - intact, moving foot and toes well on exam.  Cardiovascular- Regular rate and rhythm, no murmurs/rubs/gallops Respiratory- Lungs clear to auscultation bilaterally Gastrointestinal- soft, nontender and active bowel sounds   Assessment/Plan: 1 Day Post-Op Procedure(s) (LRB): TOTAL HIP REVISION (Left) Active Problems:   S/P revision of total hip  Estimated body mass index is 39.02 kg/m  as calculated from the following:   Height as of this encounter: 5\' 8"  (1.727 m).   Weight as of this encounter: 116.4 kg. Advance diet Up with therapy    DVT Prophylaxis - Lovenox, Ted hose and foot pumps Weight-Bearing as tolerated to left leg  Cassell Smiles, PA-C [facility] 11/10/2020, 9:00 AM

## 2020-11-11 ENCOUNTER — Inpatient Hospital Stay: Payer: Medicare Other

## 2020-11-11 ENCOUNTER — Encounter: Payer: Self-pay | Admitting: Orthopedic Surgery

## 2020-11-11 DIAGNOSIS — Z96649 Presence of unspecified artificial hip joint: Secondary | ICD-10-CM

## 2020-11-11 DIAGNOSIS — R000 Tachycardia, unspecified: Secondary | ICD-10-CM | POA: Diagnosis not present

## 2020-11-11 DIAGNOSIS — I10 Essential (primary) hypertension: Secondary | ICD-10-CM | POA: Diagnosis not present

## 2020-11-11 LAB — GLUCOSE, CAPILLARY
Glucose-Capillary: 121 mg/dL — ABNORMAL HIGH (ref 70–99)
Glucose-Capillary: 144 mg/dL — ABNORMAL HIGH (ref 70–99)
Glucose-Capillary: 148 mg/dL — ABNORMAL HIGH (ref 70–99)
Glucose-Capillary: 131 mg/dL — ABNORMAL HIGH (ref 70–99)

## 2020-11-11 LAB — BASIC METABOLIC PANEL
Anion gap: 10 (ref 5–15)
BUN: 10 mg/dL (ref 8–23)
CO2: 25 mmol/L (ref 22–32)
Calcium: 8.6 mg/dL — ABNORMAL LOW (ref 8.9–10.3)
Chloride: 101 mmol/L (ref 98–111)
Creatinine, Ser: 0.87 mg/dL (ref 0.61–1.24)
GFR, Estimated: >60 mL/min (ref >60)
Glucose, Bld: 145 mg/dL — ABNORMAL HIGH (ref 70–99)
Potassium: 3.3 mmol/L — ABNORMAL LOW (ref 3.5–5.1)
Sodium: 136 mmol/L (ref 135–145)

## 2020-11-11 LAB — TROPONIN I (HIGH SENSITIVITY)
Troponin I (High Sensitivity): 98 ng/L — ABNORMAL HIGH (ref <18)
Troponin I (High Sensitivity): 74 ng/L — ABNORMAL HIGH (ref <18)

## 2020-11-11 LAB — HEMOGLOBIN AND HEMATOCRIT, BLOOD
HCT: 24.7 % — ABNORMAL LOW (ref 39.0–52.0)
Hemoglobin: 8.7 g/dL — ABNORMAL LOW (ref 13.0–17.0)

## 2020-11-11 MED ORDER — IOHEXOL 350 MG/ML SOLN
100.0000 mL | Freq: Once | INTRAVENOUS | Status: AC | PRN
  Administered 2020-11-11: 100 mL via INTRAVENOUS

## 2020-11-11 MED ORDER — POTASSIUM CHLORIDE 20 MEQ PO PACK
20.0000 meq | PACK | ORAL | Status: AC
  Administered 2020-11-11 (×2): 20 meq via ORAL
  Filled 2020-11-11 (×2): qty 1

## 2020-11-11 MED ORDER — METOPROLOL TARTRATE 25 MG PO TABS
25.0000 mg | ORAL_TABLET | Freq: Two times a day (BID) | ORAL | Status: DC
  Administered 2020-11-11 – 2020-11-14 (×7): 25 mg via ORAL
  Filled 2020-11-11 (×7): qty 1

## 2020-11-11 MED ORDER — SODIUM CHLORIDE 0.9 % IV SOLN: INTRAVENOUS | Status: DC

## 2020-11-11 MED ORDER — POTASSIUM CHLORIDE 20 MEQ PO PACK
20.0000 meq | PACK | Freq: Two times a day (BID) | ORAL | Status: DC
  Administered 2020-11-11: 20 meq via ORAL
  Filled 2020-11-11: qty 1

## 2020-11-11 MED ORDER — POTASSIUM CHLORIDE CRYS ER 20 MEQ PO TBCR
40.0000 meq | EXTENDED_RELEASE_TABLET | Freq: Once | ORAL | Status: AC
  Administered 2020-11-11: 40 meq via ORAL
  Filled 2020-11-11: qty 2

## 2020-11-11 MED ORDER — SODIUM CHLORIDE 0.9 % IV BOLUS
500.0000 mL | Freq: Once | INTRAVENOUS | Status: AC
  Administered 2020-11-11: 500 mL via INTRAVENOUS

## 2020-11-11 NOTE — Consult Note (Signed)
[facility] Medical Consultation  Jesus Bell YFV:[id number] DOB: Jul 11, 1956 DOA: 11/09/2020 PCP: Wenda Low, MD   Requesting physician: Dr Roland Rack Date of consultation: 11/11/20 Reason for consultation: Tachycardia  Impression/Recommendations Principal Problem:   Tachycardia with heart rate 121-140 beats per minute Active Problems:   Type 2 diabetes mellitus with other specified complication (HCC)   Hypertension   Severe obesity (BMI 35.0-39.9) with comorbidity ([place])   S/P revision of total hip    1. Sinus tachycardia Medical consult requested for sinus tachycardia.  Patient is status post revision left total hip arthroplasty which was done on 11/09/20 and over the last 24 hours has had 2 episodes of documented tachycardia with heart rates as high as 190 bpm.  Patient not on a cardiac monitor at the time Patient denies having any palpitations, no shortness of breath, no chest pain or diaphoresis. He complains of pain at his surgical site Place patient on a cardiac monitor We will obtain a CT angiogram to rule out acute PE Optimize pain control Obtain TSH levels and if low will obtain thyroid studies Consider transfusing patient at least 1 unit of packed RBC due to acute blood loss anemia from surgery.  Patient's hemoglobin on admission was 10.8g/dl with a baseline of 12.7g/dl.  Postop his hemoglobin is 8.7g/dl.   2.  Diabetes mellitus Continue consistent carbohydrate diet Sliding scale insulin for glycemic control   3.  Hypokalemia Supplement potassium   4.  Hypertension Continue amlodipine and metoprolol  I will followup again tomorrow. Please contact me if I can be of assistance in the meanwhile. Thank you for this consultation.  Chief Complaint: Left hip pain  HPI: Patient is a 65 year old African-American male who was admitted to the orthopedic service and is status post revision of the left total hip arthroplasty.  Patient's initial arthroplasty was done  3 years ago and he had been seen by orthopedic surgery for new onset pain in the groin and anterior thigh without any trauma or aggravating event.  Due to worsening of the pain he initially was treated with corticosteroid injection without any improvement and decision was made to revise the initial surgery.   Medical consult was requested because patient was said to have 2 episodes of tachycardia overnight with heart rate as high as 190 bpm.  Patient was noted on a cardiac monitor and is completely asymptomatic except for pain in his left hip, he denies having any chest pain, no shortness of breath, no palpitations, no diaphoresis, no nausea, no vomiting, no abdominal pain, no urinary frequency, no nocturia, no dysuria, no dizziness or lightheadedness. Labs show sodium 136, potassium 3.3, chloride 101, bicarb 25, glucose 145, BUN 10, creatinine 0.87, calcium 9.6, hemoglobin 8.7, hematocrit 24.7  Review of Systems:    Past Medical History:  Diagnosis Date  . Chicken pox   . Diabetes mellitus type 2, uncomplicated ([place])   . Diabetes mellitus without complication ([place])    boarderline  . Erectile dysfunction   . GERD (gastroesophageal reflux disease)   . History of kidney stones   . Hypercholesterolemia   . Hypertension   . Stroke [facility]) 2012   hemorrhagic vessel   Past Surgical History:  Procedure Laterality Date  . CYSTOSCOPY W/ RETROGRADES  05/21/2020   Procedure: CYSTOSCOPY WITH RETROGRADE PYELOGRAM;  Surgeon: Hollice Espy, MD;  Location: [facility] ORS;  Service: Urology;;  . Consuela Mimes W/ URETERAL STENT PLACEMENT Left 07/09/2020   Procedure: CYSTOSCOPY WITH STENT REPLACEMENT;  Surgeon: Hollice Espy, MD;  Location:  [facility] ORS;  Service: Urology;  Laterality: Left;  . CYSTOSCOPY WITH BIOPSY N/A 06/04/2020   Procedure: CYSTOSCOPY WITH bladder neck BIOPSY;  Surgeon: Hollice Espy, MD;  Location: [facility] ORS;  Service: Urology;  Laterality: N/A;  . CYSTOSCOPY/RETROGRADE/URETEROSCOPY Left  07/09/2020   Procedure: CYSTOSCOPY/RETROGRADE/URETEROSCOPY;  Surgeon: Hollice Espy, MD;  Location: [facility] ORS;  Service: Urology;  Laterality: Left;  . CYSTOSCOPY/URETEROSCOPY/HOLMIUM LASER/STENT PLACEMENT Left 05/21/2020   Procedure: CYSTOSCOPY/URETEROSCOPY/STENT PLACEMENT;  Surgeon: Hollice Espy, MD;  Location: [facility] ORS;  Service: Urology;  Laterality: Left;  . CYSTOSCOPY/URETEROSCOPY/HOLMIUM LASER/STENT PLACEMENT Left 06/04/2020   Procedure: CYSTOSCOPY/URETEROSCOPY/HOLMIUM LASER/STENT Exchange;  Surgeon: Hollice Espy, MD;  Location: [facility] ORS;  Service: Urology;  Laterality: Left;  . SHOULDER SURGERY Bilateral    clavicle  . TOTAL HIP ARTHROPLASTY Left 08/17/2017   Procedure: TOTAL HIP ARTHROPLASTY;  Surgeon: Dereck Leep, MD;  Location: [facility] ORS;  Service: Orthopedics;  Laterality: Left;  . WRIST FUSION Left    Social History:  reports that he has never smoked. He has never used smokeless tobacco. He reports that he does not drink alcohol and does not use drugs.  Allergies  Allergen Reactions  . Nsaids Other (See Comments)    History of hemorrhagic stroke. No nsaids or aspirin   . Aspirin Other (See Comments)    States cannot take due to h/o hemorrhagic stroke    Family History  Adopted: Yes  Family history unknown: Yes    Prior to Admission medications   Medication Sig Start Date End Date Taking? Authorizing Provider  acetaminophen (TYLENOL) 325 MG tablet Take 325-650 mg by mouth every 4 (four) hours as needed for mild pain or headache.   Yes [provider]  amLODipine (NORVASC) 10 MG tablet Take 10 mg by mouth daily with breakfast.    Yes [provider]  Cholecalciferol (VITAMIN D3) 125 MCG (5000 UT) TABS Take 5,000 Units by mouth daily.   Yes [provider]  lovastatin (MEVACOR) 40 MG tablet Take 40 mg by mouth daily.    Yes [provider]  metFORMIN (GLUCOPHAGE) 1000 MG tablet Take 1,000 mg by mouth 2 (two) times daily after a  meal.    Yes [provider]  omeprazole (PRILOSEC) 40 MG capsule Take 40 mg by mouth daily.    Yes [provider]  sildenafil (VIAGRA) 100 MG tablet Take 100 mg by mouth as needed for erectile dysfunction.  02/25/17  Yes [provider]  traMADol (ULTRAM) 50 MG tablet Take 50 mg by mouth daily as needed for pain. 10/15/20  Yes [provider]   Physical Exam: Blood pressure 111/71, pulse (!) 181, temperature 99 F (37.2 C), resp. rate 18, height 5\' 8"  (1.727 m), weight 116.4 kg, SpO2 90 %. Vitals:   11/11/20 0746 11/11/20 1130  BP: 108/80 111/71  Pulse: (!) 190 (!) 181  Resp:  18  Temp:  99 F (37.2 C)  SpO2:  90%     General: Patient appears comfortable and in no distress.  He is awake and alert  Eyes: Pale conjunctiva  ENT: Within normal limits  Neck: Supple, no JVD  Cardiovascular: Regular/rate and rhythm S1, S2. Tachycardic  Respiratory: Clear to auscultation bilaterally  Abdomen: Bowel sounds are present, soft, nontender, central adiposity  Skin: Warm and dry  Musculoskeletal: Decreased range of motion left hip, drain in place with bloody drainage  Psychiatric: Normal mood and affect  Neurologic: Able to move all extremities  Labs on Admission:  Basic Metabolic Panel: Recent  Labs  Lab 11/11/20 0843  NA 136  K 3.3*  CL 101  CO2 25  GLUCOSE 145*  BUN 10  CREATININE 0.87  CALCIUM 8.6*   Liver Function Tests: No results for input(s): AST, ALT, ALKPHOS, BILITOT, PROT, ALBUMIN in the last 168 hours. No results for input(s): LIPASE, AMYLASE in the last 168 hours. No results for input(s): AMMONIA in the last 168 hours. CBC: Recent Labs  Lab 11/09/20 1400 11/10/20 0432 11/11/20 0843  HGB 10.8* 9.6* 8.7*  HCT 31.5* 28.3* 24.7*   Cardiac Enzymes: No results for input(s): CKTOTAL, CKMB, CKMBINDEX, TROPONINI in the last 168 hours. BNP: Invalid input(s): POCBNP CBG: Recent Labs  Lab 11/10/20 1200 11/10/20 1635  11/10/20 2106 11/11/20 0739 11/11/20 1132  GLUCAP 161* 135* 152* 121* 144*    Radiological Exams on Admission: DG CHEST PORT 1 VIEW  Result Date: 11/11/2020 CLINICAL DATA:  Tachycardia. EXAM: PORTABLE CHEST 1 VIEW COMPARISON:  None. FINDINGS: 1002 hours. Low lung volumes. The cardio pericardial silhouette is enlarged. There is pulmonary vascular congestion without overt pulmonary edema. No edema or focal airspace consolidation. No substantial pleural effusion. The visualized bony structures of the thorax show no acute abnormality. IMPRESSION: Low lung volumes with vascular congestion. Electronically Signed   By: Misty Stanley M.D.   On: 11/11/2020 10:27   DG Hip Port Unilat With Pelvis 1V Left  Result Date: 11/09/2020 CLINICAL DATA:  Hip replacement EXAM: DG HIP (WITH OR WITHOUT PELVIS) 1V PORT LEFT COMPARISON:  08/17/2017 FINDINGS: Pubic symphysis and rami appear intact. Status post left hip replacement with revision arthroplasty, lengthening of femoral stem and trochanteric osteotomy. Surgical drains over the left hip. Gas in the soft tissues consistent with recent surgery. Cerclage wires about the proximal femur. Probable heterotopic bone adjacent to the lesser trochanter. IMPRESSION: Status post left hip replacement with expected postsurgical changes Electronically Signed   By: Donavan Foil M.D.   On: 11/09/2020 18:56    EKG: Independently reviewed.  Sinus tachycardia  Time spent: 60 minutes  Makalah Asberry [facility] Pager [phone]  If 7PM-7AM, please contact night-coverage [email or web address] Password [email or web address] 11/11/2020, 1:02 PM

## 2020-11-11 NOTE — Progress Notes (Signed)
   11/11/20 0234  Assess: MEWS Score  Temp 98.2 F (36.8 C)  BP (!) 145/74  Pulse Rate (!) 128  Resp 17  SpO2 95 %  O2 Device Room Air  Assess: MEWS Score  MEWS Temp 0  MEWS Systolic 0  MEWS Pulse 2  MEWS RR 0  MEWS LOC 0  MEWS Score 2  MEWS Score Color Yellow  Assess: if the MEWS score is Yellow or Red  Were vital signs taken at a resting state? Yes  Focused Assessment Change from prior assessment (see assessment flowsheet)  Early Detection of Sepsis Score *See Row Information* Low  MEWS guidelines implemented *See Row Information* No, vital signs rechecked  Will continue to monitor patient

## 2020-11-11 NOTE — Progress Notes (Addendum)
   11/11/20 0334  Assess: MEWS Score  Temp 98.7 F (37.1 C)  BP 129/75  Pulse Rate (!) 124  Resp 16  Level of Consciousness Alert  SpO2 94 %  O2 Device Room Air  Patient Activity (if Appropriate) In bed  Assess: MEWS Score  MEWS Temp 0  MEWS Systolic 0  MEWS Pulse 2  MEWS RR 0  MEWS LOC 0  MEWS Score 2  MEWS Score Color Yellow  Assess: if the MEWS score is Yellow or Red  Were vital signs taken at a resting state? Yes  Focused Assessment No change from prior assessment  Early Detection of Sepsis Score *See Row Information* Low  MEWS guidelines implemented *See Row Information* No, previously yellow, continue vital signs every 4 hours  Treat  Pain Scale 0-10  Pain Score 7  Pain Type Acute pain;Surgical pain  Pain Location Hip  Pain Orientation Left  Pain Descriptors / Indicators Aching;Dull  Pain Frequency Constant  Pain Onset On-going  Pain Intervention(s) Medication (See eMAR)  Notify: Charge Nurse/RN  Name of Charge Nurse/RN Notified Debra, RN  Date Charge Nurse/RN Notified 11/11/20  Time Charge Nurse/RN Notified [phone]  Notify: Provider  Provider Name/Title ouma  Date Provider Notified 11/11/20  Time Provider Notified [phone]  Notification Type Page  Notification Reason Other (Comment) (update)  Notified charge nurse and provider of increased HR.  Will continue to monitor.  Provider stated no intervention at this time.

## 2020-11-11 NOTE — Progress Notes (Cosign Needed Addendum)
PT Cancellation Note  Patient Details Name: Jesus Bell MRN: [id number] DOB: 15-Oct-1955   Cancelled Treatment:    Reason Eval/Treat Not Completed: Medical issues which prohibited therapy   Chart reviewed and discussed with LPN.  HR remains elevated and EKG just completed.  Held session at this time given medical concerns and nursing interventions/monitoring.  Will continue as appropriate.  Discussed discharge plan with primary PT. Will change to SNF.   Chesley Noon 11/11/2020, 9:35 AM

## 2020-11-11 NOTE — Plan of Care (Signed)
Patient had BM and urinated using bedside commode. Required max assistance back to bed. Gown, socks, TED hose changed because soiled. Dressing changed and hemovac removed, mini compression dressing applied.  Otw to CTA.

## 2020-11-11 NOTE — Progress Notes (Addendum)
  Subjective: 2 Days Post-Op Procedure(s) (LRB): TOTAL HIP REVISION (Left) Patient reports pain as significant. Describes pain felt over the lateral thigh as well as new "spine pain" felt to be due to his positioning in the bed. Spine pain relieved with repositioning. Chart review and speaking with attending show persistent tachycardia overnight. Patient denies any chest pain, SoB, nausea, or vomiting.  Plan is to go Rehab after hospital stay. Negative for chest pain and shortness of breath Fever: no Gastrointestinal: negative for nausea and vomiting.   Patient has not had a bowel movement.  Objective: Vital signs in last 24 hours: Temp:  [98 F (36.7 C)-99.2 F (37.3 C)] 99.2 F (37.3 C) (02/20 0744) Pulse Rate:  [96-190] 190 (02/20 0746) Resp:  [16-18] 16 (02/20 0744) BP: (108-145)/(74-90) 108/80 (02/20 0746) SpO2:  [93 %-98 %] 93 % (02/20 0744)  Intake/Output from previous day:  Intake/Output Summary (Last 24 hours) at 11/11/2020 0936 Last data filed at 11/11/2020 0200 Gross per 24 hour  Intake 440 ml  Output 1210 ml  Net -770 ml    Intake/Output this shift: No intake/output data recorded.  Labs: Recent Labs    11/09/20 1400 11/10/20 0432  HGB 10.8* 9.6*   Recent Labs    11/09/20 1400 11/10/20 0432  HCT 31.5* 28.3*   No results for input(s): NA, K, CL, CO2, BUN, CREATININE, GLUCOSE, CALCIUM in the last 72 hours. No results for input(s): LABPT, INR in the last 72 hours.   EXAM General - Patient is Alert, Appropriate and Oriented but in increased discomfort from yesterday Extremity - Neurovascular intact Dorsiflexion/Plantar flexion intact Compartment soft Dressing/Incision -Postoperative dressing remains in place., Hemovac in place. Mild sanguinous drainage noted from proximal dressing, unchanged from previous exam Motor Function - intact, moving foot and toes well on exam.  Cardiovascular- tachycardic rate, normal rhythm Respiratory-borderline tachypneic,   minimal crackles heard in right lower lung field, otherwise clear to auscultation bilaterally  Gastrointestinal- soft, nontender and hypoactive bowel sounds   Assessment/Plan: 2 Days Post-Op Procedure(s) (LRB): TOTAL HIP REVISION (Left) Active Problems:   S/P revision of total hip  Estimated body mass index is 39.02 kg/m as calculated from the following:   Height as of this encounter: 5\' 8"  (1.727 m).   Weight as of this encounter: 116.4 kg. Advance diet Up with therapy  Tachycardia, concern for PE  -tachycardia with last O2 sats being 93% ORA and increased respirations  -STAT EKG ordered earlier shows sinus tachycardia with frequent PVCs -still awaiting results from STAT CBC and CMP -order placed for STAT CXR -placed call to hospitalist for STAT consult, awaiting callback currently    Addendum: Spoke with hospitalist, advised to place order for CTA. Order placed.   DVT Prophylaxis - Lovenox, Ted hose and foot pumps Weight-Bearing as tolerated to left leg  Cassell Smiles, PA-C [facility] 11/11/2020, 9:36 AM

## 2020-11-12 ENCOUNTER — Inpatient Hospital Stay (HOSPITAL_COMMUNITY)
Admission: RE | Admit: 2020-11-12 | Discharge: 2020-11-12 | Disposition: A | Discharge status: Home / Self Care | Payer: Medicare Other | Source: Home / Self Care | Attending: Internal Medicine | Admitting: Internal Medicine

## 2020-11-12 DIAGNOSIS — I700 Atherosclerosis of aorta: Secondary | ICD-10-CM | POA: Diagnosis not present

## 2020-11-12 DIAGNOSIS — R000 Tachycardia, unspecified: Secondary | ICD-10-CM | POA: Diagnosis not present

## 2020-11-12 LAB — CBC
HCT: 23.1 % — ABNORMAL LOW (ref 39.0–52.0)
Hemoglobin: 7.9 g/dL — ABNORMAL LOW (ref 13.0–17.0)
MCH: 24.4 pg — ABNORMAL LOW (ref 26.0–34.0)
MCHC: 34.2 g/dL (ref 30.0–36.0)
MCV: 71.3 fL — ABNORMAL LOW (ref 80.0–100.0)
Platelets: 278 10*3/uL (ref 150–400)
RBC: 3.24 MIL/uL — ABNORMAL LOW (ref 4.22–5.81)
RDW: 16.3 % — ABNORMAL HIGH (ref 11.5–15.5)
WBC: 11.9 10*3/uL — ABNORMAL HIGH (ref 4.0–10.5)
nRBC: 0 % (ref 0.0–0.2)

## 2020-11-12 LAB — COMPREHENSIVE METABOLIC PANEL
ALT: 9 U/L (ref 0–44)
AST: 32 U/L (ref 15–41)
Albumin: 2.9 g/dL — ABNORMAL LOW (ref 3.5–5.0)
Alkaline Phosphatase: 67 U/L (ref 38–126)
Anion gap: 7 (ref 5–15)
BUN: 12 mg/dL (ref 8–23)
CO2: 27 mmol/L (ref 22–32)
Calcium: 8.6 mg/dL — ABNORMAL LOW (ref 8.9–10.3)
Chloride: 102 mmol/L (ref 98–111)
Creatinine, Ser: 0.89 mg/dL (ref 0.61–1.24)
GFR, Estimated: >60 mL/min (ref >60)
Glucose, Bld: 131 mg/dL — ABNORMAL HIGH (ref 70–99)
Potassium: 4.1 mmol/L (ref 3.5–5.1)
Sodium: 136 mmol/L (ref 135–145)
Total Bilirubin: 0.9 mg/dL (ref 0.3–1.2)
Total Protein: 6.1 g/dL — ABNORMAL LOW (ref 6.5–8.1)

## 2020-11-12 LAB — ECHOCARDIOGRAM COMPLETE
AR max vel: 2.91 cm2
AV Area VTI: 4.07 cm2
AV Area mean vel: 3.08 cm2
AV Mean grad: 3 mmHg
AV Peak grad: 6.9 mmHg
Ao pk vel: 1.31 m/s
Area-P 1/2: 3.65 cm2
Height: 68 in
S' Lateral: 2.87 cm
Weight: 4105.85 oz

## 2020-11-12 LAB — GLUCOSE, CAPILLARY
Glucose-Capillary: 146 mg/dL — ABNORMAL HIGH (ref 70–99)
Glucose-Capillary: 130 mg/dL — ABNORMAL HIGH (ref 70–99)
Glucose-Capillary: 114 mg/dL — ABNORMAL HIGH (ref 70–99)
Glucose-Capillary: 134 mg/dL — ABNORMAL HIGH (ref 70–99)

## 2020-11-12 LAB — TROPONIN I (HIGH SENSITIVITY): Troponin I (High Sensitivity): 38 ng/L — ABNORMAL HIGH (ref <18)

## 2020-11-12 NOTE — Progress Notes (Signed)
  Subjective: 3 Days Post-Op Procedure(s) (LRB): TOTAL HIP REVISION (Left) Patient reports pain as imrpoved from yesterday.   Plan is to go Skilled nursing facility after hospital stay. Negative for chest pain and shortness of breath Fever: no Gastrointestinal: negative for nausea and vomiting.   Patient has not had a bowel movement.  Objective: Vital signs in last 24 hours: Temp:  [98.2 F (36.8 C)-99.4 F (37.4 C)] 98.8 F (37.1 C) (02/21 0336) Pulse Rate:  [99-190] 101 (02/21 0336) Resp:  [16-22] 17 (02/21 0336) BP: (108-122)/(66-90) 117/73 (02/21 0336) SpO2:  [90 %-100 %] 100 % (02/21 0336)  Intake/Output from previous day:  Intake/Output Summary (Last 24 hours) at 11/12/2020 0710 Last data filed at 11/12/2020 0634 Gross per 24 hour  Intake 3733.55 ml  Output 1325 ml  Net 2408.55 ml    Intake/Output this shift: No intake/output data recorded.  Labs: Recent Labs    11/09/20 1400 11/10/20 0432 11/11/20 0843 11/12/20 0359  HGB 10.8* 9.6* 8.7* 7.9*   Recent Labs    11/11/20 0843 11/12/20 0359  WBC  --  11.9*  RBC  --  3.24*  HCT 24.7* 23.1*  PLT  --  278   Recent Labs    11/11/20 0843 11/12/20 0359  NA 136 136  K 3.3* 4.1  CL 101 102  CO2 25 27  BUN 10 12  CREATININE 0.87 0.89  GLUCOSE 145* 131*  CALCIUM 8.6* 8.6*   No results for input(s): LABPT, INR in the last 72 hours.   EXAM General - Patient is Alert, Appropriate and Oriented Extremity - Neurovascular intact Dorsiflexion/Plantar flexion intact Compartment soft Dressing/Incision -clean, dry, no drainage Motor Function - intact, moving foot and toes well on exam.  Cardiovascular- tachycardic rate, regular rhythm Respiratory- Lungs clear to auscultation bilaterally Gastrointestinal- soft, nontender and active bowel sounds   Assessment/Plan: 3 Days Post-Op Procedure(s) (LRB): TOTAL HIP REVISION (Left) Principal Problem:   Tachycardia with heart rate 121-140 beats per minute Active  Problems:   Type 2 diabetes mellitus with other specified complication (HCC)   Hypertension   Severe obesity (BMI 35.0-39.9) with comorbidity (HCC)   S/P revision of total hip  Estimated body mass index is 39.02 kg/m as calculated from the following:   Height as of this encounter: 5\' 8"  (1.727 m).   Weight as of this encounter: 116.4 kg. Advance diet Up with therapy  Post-surgical anemia -will continue to monitor -may consider transfusion depending on patient symptoms today  Tachycardia -continue metoprolol  DVT Prophylaxis - Lovenox Weight-Bearing as tolerated to left leg  Cassell Smiles, PA-C [facility] 11/12/2020, 7:10 AM

## 2020-11-12 NOTE — NC FL2 (Signed)
[place] LEVEL OF CARE SCREENING TOOL     IDENTIFICATION  Patient Name: Jesus Bell Birthdate: [birth year] Sex: male Admission Date (Current Location): 11/09/2020  [facility] and [place] Number:  Engineering geologist and Address:  [facility], [address], [place], [place] [address]      Provider Number: [id number]  Attending Physician Name and Address:  Dereck Leep, MD  Relative Name and Phone Number:  Wilber Fini [phone]    Current Level of Care: Hospital Recommended Level of Care: [place] Prior Approval Number:    Date Approved/Denied:   PASRR Number: [id number]  Discharge Plan: SNF    Current Diagnoses: Patient Active Problem List   Diagnosis Date Noted   Tachycardia with heart rate 121-140 beats per minute 11/11/2020   Fatty liver 11/09/2020   Hardening of the aorta (main artery of the heart) ([place]) 11/09/2020   Kidney stone 11/09/2020   Osteoarthritis of hip 11/09/2020   S/P revision of total hip 11/09/2020   Benign prostatic hyperplasia 09/01/2020   Duodenal ulcer 09/01/2020   ED (erectile dysfunction) of organic origin 09/01/2020   Gastro-esophageal reflux disease without esophagitis 09/01/2020   Pure hypercholesterolemia 09/01/2020   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   Chronic bilateral low back pain with left-sided sciatica 06/21/2020   Foraminal stenosis of lumbar region 06/21/2020   Right lower lobe lung mass 06/13/2020   Severe obesity (BMI 35.0-39.9) with comorbidity ([place]) 04/09/2020   Type 2 diabetes mellitus with other specified complication ([place]) 84/13/2440   Hypertension 08/17/2017   Status post total replacement of hip 08/17/2017    Orientation RESPIRATION BLADDER Height & Weight     Self,Time,Situation,Place  Normal Continent Weight: 116.4 kg Height:  5\' 8"  (172.7 cm)  BEHAVIORAL SYMPTOMS/MOOD NEUROLOGICAL BOWEL NUTRITION STATUS      Continent Diet  (Regular)  AMBULATORY STATUS COMMUNICATION OF NEEDS Skin   Extensive Assist Verbally Surgical wounds                       Personal Care Assistance Level of Assistance  Bathing,Dressing,Feeding Bathing Assistance: Limited assistance Feeding assistance: Independent Dressing Assistance: Limited assistance     Functional Limitations Info  Sight,Hearing,Speech Sight Info: Adequate Hearing Info: Adequate Speech Info: Adequate    SPECIAL CARE FACTORS FREQUENCY  PT (By licensed PT),OT (By licensed OT)                    Contractures Contractures Info: Not present    Additional Factors Info  Code Status,Allergies Code Status Info: Full Allergies Info: NSAIDS, Aspirin           Current Medications (11/12/2020):  This is the current hospital active medication list Current Facility-Administered Medications  Medication Dose Route Frequency Provider Last Rate Last Admin   0.9 %  sodium chloride infusion   Intravenous Continuous Hooten, Laurice Record, MD 100 mL/hr at 11/11/20 1500 New Bag at 11/11/20 1500   0.9 %  sodium chloride infusion   Intravenous Continuous Fausto Skillern, PA-C 100 mL/hr at 11/12/20 1027 Infusion Verify at 11/12/20 2536   acetaminophen (TYLENOL) tablet 325-650 mg  325-650 mg Oral Q6H PRN Hooten, Laurice Record, MD       alum & mag hydroxide-simeth (MAALOX/MYLANTA) 200-200-20 MG/5ML suspension 30 mL  30 mL Oral Q4H PRN Hooten, Laurice Record, MD       amLODipine (NORVASC) tablet 10 mg  10 mg Oral Q breakfast Hooten, Laurice Record, MD  10 mg at 11/12/20 7948   bisacodyl (DULCOLAX) suppository 10 mg  10 mg Rectal Daily PRN Hooten, Laurice Record, MD       cholecalciferol (VITAMIN D3) tablet 5,000 Units  5,000 Units Oral Daily Hooten, Laurice Record, MD   5,000 Units at 11/12/20 0900   diphenhydrAMINE (BENADRYL) 12.5 MG/5ML elixir 12.5-25 mg  12.5-25 mg Oral Q4H PRN Hooten, Laurice Record, MD       enoxaparin (LOVENOX) injection 40 mg  40 mg Subcutaneous Q24H Hooten, Laurice Record, MD   40 mg at  11/12/20 0845   ferrous sulfate tablet 325 mg  325 mg Oral BID WC Dereck Leep, MD   325 mg at 11/12/20 0836   HYDROmorphone (DILAUDID) injection 0.5-1 mg  0.5-1 mg Intravenous Q4H PRN Dereck Leep, MD   1 mg at 11/11/20 0251   insulin aspart (novoLOG) injection 0-15 Units  0-15 Units Subcutaneous TID WC Dereck Leep, MD   2 Units at 11/12/20 0165   magnesium hydroxide (MILK OF MAGNESIA) suspension 30 mL  30 mL Oral Daily Hooten, Laurice Record, MD   30 mL at 11/12/20 0900   menthol-cetylpyridinium (CEPACOL) lozenge 3 mg  1 lozenge Oral PRN Hooten, Laurice Record, MD       Or   phenol (CHLORASEPTIC) mouth spray 1 spray  1 spray Mouth/Throat PRN Hooten, Laurice Record, MD       metFORMIN (GLUCOPHAGE) tablet 1,000 mg  1,000 mg Oral BID PC Hooten, Laurice Record, MD   1,000 mg at 11/12/20 0835   metoprolol tartrate (LOPRESSOR) tablet 25 mg  25 mg Oral BID Tamala Julian B, PA-C   25 mg at 11/12/20 0900   ondansetron (ZOFRAN) tablet 4 mg  4 mg Oral Q6H PRN Hooten, Laurice Record, MD       Or   ondansetron (ZOFRAN) injection 4 mg  4 mg Intravenous Q6H PRN Hooten, Laurice Record, MD       oxyCODONE (Oxy IR/ROXICODONE) immediate release tablet 5-10 mg  5-10 mg Oral Q4H PRN Dereck Leep, MD   10 mg at 11/12/20 5374   pantoprazole (PROTONIX) EC tablet 40 mg  40 mg Oral BID Dereck Leep, MD   40 mg at 11/12/20 0900   pravastatin (PRAVACHOL) tablet 40 mg  40 mg Oral q1800 Dereck Leep, MD   40 mg at 11/11/20 1629   senna-docusate (Senokot-S) tablet 1 tablet  1 tablet Oral BID Dereck Leep, MD   1 tablet at 11/11/20 2032   sodium phosphate (FLEET) 7-19 GM/118ML enema 1 enema  1 enema Rectal Once PRN Hooten, Laurice Record, MD       traMADol Veatrice Bourbon) tablet 50-100 mg  50-100 mg Oral Q4H PRN Dereck Leep, MD   50 mg at 11/12/20 8270     Discharge Medications: Please see discharge summary for a list of discharge medications.  Relevant Imaging Results:  Relevant Lab Results:   Additional Information SS#  [id number]  Shelbie Ammons, RN

## 2020-11-12 NOTE — Progress Notes (Signed)
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  Patient's initial arthroplasty was done 3 years ago and he had been seen by orthopedic surgery for new onset pain in the groin and anterior thigh without any trauma or aggravating event.  Due to worsening of the pain he initially was treated with corticosteroid injection without any improvement and decision was made to revise the initial surgery.   Medical consult was requested because patient was said to have 2 episodes of tachycardia overnight with heart rate as high as 190 bpm.  2/21-tele with sinus tachy with max rate 105. PVC's presents  Subjective: Pt sleeping , answer questions, denies cp, dizziness, sob  Objective: Vitals:   11/12/20 0002 11/12/20 0336 11/12/20 0803 11/12/20 1210  BP: 108/72 117/73 127/83 124/78  Pulse: (!) 107 (!) 101 (!) 109 96  Resp: 18 17 18 16   Temp: 98.2 F (36.8 C) 98.8 F (37.1 C) 98.6 F (37 C) 98.8 F (37.1 C)  TempSrc:   Oral Oral  SpO2: 100% 100% 100% 98%  Weight:      Height:        Intake/Output Summary (Last 24 hours) at 11/12/2020 1301 Last data filed at 11/12/2020 0801 Gross per 24 hour  Intake 3613.55 ml  Output 1725 ml  Net 1888.55 ml   Filed Weights   11/09/20 0628 11/09/20 2009  Weight: 111.1 kg 116.4 kg    Examination:  General exam: Appears calm and comfortable  Respiratory system: Clear to auscultation. Respiratory effort normal. Cardiovascular system: S1 & S2 heard, RRR. No JVD, murmurs, rubs, gallops or clicks. Gastrointestinal system: Abdomen is nondistended, soft and nontender.. Normal bowel sounds heard. Central nervous system: grossly intact Extremities: no edema Psychiatry:  Mood & affect appropriate.      Data Reviewed: I have personally reviewed following labs and imaging studies  CBC: Recent Labs  Lab 11/09/20 1400 11/10/20 0432 11/11/20 0843 11/12/20 0359  WBC  --   --   --  11.9*  HGB 10.8* 9.6* 8.7* 7.9*  HCT 31.5* 28.3* 24.7* 23.1*  MCV  --   --   --  71.3*  PLT  --   --   --  938   Basic Metabolic Panel: Recent Labs  Lab 11/11/20 0843 11/12/20 0359  NA 136 136  K 3.3* 4.1  CL 101 102  CO2 25 27  GLUCOSE 145* 131*  BUN 10 12  CREATININE 0.87 0.89  CALCIUM 8.6* 8.6*   GFR: Estimated Creatinine Clearance: 103.9 mL/min (by C-G formula based on SCr of 0.89 mg/dL). Liver Function Tests: Recent Labs  Lab 11/12/20 0359  AST 32  ALT 9  ALKPHOS 67  BILITOT 0.9  PROT 6.1*  ALBUMIN 2.9*   No results for input(s): LIPASE, AMYLASE in the last 168 hours. No results for input(s): AMMONIA in the last 168 hours. Coagulation Profile: No results for input(s): INR, PROTIME in the last 168 hours. Cardiac Enzymes: No results for input(s): CKTOTAL, CKMB, CKMBINDEX, TROPONINI in the last 168 hours. BNP (last 3 results) No results for input(s): PROBNP in the last 8760 hours. HbA1C: No results for input(s): HGBA1C in the last 72 hours. CBG: Recent Labs  Lab 11/11/20 1132 11/11/20 1623 11/11/20 2126 11/12/20 0759 11/12/20  [place]   Lipid Profile: No results for input(s): CHOL, HDL, LDLCALC, TRIG, CHOLHDL, LDLDIRECT in the last 72 hours. Thyroid Function Tests: No results for input(s): TSH, T4TOTAL, FREET4, T3FREE, THYROIDAB in the last 72 hours. Anemia Panel: No results for input(s): VITAMINB12, FOLATE, FERRITIN, TIBC, IRON, RETICCTPCT in the last 72 hours. Sepsis Labs: No results for input(s): PROCALCITON, LATICACIDVEN in the last 168 hours.  Recent Results (from the past 240 hour(s))  SARS CORONAVIRUS 2 (TAT 6-24 HRS) Nasopharyngeal Nasopharyngeal Swab     Status: None   Collection Time: 11/07/20 10:59 AM   Specimen:  Nasopharyngeal Swab  Result Value Ref Range Status   SARS Coronavirus 2 NEGATIVE NEGATIVE Final    Comment: (NOTE) SARS-CoV-2 target nucleic acids are NOT DETECTED.  The SARS-CoV-2 RNA is generally detectable in upper and lower respiratory specimens during the acute phase of infection. Negative results do not preclude SARS-CoV-2 infection, do not rule out co-infections with other pathogens, and should not be used as the sole basis for treatment or other patient management decisions. Negative results must be combined with clinical observations, patient history, and epidemiological information. The expected result is Negative.  Fact Sheet for Patients: SugarRoll.be  Fact Sheet for Healthcare Providers: https://www.woods-mathews.com/  This test is not yet approved or cleared by the [place] FDA and  has been authorized for detection and/or diagnosis of SARS-CoV-2 by FDA under an Emergency Use Authorization (EUA). This EUA will remain  in effect (meaning this test can be used) for the duration of the COVID-19 declaration under Se ction 564(b)(1) of the Act, 21 U.S.C. section 360bbb-3(b)(1), unless the authorization is terminated or revoked sooner.  Performed at [facility], [place] [address], [place], [place] [address]   Aerobic/Anaerobic Culture w Gram Stain (surgical/deep wound)     Status: None (Preliminary result)   Collection Time: 11/09/20  9:29 AM   Specimen: PATH Other; Tissue  Result Value Ref Range Status   Specimen Description   Final    WOUND Performed at [facility], [address], [place], [place] [address]    Special Requests   Final    LEFT HIP Performed at [facility], [address] [place], [place] [address]    Gram Stain   Final    FEW WBC SEEN NO ORGANISMS SEEN Performed at [facility], [address] [place], [place] [address]    Culture   Final    NO GROWTH 3 DAYS  NO ANAEROBES ISOLATED; CULTURE IN PROGRESS FOR 5 DAYS Performed at [facility] [address], [place], [place] [address]    Report Status PENDING  Incomplete         Radiology Studies: CT ANGIO CHEST PE W OR WO CONTRAST  Result Date: 11/11/2020 CLINICAL DATA:  Recent hip surgery with increased heart rate and dizziness. EXAM: CT ANGIOGRAPHY CHEST WITH CONTRAST TECHNIQUE: Multidetector CT imaging of the chest was performed using the standard protocol during bolus administration of intravenous contrast. Multiplanar CT image reconstructions and MIPs were obtained to evaluate the vascular anatomy. CONTRAST:  166mL OMNIPAQUE IOHEXOL 350 MG/ML SOLN COMPARISON:  05/23/2020. FINDINGS: Cardiovascular: The heart size is normal. No substantial pericardial effusion. No thoracic aortic aneurysm. There is no filling defect within the opacified pulmonary arteries to suggest the presence of an acute pulmonary embolus. Mediastinum/Nodes: No mediastinal lymphadenopathy. There is no hilar lymphadenopathy. The esophagus has normal imaging features. There is no axillary lymphadenopathy.  Stable 3.2 cm right thyroid nodule. Lungs/Pleura: 4 mm right perifissural nodule on image 41/6 is unchanged in the interval. 5 mm right middle lobe nodule on 47/6 is stable. Multi lobular lesion posterior right lower lobe measured previously at 2.0 x 1.8 cm measures 2.1 x 1.3 cm today (image 60/series 6) but is differentially position due to adjacent atelectasis. Chronic atelectasis noted medial left lower lobe scattered tiny left upper lobe pulmonary nodules are stable Upper Abdomen: The liver shows diffusely decreased attenuation suggesting fat deposition. Subcapsular fatty sparing noted along the gallbladder fossa. Musculoskeletal: No worrisome lytic or sclerotic osseous abnormality. Review of the MIP images confirms the above findings. IMPRESSION: 1. No CT evidence for acute pulmonary embolus. 2. Stable bilateral pulmonary  nodules including 2.1 cm posterior right lower lobe nodule evaluated by PET-CT on 06/12/2020. 3. Hepatic steatosis. 4. 3.2 cm right thyroid nodule, stable since 05/23/2020. Recommend thyroid US (ref: J Am Coll Radiol. 2015 Feb;12(2): 143-50). Electronically Signed   By: Misty Stanley M.D.   On: 11/11/2020 13:20   DG CHEST PORT 1 VIEW  Result Date: 11/11/2020 CLINICAL DATA:  Tachycardia. EXAM: PORTABLE CHEST 1 VIEW COMPARISON:  None. FINDINGS: 1002 hours. Low lung volumes. The cardio pericardial silhouette is enlarged. There is pulmonary vascular congestion without overt pulmonary edema. No edema or focal airspace consolidation. No substantial pleural effusion. The visualized bony structures of the thorax show no acute abnormality. IMPRESSION: Low lung volumes with vascular congestion. Electronically Signed   By: Misty Stanley M.D.   On: 11/11/2020 10:27   ECHOCARDIOGRAM COMPLETE  Result Date: 11/12/2020    ECHOCARDIOGRAM REPORT   Patient Name:   CHIDERA THIVIERGE Holstine Date of Exam: 11/12/2020 Medical Rec #:  [id number]      Height:       68.0 in Accession #:    [id number]     Weight:       256.6 lb Date of Birth:  08-25-1956      BSA:          2.272 m Patient Age:    64 years       BP:           127/83 mmHg Patient Gender: M              HR:           109 bpm. Exam Location:  [facility] Procedure: 2D Echo, Color Doppler and Cardiac Doppler Indications:     SBE 421.0 / I28.8  History:         Patient has no prior history of Echocardiogram examinations.                  Stroke; Risk Factors:Hypertension and Diabetes.  Sonographer:     Sherrie Sport RDCS (AE) Referring Phys:  [id number] Collier Bullock Diagnosing Phys: Kathlyn Sacramento MD  Sonographer Comments: Suboptimal apical window and no subcostal window. IMPRESSIONS  1. Left ventricular ejection fraction, by estimation, is 60 to 65%. The left ventricle has normal function. The left ventricle has no regional wall motion abnormalities. There is mild left ventricular hypertrophy.  Left ventricular diastolic parameters are consistent with Grade I diastolic dysfunction (impaired relaxation).  2. Right ventricular systolic function is normal. The right ventricular size is normal. Tricuspid regurgitation signal is inadequate for assessing PA pressure.  3. The mitral valve is normal in structure. No evidence of mitral valve regurgitation. No evidence of mitral stenosis.  4. The aortic valve is normal in structure. Aortic valve regurgitation  is not visualized. Mild aortic valve sclerosis is present, with no evidence of aortic valve stenosis. Conclusion(s)/Recommendation(s): No evidence of valvular vegetations on this transthoracic echocardiogram. No intracardiac source of embolism detected on this transthoracic study. FINDINGS  Left Ventricle: Left ventricular ejection fraction, by estimation, is 60 to 65%. The left ventricle has normal function. The left ventricle has no regional wall motion abnormalities. The left ventricular internal cavity size was normal in size. There is  mild left ventricular hypertrophy. Left ventricular diastolic parameters are consistent with Grade I diastolic dysfunction (impaired relaxation). Right Ventricle: The right ventricular size is normal. No increase in right ventricular wall thickness. Right ventricular systolic function is normal. Tricuspid regurgitation signal is inadequate for assessing PA pressure. Left Atrium: Left atrial size was normal in size. Right Atrium: Right atrial size was normal in size. Pericardium: There is no evidence of pericardial effusion. Mitral Valve: The mitral valve is normal in structure. No evidence of mitral valve regurgitation. No evidence of mitral valve stenosis. Tricuspid Valve: The tricuspid valve is normal in structure. Tricuspid valve regurgitation is not demonstrated. No evidence of tricuspid stenosis. Aortic Valve: The aortic valve is normal in structure. Aortic valve regurgitation is not visualized. Mild aortic valve  sclerosis is present, with no evidence of aortic valve stenosis. Aortic valve mean gradient measures 3.0 mmHg. Aortic valve peak gradient measures 6.9 mmHg. Aortic valve area, by VTI measures 4.07 cm. Pulmonic Valve: The pulmonic valve was normal in structure. Pulmonic valve regurgitation is not visualized. No evidence of pulmonic stenosis. Aorta: The aortic root is normal in size and structure. Venous: The inferior vena cava was not well visualized. IAS/Shunts: No atrial level shunt detected by color flow Doppler.  LEFT VENTRICLE PLAX 2D LVIDd:         4.87 cm  Diastology LVIDs:         2.87 cm  LV e' medial:    5.55 cm/s LV PW:         1.27 cm  LV E/e' medial:  11.6 LV IVS:        1.24 cm  LV e' lateral:   6.42 cm/s LVOT diam:     2.10 cm  LV E/e' lateral: 10.0 LV SV:         68 LV SV Index:   30 LVOT Area:     3.46 cm  RIGHT VENTRICLE RV Basal diam:  2.55 cm RV S prime:     19.90 cm/s TAPSE (M-mode): 4.3 cm LEFT ATRIUM           Index       RIGHT ATRIUM           Index LA diam:      3.60 cm 1.58 cm/m  RA Area:     12.60 cm LA Vol (A2C): 23.2 ml 10.21 ml/m RA Volume:   27.60 ml  12.15 ml/m LA Vol (A4C): 35.0 ml 15.40 ml/m  AORTIC VALVE                   PULMONIC VALVE AV Area (Vmax):    2.91 cm    PV Vmax:        0.39 m/s AV Area (Vmean):   3.08 cm    PV Peak grad:   0.6 mmHg AV Area (VTI):     4.07 cm    RVOT Peak grad: 2 mmHg AV Vmax:           131.00 cm/s AV Vmean:  82.200 cm/s AV VTI:            0.167 m AV Peak Grad:      6.9 mmHg AV Mean Grad:      3.0 mmHg LVOT Vmax:         110.00 cm/s LVOT Vmean:        73.200 cm/s LVOT VTI:          0.196 m LVOT/AV VTI ratio: 1.17  AORTA Ao Root diam: 3.47 cm MITRAL VALVE               TRICUSPID VALVE MV Area (PHT): 3.65 cm    TR Peak grad:   18.0 mmHg MV Decel Time: 208 msec    TR Vmax:        212.00 cm/s MV E velocity: 64.30 cm/s MV A velocity: 93.40 cm/s  SHUNTS MV E/A ratio:  0.69        Systemic VTI:  0.20 m                            Systemic  Diam: 2.10 cm Kathlyn Sacramento MD Electronically signed by Kathlyn Sacramento MD Signature Date/Time: 11/12/2020/10:52:08 AM    Final         Scheduled Meds: . amLODipine  10 mg Oral Q breakfast  . cholecalciferol  5,000 Units Oral Daily  . enoxaparin (LOVENOX) injection  40 mg Subcutaneous Q24H  . ferrous sulfate  325 mg Oral BID WC  . insulin aspart  0-15 Units Subcutaneous TID WC  . magnesium hydroxide  30 mL Oral Daily  . metFORMIN  1,000 mg Oral BID PC  . metoprolol tartrate  25 mg Oral BID  . pantoprazole  40 mg Oral BID  . pravastatin  40 mg Oral q1800  . senna-docusate  1 tablet Oral BID   Continuous Infusions: . sodium chloride 100 mL/hr at 11/11/20 1500  . sodium chloride 100 mL/hr at 11/12/20 1125    Assessment & Plan:   Principal Problem:   Tachycardia with heart rate 121-140 beats per minute Active Problems:   Type 2 diabetes mellitus with other specified complication (HCC)   Hypertension   Severe obesity (BMI 35.0-39.9) with comorbidity ([place])   S/P revision of total hip   1. Sinus tachycardia Medical consult requested for sinus tachycardia.  Patient is status post revision left total hip arthroplasty which was done on 11/09/20 and over the last 24 hours has had 2 episodes of documented tachycardia with heart rates as high as 190 bpm.  2/21-asx. Tele with HR better controlled.  Likely 2/2 pain.  As per nursing patient was in really asking for pain meds and telling anyone he was having pain Echo with nml EF CTA chest negative for PE. Monitor H&H, hemoglobin 7.9 transfuse if hemoglobin less than 7    2.  Diabetes mellitus bg levels stable. Continue RISS    3.  Hypokalemia Replaced and stable   4.  Hypertension Stable Continue amlodipine and metoprolol  5.thyroid nodule-found on CT scan. Stable since 9/21.  F/u with pcp as outpt for thyroid US.  DVT prophylaxis: Lovenox  Disposition Plan: SNF Per PT recommendation            LOS: 3  days   Time spent: 35 min with >50% on coc   Nolberto Hanlon, MD [facility] Pager [phone]  If 7PM-7AM, please contact night-coverage 11/12/2020, 1:01 PM

## 2020-11-12 NOTE — Progress Notes (Signed)
*  PRELIMINARY RESULTS* Echocardiogram 2D Echocardiogram has been performed.  Sherrie Sport 11/12/2020, 10:18 AM

## 2020-11-12 NOTE — Progress Notes (Signed)
Physical Therapy Treatment Patient Details Name: Jesus Bell MRN: [id number] DOB: 1956/03/23 Today's Date: 11/12/2020    History of Present Illness Pt admitted to [facility] on 11/09/20 for elective L THA revision. He is 44yr s/p L THA. Pt is WBAT on LLE and with posterior hip precautions including: no hip flexion >90deg, no hip IR, and no hip ADD. Significant PMH includes: stroke.    PT Comments    Pt received Echocardiogram this am due to HR concerns that seem to be resolved per nursing.  Pt cleared for mobility.  Pt required CGA/MinA to transition to EOB, sitting x 4 minutes to adjust to position, No c/o dizziness.  Sit to stand with MinA from raised bed.  Pt able to advance to bedside recliner with RW, flexed forward position, and CGA for safety.  5/10 L Hip pain with wt bearing activity. Pt completed seated B LE strengthening exercises.  Will return this pm to increase gait distance.  Continue to recommend short term rehab placement upon d/c.   Follow Up Recommendations  SNF     Equipment Recommendations  None recommended by PT    Recommendations for Other Services Rehab consult     Precautions / Restrictions Precautions Precautions: Posterior Hip;Fall Precaution Booklet Issued: Yes (comment) Restrictions Weight Bearing Restrictions: Yes LLE Weight Bearing: Weight bearing as tolerated    Mobility  Bed Mobility Overal bed mobility: Needs Assistance Bed Mobility: Rolling;Sit to Supine Rolling: Min guard Sidelying to sit: Min assist;HOB elevated            Transfers Overall transfer level: Needs assistance Equipment used: Rolling walker (2 wheeled) Transfers: Sit to/from Stand Sit to Stand: Min assist;From elevated surface            Ambulation/Gait Ambulation/Gait assistance: Min guard Gait Distance (Feet): 3 Feet Assistive device: Rolling walker (2 wheeled) Gait Pattern/deviations: Step-to pattern;Shuffle;Decreased weight shift to left Gait velocity:  decreased   General Gait Details:  (Flexed forward posture)   Stairs             Wheelchair Mobility    Modified Rankin (Stroke Patients Only)       Balance                                            Cognition Arousal/Alertness: Awake/alert Behavior During Therapy: WFL for tasks assessed/performed Overall Cognitive Status: Within Functional Limits for tasks assessed                                 General Comments: Pt able to recall 3/3 posterior hip precautions and WB precautions without cues      Exercises Total Joint Exercises Ankle Circles/Pumps: AROM;Strengthening;Both;10 reps;Seated Long Arc Quad: Both;10 reps;Seated    General Comments General comments (skin integrity, edema, etc.):  (Pt had 2D Echocardiogram this am, cleared by nursing to participate.)      Pertinent Vitals/Pain Pain Assessment: 0-10 Pain Score: 5  Pain Location: L hip Pain Descriptors / Indicators: Discomfort;Dull;Grimacing;Operative site guarding Pain Intervention(s): Monitored during session    Home Living                      Prior Function            PT Goals (current goals can now be found in the care  plan section) Acute Rehab PT Goals Patient Stated Goal: To return to PLOF and go fishing    Frequency    BID      PT Plan      Co-evaluation              AM-PAC PT "6 Clicks" Mobility   Outcome Measure  Help needed turning from your back to your side while in a flat bed without using bedrails?: A Little Help needed moving from lying on your back to sitting on the side of a flat bed without using bedrails?: A Little Help needed moving to and from a bed to a chair (including a wheelchair)?: A Little Help needed standing up from a chair using your arms (e.g., wheelchair or bedside chair)?: A Little Help needed to walk in hospital room?: A Little Help needed climbing 3-5 steps with a railing? : A Lot 6 Click Score:  17    End of Session Equipment Utilized During Treatment: Gait belt Activity Tolerance: Patient tolerated treatment well;Patient limited by fatigue;Patient limited by pain Patient left: in chair;with call bell/phone within reach Nurse Communication: Mobility status;Patient requests pain meds PT Visit Diagnosis: Unsteadiness on feet (R26.81);Muscle weakness (generalized) (M62.81);Difficulty in walking, not elsewhere classified (R26.2);Pain Pain - Right/Left: Left Pain - part of body: Hip     Time: 1110-1200 PT Time Calculation (min) (ACUTE ONLY): 50 min  Charges:  $Therapeutic Exercise: 8-22 mins $Therapeutic Activity: 23-37 mins                     Jesus Bell, PTA    Jesus Bell 11/12/2020, 12:41 PM

## 2020-11-12 NOTE — TOC Progression Note (Signed)
Transition of Care [facility]) - Progression Note    Patient Details  Name: Jesus Bell MRN: [id number] Date of Birth: 01/29/1956  Transition of Care [facility]) CM/SW [place], RN Phone Number: 11/12/2020, 2:01 PM  Clinical Narrative:   RNCM met with patient in room to discuss bed offerings of [facility], [place]. Medicare.gov ratings of facilities was provided. Patient chooses [facility]. RNCM accepted bed in hub and will start insurance authorization through Navi portal.     Expected Discharge Plan: [place] Barriers to Discharge: No Barriers Identified  Expected Discharge Plan and Services Expected Discharge Plan: [place] arrangements for the past 2 months: [place]                                       Social Determinants of Health (SDOH) Interventions    Readmission Risk Interventions No flowsheet data found.

## 2020-11-12 NOTE — Progress Notes (Signed)
Physical Therapy Treatment Patient Details Name: Jesus Bell MRN: [id number] DOB: Sep 17, 1956 Today's Date: 11/12/2020    History of Present Illness Pt admitted to [facility] on 11/09/20 for elective L THA revision. He is 84yr s/p L THA. Pt is WBAT on LLE and with posterior hip precautions including: no hip flexion >90deg, no hip IR, and no hip ADD. Significant PMH includes: stroke.    PT Comments    Increased time needed to assist pt back to bed after sitting upr for ~ 2 hours.  Pt unable to raise self out of recliner, requiring MaxA after 3 attempts.  Pt stood at [facility], repeated vc's to maintain upright posture and not lean with forearms on RW hand grips.  Pt c/o 10/10 pain L hip, unable to pick L LE up off floor to produce a step.  Pt educated on not twisting or breaking precautions with good understanding.  Pt required [place] for B LE management during sit to supine.  Pt with poor tolerance for pain this pm. Pt will benefit from SNF placement once medically stable due to remaining weakness, pain , and inability to safely mobilize self.   Follow Up Recommendations  SNF     Equipment Recommendations  None recommended by PT    Recommendations for Other Services Rehab consult     Precautions / Restrictions Precautions Precautions: Posterior Hip;Fall Precaution Booklet Issued: Yes (comment) Restrictions Weight Bearing Restrictions: Yes LLE Weight Bearing: Weight bearing as tolerated    Mobility  Bed Mobility Overal bed mobility: Needs Assistance Bed Mobility: Rolling;Sit to Supine Rolling: Min guard Sidelying to sit: Min assist;HOB elevated   Sit to supine: Mod assist   General bed mobility comments: Mod assist for BLE facilitation onto bed. CGA for rolling    Transfers Overall transfer level: Needs assistance Equipment used: Rolling walker (2 wheeled) Transfers: Sit to/from Stand Sit to Stand: Max assist (from recliner chair)            Ambulation/Gait Ambulation/Gait  assistance: Min guard Gait Distance (Feet): 4 Feet Assistive device: Rolling walker (2 wheeled) Gait Pattern/deviations: Step-to pattern;Shuffle;Decreased weight shift to left Gait velocity: decreased   General Gait Details: Min assist to ambulate 2ft with RW from recliner>EOB. Pt demonstrates forward flexed posture with increased UE support through forearm on RW, decreased step length bil, and decreased L foot clearance during LLE facilitation.   Stairs             Wheelchair Mobility    Modified Rankin (Stroke Patients Only)       Balance                                            Cognition Arousal/Alertness: Awake/alert Behavior During Therapy: WFL for tasks assessed/performed Overall Cognitive Status: Within Functional Limits for tasks assessed                                 General Comments: Pt able to recall 3/3 posterior hip precautions and WB precautions without cues      Exercises Total Joint Exercises Ankle Circles/Pumps: AROM;Strengthening;Both;10 reps;Seated Long Arc Quad: Both;10 reps;Seated    General Comments General comments (skin integrity, edema, etc.):  (Pt had 2D Echocardiogram this am, cleared by nursing to participate.)      Pertinent Vitals/Pain Pain Assessment: 0-10 Pain Score:  10-Worst pain ever Pain Location: L hip Pain Descriptors / Indicators: Jabbing;Shooting;Sore;Throbbing;Grimacing Pain Intervention(s): Limited activity within patient's tolerance    Home Living                      Prior Function            PT Goals (current goals can now be found in the care plan section) Acute Rehab PT Goals Patient Stated Goal: To return to PLOF and go fishing    Frequency    BID      PT Plan      Co-evaluation              AM-PAC PT "6 Clicks" Mobility   Outcome Measure  Help needed turning from your back to your side while in a flat bed without using bedrails?: A  Little Help needed moving from lying on your back to sitting on the side of a flat bed without using bedrails?: A Little Help needed moving to and from a bed to a chair (including a wheelchair)?: A Little Help needed standing up from a chair using your arms (e.g., wheelchair or bedside chair)?: A Little Help needed to walk in hospital room?: A Little Help needed climbing 3-5 steps with a railing? : A Lot 6 Click Score: 17    End of Session Equipment Utilized During Treatment: Gait belt Activity Tolerance: Patient limited by pain Patient left: in bed;with call bell/phone within reach;with nursing/sitter in room Nurse Communication: Mobility status;Patient requests pain meds PT Visit Diagnosis: Unsteadiness on feet (R26.81);Muscle weakness (generalized) (M62.81);Difficulty in walking, not elsewhere classified (R26.2);Pain Pain - Right/Left: Left Pain - part of body: Hip     Time: 9381-0175 PT Time Calculation (min) (ACUTE ONLY): 38 min  Charges:  $Therapeutic Exercise: 8-22 mins $Therapeutic Activity: 38-52 mins                     Mikel Cella, PTA    [name] 11/12/2020, 3:39 PM

## 2020-11-12 NOTE — TOC Initial Note (Signed)
Transition of Care [facility]) - Initial/Assessment Note    Patient Details  Name: Jesus Bell MRN: [id number] Date of Birth: 09-27-1955  Transition of Care [facility]) CM/SW Contact:    Shelbie Ammons, RN Phone Number: 11/12/2020, 9:21 AM  Clinical Narrative:  RNCM met with patient in room. Patient is awake and alert and reports to feeling better this morning. Discussed reason for visit and discharge planning. Patient reports that he is agreeable to recommendations for SNF at discharge and reports that he does not have any initial places in mind. He reports that he has been to [facility] in the past but understands they are no longer open to the public.  RNCM verified PASSR, completed FL2 and started bed search.                  Expected Discharge Plan: Skilled Nursing Facility Barriers to Discharge: No Barriers Identified   Patient Goals and CMS Choice        Expected Discharge Plan and Services Expected Discharge Plan: [place]       Living arrangements for the past 2 months: [place]                                      Prior Living Arrangements/Services Living arrangements for the past 2 months: [place] Lives with:: Spouse Patient language and need for interpreter reviewed:: Yes Do you feel safe going back to the place where you live?: Yes      Need for Family Participation in Patient Care: Yes (Comment) Care giver support system in place?: Yes (comment)   Criminal Activity/Legal Involvement Pertinent to Current Situation/Hospitalization: No - Comment as needed  Activities of Daily Living Home Assistive Devices/Equipment: Walker (specify type) ADL Screening (condition at time of admission) Patient's cognitive ability adequate to safely complete daily activities?: Yes Is the patient deaf or have difficulty hearing?: No Does the patient have difficulty seeing, even when wearing glasses/contacts?: No Does the  patient have difficulty concentrating, remembering, or making decisions?: No Patient able to express need for assistance with ADLs?: Yes Does the patient have difficulty dressing or bathing?: No Independently performs ADLs?: Yes (appropriate for developmental age) Does the patient have difficulty walking or climbing stairs?: No Weakness of Legs: None Weakness of Arms/Hands: None  Permission Sought/Granted                  Emotional Assessment Appearance:: Appears stated age Attitude/Demeanor/Rapport: Engaged Affect (typically observed): Appropriate,Calm Orientation: : Oriented to Self,Oriented to Place,Oriented to  Time,Oriented to Situation Alcohol / Substance Use: Not Applicable Psych Involvement: No (comment)  Admission diagnosis:  S/P revision of total hip [Z96.649] Patient Active Problem List   Diagnosis Date Noted  . Tachycardia with heart rate 121-140 beats per minute 11/11/2020  . Fatty liver 11/09/2020  . Hardening of the aorta (main artery of the heart) ([place]) 11/09/2020  . Kidney stone 11/09/2020  . Osteoarthritis of hip 11/09/2020  . S/P revision of total hip 11/09/2020  . Benign prostatic hyperplasia 09/01/2020  . Duodenal ulcer 09/01/2020  . ED (erectile dysfunction) of organic origin 09/01/2020  . Gastro-esophageal reflux disease without esophagitis 09/01/2020  . Pure hypercholesterolemia 09/01/2020  . 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 Chronic bilateral low back pain with left-sided sciatica 06/21/2020  . Foraminal stenosis of lumbar region 06/21/2020  . Right lower lobe lung mass 06/13/2020  .  Severe obesity (BMI 35.0-39.9) with comorbidity ([place]) 04/09/2020  . Type 2 diabetes mellitus with other specified complication ([place]) 48/14/4392  . Hypertension 08/17/2017  . Status post total replacement of hip 08/17/2017   PCP:  Wenda Low, MD Pharmacy:   [facility] [address] [place] - [place] [address] [place]  [address] Phone: [phone] Fax: [phone]     Social Determinants of Health (SDOH) Interventions    Readmission Risk Interventions No flowsheet data found.

## 2020-11-13 ENCOUNTER — Encounter: Payer: Self-pay | Admitting: Orthopedic Surgery

## 2020-11-13 DIAGNOSIS — R000 Tachycardia, unspecified: Secondary | ICD-10-CM | POA: Diagnosis not present

## 2020-11-13 LAB — CBC WITH DIFFERENTIAL/PLATELET
Abs Immature Granulocytes: 0.08 10*3/uL — ABNORMAL HIGH (ref 0.00–0.07)
Basophils Absolute: 0 10*3/uL (ref 0.0–0.1)
Basophils Relative: 0 %
Eosinophils Absolute: 0.1 10*3/uL (ref 0.0–0.5)
Eosinophils Relative: 1 %
HCT: 23.9 % — ABNORMAL LOW (ref 39.0–52.0)
Hemoglobin: 8.2 g/dL — ABNORMAL LOW (ref 13.0–17.0)
Immature Granulocytes: 1 %
Lymphocytes Relative: 8 %
Lymphs Abs: 0.8 10*3/uL (ref 0.7–4.0)
MCH: 24.3 pg — ABNORMAL LOW (ref 26.0–34.0)
MCHC: 34.3 g/dL (ref 30.0–36.0)
MCV: 70.9 fL — ABNORMAL LOW (ref 80.0–100.0)
Monocytes Absolute: 1.2 10*3/uL — ABNORMAL HIGH (ref 0.1–1.0)
Monocytes Relative: 12 %
Neutro Abs: 8 10*3/uL — ABNORMAL HIGH (ref 1.7–7.7)
Neutrophils Relative %: 78 %
Platelets: 322 10*3/uL (ref 150–400)
RBC: 3.37 MIL/uL — ABNORMAL LOW (ref 4.22–5.81)
RDW: 16.7 % — ABNORMAL HIGH (ref 11.5–15.5)
WBC: 10.3 10*3/uL (ref 4.0–10.5)
nRBC: 0 % (ref 0.0–0.2)

## 2020-11-13 LAB — COMPREHENSIVE METABOLIC PANEL WITH GFR
ALT: 15 U/L (ref 0–44)
AST: 31 U/L (ref 15–41)
Albumin: 3 g/dL — ABNORMAL LOW (ref 3.5–5.0)
Alkaline Phosphatase: 68 U/L (ref 38–126)
Anion gap: 9 (ref 5–15)
BUN: 10 mg/dL (ref 8–23)
CO2: 26 mmol/L (ref 22–32)
Calcium: 8.8 mg/dL — ABNORMAL LOW (ref 8.9–10.3)
Chloride: 101 mmol/L (ref 98–111)
Creatinine, Ser: 0.87 mg/dL (ref 0.61–1.24)
GFR, Estimated: >60 mL/min
Glucose, Bld: 133 mg/dL — ABNORMAL HIGH (ref 70–99)
Potassium: 3.6 mmol/L (ref 3.5–5.1)
Sodium: 136 mmol/L (ref 135–145)
Total Bilirubin: 0.8 mg/dL (ref 0.3–1.2)
Total Protein: 6.3 g/dL — ABNORMAL LOW (ref 6.5–8.1)

## 2020-11-13 LAB — GLUCOSE, CAPILLARY
Glucose-Capillary: 130 mg/dL — ABNORMAL HIGH (ref 70–99)
Glucose-Capillary: 148 mg/dL — ABNORMAL HIGH (ref 70–99)
Glucose-Capillary: 119 mg/dL — ABNORMAL HIGH (ref 70–99)
Glucose-Capillary: 144 mg/dL — ABNORMAL HIGH (ref 70–99)

## 2020-11-13 LAB — HIV ANTIBODY (ROUTINE TESTING W REFLEX): HIV Screen 4th Generation wRfx: NONREACTIVE

## 2020-11-13 LAB — THYROID PANEL WITH TSH
Free Thyroxine Index: 1.6 (ref 1.2–4.9)
T3 Uptake Ratio: 27 % (ref 24–39)
T4, Total: 5.9 ug/dL (ref 4.5–12.0)
TSH: 0.771 u[IU]/mL (ref 0.450–4.500)

## 2020-11-13 LAB — SARS CORONAVIRUS 2 (TAT 6-24 HRS): SARS Coronavirus 2: POSITIVE — AB

## 2020-11-13 MED ORDER — SODIUM CHLORIDE 0.9 % IV SOLN
200.0000 mg | Freq: Once | INTRAVENOUS | Status: AC
  Administered 2020-11-13: 200 mg via INTRAVENOUS
  Filled 2020-11-13: qty 200

## 2020-11-13 MED ORDER — SODIUM CHLORIDE 0.9 % IV SOLN
100.0000 mg | Freq: Every day | INTRAVENOUS | Status: DC
  Filled 2020-11-13 (×2): qty 20

## 2020-11-13 MED ORDER — LACTULOSE 10 GM/15ML PO SOLN: 10.0000 g | Freq: Every day | ORAL | Status: DC | PRN

## 2020-11-13 NOTE — Care Management Important Message (Signed)
Important Message  Patient Details  Name: Jesus Bell MRN: [id number] Date of Birth: [birth year]   Medicare Important Message Given:  Yes  Patient is in a isolation room so I talked with him by phone [phone]) and reviewed the Important Message from Medicare. Said he was in agreement with discharge.  I asked if he would like a copy of the form and he asked me to send to his home.  I verified his home address and will send certified mail.   Juliann Pulse A Allmond 11/13/2020, 10:23 AM

## 2020-11-13 NOTE — Progress Notes (Signed)
Date and time results received: 11/13/20  0226   Test: Linus Orn coronavirus 2 Critical Value: Positive  Name of Provider Notified: Damita Dunnings MD Orders Received? Or Actions Taken?: Airborne/contact isolation initiated

## 2020-11-13 NOTE — Progress Notes (Signed)
Physical Therapy Treatment Patient Details Name: Jesus Bell MRN: [id number] DOB: 11-14-1955 Today's Date: 11/13/2020    History of Present Illness Pt admitted to [facility] on 11/09/20 for elective L THA revision. He is 64yrs/p L THA. Pt is WBAT on LLE and with posterior hip precautions including: no hip flexion >90deg, no hip IR, and no hip ADD. Significant PMH includes: stroke.    PT Comments    Pt seen this pm, still up in bedside chair since am session. Pt able to rise from chair with supervision. Gait training with RW in room 250fx 2 with seated rest break on edge of bed.  Discussed proper car transfer technique in order to maintain L hip precautions. Also educated on safe negotiation of stairs, pt states good understanding.  Pt returned to chair upon completion of session. Call bell in reach, all needs met.    Follow Up Recommendations  SNF     Equipment Recommendations       Recommendations for Other Services Rehab consult     Precautions / Restrictions Precautions Precautions: Posterior Hip;Fall Precaution Booklet Issued: Yes (comment) Restrictions Weight Bearing Restrictions: Yes LLE Weight Bearing: Weight bearing as tolerated    Mobility  Bed Mobility                    Transfers Overall transfer level: Modified independent Equipment used: Rolling walker (2 wheeled) Transfers: Sit to/from Stand Sit to Stand: Modified independent (Device/Increase time)            Ambulation/Gait Ambulation/Gait assistance: Supervision Gait Distance (Feet):  (50) Assistive device: Rolling walker (2 wheeled) Gait Pattern/deviations: Step-to pattern;Decreased weight shift to left         Stairs             Wheelchair Mobility    Modified Rankin (Stroke Patients Only)       Balance                                            Cognition Arousal/Alertness: Awake/alert Behavior During Therapy: WFL for tasks  assessed/performed Overall Cognitive Status: Within Functional Limits for tasks assessed                                 General Comments: Pt able to recall 3/3 posterior hip precautions and WB precautions without cues      Exercises      General Comments        Pertinent Vitals/Pain Pain Assessment: 0-10 Pain Score: 5  Pain Location: L hip Pain Descriptors / Indicators: Sore;Discomfort Pain Intervention(s): Monitored during session    Home Living                      Prior Function            PT Goals (current goals can now be found in the care plan section) Acute Rehab PT Goals Patient Stated Goal: To return to PLOF and go fishing    Frequency    BID      PT Plan Discharge plan needs to be updated    Co-evaluation              AM-PAC PT "6 Clicks" Mobility   Outcome Measure  Help needed turning from your back to  your side while in a flat bed without using bedrails?: A Little Help needed moving from lying on your back to sitting on the side of a flat bed without using bedrails?: A Little Help needed moving to and from a bed to a chair (including a wheelchair)?: A Little Help needed standing up from a chair using your arms (e.g., wheelchair or bedside chair)?: A Little Help needed to walk in hospital room?: A Little Help needed climbing 3-5 steps with a railing? : A Little 6 Click Score: 18    End of Session Equipment Utilized During Treatment: Gait belt Activity Tolerance: Patient tolerated treatment well Patient left: with call bell/phone within reach;with nursing/sitter in room;in chair Nurse Communication: Mobility status;Patient requests pain meds PT Visit Diagnosis: Unsteadiness on feet (R26.81);Muscle weakness (generalized) (M62.81);Difficulty in walking, not elsewhere classified (R26.2);Pain Pain - Right/Left: Left Pain - part of body: Hip     Time: 1415-1455 PT Time Calculation (min) (ACUTE ONLY): 40  min  Charges:  $Gait Training: 23-37 mins $Therapeutic Activity: 8-22 mins                    Mikel Cella, PTA    [name] 11/13/2020, 3:15 PM

## 2020-11-13 NOTE — Progress Notes (Signed)
Remdesivir - Pharmacy Brief Note   O:  CXR: "Low lung volumes with vascular congestion" SpO2: 95-100% on RA   A/P:  Remdesivir 200 mg IVPB once followed by 100 mg IVPB daily x 4 days.   Renda Rolls, PharmD, [facility] 11/13/2020 2:54 AM

## 2020-11-13 NOTE — Progress Notes (Signed)
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  Patient's initial arthroplasty was done 3 years ago and he had been seen by orthopedic surgery for new onset pain in the groin and anterior thigh without any trauma or aggravating event.  Due to worsening of the pain he initially was treated with corticosteroid injection without any improvement and decision was made to revise the initial surgery.   Medical consult was requested because patient was said to have 2 episodes of tachycardia overnight with heart rate as high as 190 bpm.  2/21-tele with sinus tachy with max rate 105. PVC's presents 2/22-covid now positive. Asx. Discussed iv remdesivir if meets criteria he declined.    Subjective: No sob, cp, dizziness, diarrhea  Objective: Vitals:   11/13/20 0746 11/13/20 1103 11/13/20 1521 11/13/20 1958  BP: 130/82 115/69 103/73 112/64  Pulse: 94 99 74 98  Resp: _0 Temp: 98.7 F (37.1 C) 98.4 F (36.9 C) 98.5 F (36.9 C) 97.9 F (36.6 C)  TempSrc: Oral Oral Oral Oral  SpO2: 95% 98% 98% 97%  Weight:      Height:        Intake/Output Summary (Last 24 hours) at 11/13/2020 2037 Last data filed at 11/13/2020 0641 Gross per 24 hour  Intake 2480.54 ml  Output 1000 ml  Net 1480.54 ml   Filed Weights   11/09/20 0628 11/09/20 2009  Weight: 111.1 kg 116.4 kg    Examination: Nad, calm cta no r/r/w rrr s1/s2 no gallop Soft benign +bs  No edema Awake oriented     Data Reviewed: I have personally reviewed following labs and imaging studies  CBC: Recent Labs  Lab 11/09/20 1400 11/10/20 0432 11/11/20 0843 11/12/20 0359 11/13/20 0730  WBC  --   --   --  11.9* 10.3  NEUTROABS  --   --   --   --  8.0*   HGB 10.8* 9.6* 8.7* 7.9* 8.2*  HCT 31.5* 28.3* 24.7* 23.1* 23.9*  MCV  --   --   --  71.3* 70.9*  PLT  --   --   --  278 030   Basic Metabolic Panel: Recent Labs  Lab 11/11/20 0843 11/12/20 0359 11/13/20 0730  NA 136 136 136  K 3.3* 4.1 3.6  CL 101 102 101  CO2 _1 GLUCOSE 145* 131* 133*  BUN _2 CREATININE 0.87 0.89 0.87  CALCIUM 8.6* 8.6* 8.8*   GFR: Estimated Creatinine Clearance: 106.3 mL/min (by C-G formula based on SCr of 0.87 mg/dL). Liver Function Tests: Recent Labs  Lab 11/12/20 0359 11/13/20 0730  AST 32 31  ALT 9 15  ALKPHOS 67 68  BILITOT 0.9 0.8  PROT 6.1* 6.3*  ALBUMIN 2.9* 3.0*   No results for input(s): LIPASE, AMYLASE in the last 168 hours. No results for input(s): AMMONIA in the last 168 hours. Coagulation Profile: No results for input(s): INR, PROTIME in the last 168 hours. Cardiac Enzymes: No results for input(s): CKTOTAL, CKMB, CKMBINDEX, TROPONINI in the last 168 hours. BNP (last 3 results) No results for input(s): PROBNP in the last 8760 hours. HbA1C: No results for input(s): HGBA1C in the last 72 hours. CBG: Recent Labs  Lab 11/12/20  2033 11/13/20 0743 11/13/20 1100 11/13/20 1630 11/13/20 2000  GLUCAP 134* 130* 148* 119* 144*   Lipid Profile: No results for input(s): CHOL, HDL, LDLCALC, TRIG, CHOLHDL, LDLDIRECT in the last 72 hours. Thyroid Function Tests: Recent Labs    11/11/20 1213  TSH 0.771  T4TOTAL 5.9   Anemia Panel: No results for input(s): VITAMINB12, FOLATE, FERRITIN, TIBC, IRON, RETICCTPCT in the last 72 hours. Sepsis Labs: No results for input(s): PROCALCITON, LATICACIDVEN in the last 168 hours.  Recent Results (from the past 240 hour(s))  SARS CORONAVIRUS 2 (TAT 6-24 HRS) Nasopharyngeal Nasopharyngeal Swab     Status: None   Collection Time: 11/07/20 10:59 AM   Specimen: Nasopharyngeal Swab  Result Value Ref Range Status   SARS Coronavirus 2 NEGATIVE NEGATIVE Final    Comment:  (NOTE) SARS-CoV-2 target nucleic acids are NOT DETECTED.  The SARS-CoV-2 RNA is generally detectable in upper and lower respiratory specimens during the acute phase of infection. Negative results do not preclude SARS-CoV-2 infection, do not rule out co-infections with other pathogens, and should not be used as the sole basis for treatment or other patient management decisions. Negative results must be combined with clinical observations, patient history, and epidemiological information. The expected result is Negative.  Fact Sheet for Patients: SugarRoll.be  Fact Sheet for Healthcare Providers: https://www.woods-mathews.com/  This test is not yet approved or cleared by the [place] FDA and  has been authorized for detection and/or diagnosis of SARS-CoV-2 by FDA under an Emergency Use Authorization (EUA). This EUA will remain  in effect (meaning this test can be used) for the duration of the COVID-19 declaration under Se ction 564(b)(1) of the Act, 21 U.S.C. section 360bbb-3(b)(1), unless the authorization is terminated or revoked sooner.  Performed at [facility], [place] [address] [place], [place] [address]   Aerobic/Anaerobic Culture w Gram Stain (surgical/deep wound)     Status: None (Preliminary result)   Collection Time: 11/09/20  9:29 AM   Specimen: PATH Other; Tissue  Result Value Ref Range Status   Specimen Description   Final    WOUND Performed at [facility], [address], [place], [place] [address]    Special Requests   Final    LEFT HIP Performed at [facility], [address] [place], [place] [address]    Gram Stain   Final    FEW WBC SEEN NO ORGANISMS SEEN Performed at [facility], [address], [place], [place] [address]    Culture   Final    NO GROWTH 4 DAYS NO ANAEROBES ISOLATED; CULTURE IN PROGRESS FOR 5 DAYS Performed at [facility] [address]  [place], [place] [address]    Report Status PENDING  Incomplete  SARS CORONAVIRUS 2 (TAT 6-24 HRS) Nasopharyngeal Nasopharyngeal Swab     Status: Abnormal   Collection Time: 11/12/20  4:13 PM   Specimen: Nasopharyngeal Swab  Result Value Ref Range Status   SARS Coronavirus 2 POSITIVE (A) NEGATIVE Final    Comment: (NOTE) SARS-CoV-2 target nucleic acids are DETECTED.  The SARS-CoV-2 RNA is generally detectable in upper and lower respiratory specimens during the acute phase of infection. Positive results are indicative of the presence of SARS-CoV-2 RNA. Clinical correlation with patient history and other diagnostic information is  necessary to determine patient infection status. Positive results do not rule out bacterial infection or co-infection with other viruses.  The expected result is Negative.  Fact Sheet for Patients: SugarRoll.be  Fact  Sheet for Healthcare Providers: https://www.woods-mathews.com/  This test is not yet approved or cleared by the [place] FDA and  has been authorized for detection and/or diagnosis of SARS-CoV-2 by FDA under an Emergency Use Authorization (EUA). This EUA will remain  in effect (meaning this test can be used) for the duration of the COVID-19 declaration under Section 564(b)(1) of the Act, 21 U. S.C. section 360bbb-3(b)(1), unless the authorization is terminated or revoked sooner.   Performed at [facility], [place] [address] [place], [place] [address]          Radiology Studies: ECHOCARDIOGRAM COMPLETE  Result Date: 11/12/2020    ECHOCARDIOGRAM REPORT   Patient Name:   RENOLD KOZAR Smartt Date of Exam: 11/12/2020 Medical Rec #:  [id number]      Height:       68.0 in Accession #:    [id number]     Weight:       256.6 lb Date of Birth:  1956-03-17      BSA:          2.272 m Patient Age:    70 years       BP:           127/83 mmHg Patient Gender: M              HR:           109 bpm. Exam Location:   [facility] Procedure: 2D Echo, Color Doppler and Cardiac Doppler Indications:     SBE 421.0 / I28.8  History:         Patient has no prior history of Echocardiogram examinations.                  Stroke; Risk Factors:Hypertension and Diabetes.  Sonographer:     Sherrie Sport RDCS (AE) Referring Phys:  [id number] Collier Bullock Diagnosing Phys: Kathlyn Sacramento MD  Sonographer Comments: Suboptimal apical window and no subcostal window. IMPRESSIONS  1. Left ventricular ejection fraction, by estimation, is 60 to 65%. The left ventricle has normal function. The left ventricle has no regional wall motion abnormalities. There is mild left ventricular hypertrophy. Left ventricular diastolic parameters are consistent with Grade I diastolic dysfunction (impaired relaxation).  2. Right ventricular systolic function is normal. The right ventricular size is normal. Tricuspid regurgitation signal is inadequate for assessing PA pressure.  3. The mitral valve is normal in structure. No evidence of mitral valve regurgitation. No evidence of mitral stenosis.  4. The aortic valve is normal in structure. Aortic valve regurgitation is not visualized. Mild aortic valve sclerosis is present, with no evidence of aortic valve stenosis. Conclusion(s)/Recommendation(s): No evidence of valvular vegetations on this transthoracic echocardiogram. No intracardiac source of embolism detected on this transthoracic study. FINDINGS  Left Ventricle: Left ventricular ejection fraction, by estimation, is 60 to 65%. The left ventricle has normal function. The left ventricle has no regional wall motion abnormalities. The left ventricular internal cavity size was normal in size. There is  mild left ventricular hypertrophy. Left ventricular diastolic parameters are consistent with Grade I diastolic dysfunction (impaired relaxation). Right Ventricle: The right ventricular size is normal. No increase in right ventricular wall thickness. Right ventricular systolic function  is normal. Tricuspid regurgitation signal is inadequate for assessing PA pressure. Left Atrium: Left atrial size was normal in size. Right Atrium: Right atrial size was normal in size. Pericardium: There is no evidence of pericardial effusion. Mitral Valve: The mitral valve is normal in structure. No evidence  of mitral valve regurgitation. No evidence of mitral valve stenosis. Tricuspid Valve: The tricuspid valve is normal in structure. Tricuspid valve regurgitation is not demonstrated. No evidence of tricuspid stenosis. Aortic Valve: The aortic valve is normal in structure. Aortic valve regurgitation is not visualized. Mild aortic valve sclerosis is present, with no evidence of aortic valve stenosis. Aortic valve mean gradient measures 3.0 mmHg. Aortic valve peak gradient measures 6.9 mmHg. Aortic valve area, by VTI measures 4.07 cm. Pulmonic Valve: The pulmonic valve was normal in structure. Pulmonic valve regurgitation is not visualized. No evidence of pulmonic stenosis. Aorta: The aortic root is normal in size and structure. Venous: The inferior vena cava was not well visualized. IAS/Shunts: No atrial level shunt detected by color flow Doppler.  LEFT VENTRICLE PLAX 2D LVIDd:         4.87 cm  Diastology LVIDs:         2.87 cm  LV e' medial:    5.55 cm/s LV PW:         1.27 cm  LV E/e' medial:  11.6 LV IVS:        1.24 cm  LV e' lateral:   6.42 cm/s LVOT diam:     2.10 cm  LV E/e' lateral: 10.0 LV SV:         68 LV SV Index:   30 LVOT Area:     3.46 cm  RIGHT VENTRICLE RV Basal diam:  2.55 cm RV S prime:     19.90 cm/s TAPSE (M-mode): 4.3 cm LEFT ATRIUM           Index       RIGHT ATRIUM           Index LA diam:      3.60 cm 1.58 cm/m  RA Area:     12.60 cm LA Vol (A2C): 23.2 ml 10.21 ml/m RA Volume:   27.60 ml  12.15 ml/m LA Vol (A4C): 35.0 ml 15.40 ml/m  AORTIC VALVE                   PULMONIC VALVE AV Area (Vmax):    2.91 cm    PV Vmax:        0.39 m/s AV Area (Vmean):   3.08 cm    PV Peak grad:   0.6  mmHg AV Area (VTI):     4.07 cm    RVOT Peak grad: 2 mmHg AV Vmax:           131.00 cm/s AV Vmean:          82.200 cm/s AV VTI:            0.167 m AV Peak Grad:      6.9 mmHg AV Mean Grad:      3.0 mmHg LVOT Vmax:         110.00 cm/s LVOT Vmean:        73.200 cm/s LVOT VTI:          0.196 m LVOT/AV VTI ratio: 1.17  AORTA Ao Root diam: 3.47 cm MITRAL VALVE               TRICUSPID VALVE MV Area (PHT): 3.65 cm    TR Peak grad:   18.0 mmHg MV Decel Time: 208 msec    TR Vmax:        212.00 cm/s MV E velocity: 64.30 cm/s MV A velocity: 93.40 cm/s  SHUNTS MV E/A ratio:  0.69  Systemic VTI:  0.20 m                            Systemic Diam: 2.10 cm Kathlyn Sacramento MD Electronically signed by Kathlyn Sacramento MD Signature Date/Time: 11/12/2020/10:52:08 AM    Final         Scheduled Meds: . amLODipine  10 mg Oral Q breakfast  . cholecalciferol  5,000 Units Oral Daily  . enoxaparin (LOVENOX) injection  40 mg Subcutaneous Q24H  . ferrous sulfate  325 mg Oral BID WC  . insulin aspart  0-15 Units Subcutaneous TID WC  . magnesium hydroxide  30 mL Oral Daily  . metFORMIN  1,000 mg Oral BID PC  . metoprolol tartrate  25 mg Oral BID  . pantoprazole  40 mg Oral BID  . pravastatin  40 mg Oral q1800  . senna-docusate  1 tablet Oral BID   Continuous Infusions: . sodium chloride 100 mL/hr at 11/11/20 1500  . sodium chloride 100 mL/hr at 11/13/20 0641  . [START ON 11/14/2020] remdesivir 100 mg in NS 100 mL      Assessment & Plan:   Principal Problem:   Tachycardia with heart rate 121-140 beats per minute Active Problems:   Type 2 diabetes mellitus with other specified complication (HCC)   Hypertension   Severe obesity (BMI 35.0-39.9) with comorbidity ([place])   S/P revision of total hip   1. Sinus tachycardia Medical consult requested for sinus tachycardia.  Patient is status post revision left total hip arthroplasty which was done on 11/09/20 and over the last 24 hours has had 2 episodes of documented  tachycardia with heart rates as high as 190 bpm.  2/21-asx. Tele with HR better controlled.  Likely 2/2 pain.  As per nursing patient was in really asking for pain meds and telling anyone he was having pain Echo with nml EF CTA chest negative for PE. 2/22-resolved, hr controlled. Continue to monitor     2.  Diabetes mellitus BG stable Continue RISS     3.  Hypokalemia  replaced. Stable Continue replacement   4.  Hypertension Stable  Continue amlodipine Continue metoprolol   5.thyroid nodule-found on CT scan. Stable since 9/21.  F/u with pcp as outpt for thyroid US  6.Covid +- asx Declined iv remdesivir if met criteria  DVT prophylaxis: Lovenox  Disposition Plan: SNF Per PT recommendation            LOS: 4 days   Time spent: 35 min with >50% on coc   Nolberto Hanlon, MD [facility] Pager [phone]  If 7PM-7AM, please contact night-coverage 11/13/2020, 8:37 PM

## 2020-11-13 NOTE — Progress Notes (Signed)
  Subjective: 4 Days Post-Op Procedure(s) (LRB): TOTAL HIP REVISION (Left) Patient reports pain as mild.   Patient is well, and has had no acute complaints or problems. Denies any cough, chest pain, or fever. Plan is to go Skilled nursing facility after hospital stay. Negative for chest pain and shortness of breath Fever: no Gastrointestinal: negative for nausea and vomiting.   Patient has not had a bowel movement so far, but reports passing gas.  Objective: Vital signs in last 24 hours: Temp:  [97.7 F (36.5 C)-99.1 F (37.3 C)] 98.7 F (37.1 C) (02/22 0746) Pulse Rate:  [90-109] 94 (02/22 0746) Resp:  [14-20] 20 (02/22 0746) BP: (113-130)/(72-83) 130/82 (02/22 0746) SpO2:  [95 %-100 %] 95 % (02/22 0746)  Intake/Output from previous day:  Intake/Output Summary (Last 24 hours) at 11/13/2020 0759 Last data filed at 11/13/2020 0641 Gross per 24 hour  Intake 2480.54 ml  Output 1925 ml  Net 555.54 ml    Intake/Output this shift: No intake/output data recorded.  Labs: Recent Labs    11/11/20 0843 11/12/20 0359  HGB 8.7* 7.9*   Recent Labs    11/11/20 0843 11/12/20 0359  WBC  --  11.9*  RBC  --  3.24*  HCT 24.7* 23.1*  PLT  --  278   Recent Labs    11/11/20 0843 11/12/20 0359  NA 136 136  K 3.3* 4.1  CL 101 102  CO2 25 27  BUN 10 12  CREATININE 0.87 0.89  GLUCOSE 145* 131*  CALCIUM 8.6* 8.6*   No results for input(s): LABPT, INR in the last 72 hours.   EXAM General - Patient is Alert, Appropriate and Oriented Extremity - Neurovascular intact Dorsiflexion/Plantar flexion intact Compartment soft Dressing/Incision -clean, dry, minimal sanguinous drainage Motor Function - intact, moving foot and toes well on exam.  Cardiovascular- tachycardic rate, regular rhythm, no m/r/g Respiratory- Lungs clear to auscultation bilaterally Gastrointestinal- soft, nontender and active bowel sounds   Assessment/Plan: 4 Days Post-Op Procedure(s) (LRB): TOTAL HIP  REVISION (Left) Principal Problem:   Tachycardia with heart rate 121-140 beats per minute Active Problems:   Type 2 diabetes mellitus with other specified complication (HCC)   Hypertension   Severe obesity (BMI 35.0-39.9) with comorbidity (HCC)   S/P revision of total hip  Estimated body mass index is 39.02 kg/m as calculated from the following:   Height as of this encounter: 5\' 8"  (1.727 m).   Weight as of this encounter: 116.4 kg. Advance diet Up with therapy Maintain posterior hip precautions.   Bowel regimen  -patient continues to report no BM  -Lactulose 10g, oral, as needed  Post surgical anemia Last Hg/Hct was 7.9/23.1. Repeat CBC placed.  Covid positive -patient is asymptomatic today -continue remdesivir per hospitalist discretion  DVT Prophylaxis - Lovenox Weight-Bearing as tolerated to left leg  Cassell Smiles, PA-C [facility] 11/13/2020, 7:59 AM

## 2020-11-13 NOTE — TOC Progression Note (Signed)
Transition of Care [facility]) - Progression Note    Patient Details  Name: Jesus Bell MRN: [id number] Date of Birth: October 13, 1955  [facility]) CM/SW [place], RN Phone Number: 11/13/2020, 2:16 PM  Clinical Narrative:   RNCM reached out to patient by telephone to discuss his change in plans to go home at discharge. Patient reports that he has just decided this is the best option for him. He reports that he has already been contacted by home health agency but has not called them back yet. Discussed that this is [facility] home and that this RNCM will reach out to them to assure they are still able to provide care. Patient reports that he has all necessary equipment at home from previously having surgery.  RNCM reached out to [place] with [facility] and she will be able to provide care. RNCM will arrange transport home for patient if necessary at discharge.     Expected Discharge Plan: [place] Barriers to Discharge: No Barriers Identified  Expected Discharge Plan and Services Expected Discharge Plan: [place] arrangements for the past 2 months: [place]                                       Social Determinants of Health (SDOH) Interventions    Readmission Risk Interventions No flowsheet data found.

## 2020-11-13 NOTE — Progress Notes (Signed)
Physical Therapy Treatment Patient Details Name: Jesus Bell MRN: [id number] DOB: 12/27/1955 Today's Date: 11/13/2020    History of Present Illness Pt admitted to [facility] on 11/09/20 for elective L THA revision. He is 72yr s/p L THA. Pt is WBAT on LLE and with posterior hip precautions including: no hip flexion >90deg, no hip IR, and no hip ADD. Significant PMH includes: stroke.    PT Comments    Pt seen this am.  Positive for Covid at this time.  Pt seen in room for functional mobility and strengthening.  Increased tolerance for mobility today with less c/o pain.  Pt able to transfer supine to sit with supervision and good demonstration of maintaining hip precautions. Sit <> stand transfers with supervision. Gait training in room with RW 30ft with Supervision and increased time to maneuver safely around objects.  Pt has made significant progress since previous day.  Will focus on increasing gait distance next session.    Follow Up Recommendations  SNF     Equipment Recommendations       Recommendations for Other Services Rehab consult     Precautions / Restrictions Precautions Precautions: Posterior Hip;Fall Precaution Booklet Issued: Yes (comment) Restrictions Weight Bearing Restrictions: Yes LLE Weight Bearing: Weight bearing as tolerated    Mobility  Bed Mobility                    Transfers Overall transfer level: Modified independent Equipment used: Rolling walker (2 wheeled) Transfers: Sit to/from Stand Sit to Stand: Modified independent (Device/Increase time)            Ambulation/Gait Ambulation/Gait assistance: Supervision Gait Distance (Feet):  (50) Assistive device: Rolling walker (2 wheeled) Gait Pattern/deviations: Step-to pattern;Decreased weight shift to left         Stairs             Wheelchair Mobility    Modified Rankin (Stroke Patients Only)       Balance                                             Cognition Arousal/Alertness: Awake/alert Behavior During Therapy: WFL for tasks assessed/performed Overall Cognitive Status: Within Functional Limits for tasks assessed                                 General Comments: Pt able to recall 3/3 posterior hip precautions and WB precautions without cues      Exercises      General Comments        Pertinent Vitals/Pain Pain Assessment: 0-10 Pain Score: 5  Pain Location: L hip Pain Descriptors / Indicators: Sore;Discomfort Pain Intervention(s): Monitored during session    Home Living                      Prior Function            PT Goals (current goals can now be found in the care plan section) Acute Rehab PT Goals Patient Stated Goal: To return to PLOF and go fishing    Frequency    BID      PT Plan Discharge plan needs to be updated    Co-evaluation              AM-PAC PT "6 Clicks" Mobility  Outcome Measure  Help needed turning from your back to your side while in a flat bed without using bedrails?: A Little Help needed moving from lying on your back to sitting on the side of a flat bed without using bedrails?: A Little Help needed moving to and from a bed to a chair (including a wheelchair)?: A Little Help needed standing up from a chair using your arms (e.g., wheelchair or bedside chair)?: A Little Help needed to walk in hospital room?: A Little Help needed climbing 3-5 steps with a railing? : A Little 6 Click Score: 18    End of Session Equipment Utilized During Treatment: Gait belt Activity Tolerance: Patient tolerated treatment well Patient left: with call bell/phone within reach;with nursing/sitter in room;in chair Nurse Communication: Mobility status;Patient requests pain meds PT Visit Diagnosis: Unsteadiness on feet (R26.81);Muscle weakness (generalized) (M62.81);Difficulty in walking, not elsewhere classified (R26.2);Pain Pain - Right/Left: Left Pain - part of body:  Hip     Time: 1415-1455 PT Time Calculation (min) (ACUTE ONLY): 40 min  Charges:  $Gait Training: 23-37 mins $Therapeutic Activity: 8-22 mins                     Mikel Cella, PTA

## 2020-11-14 ENCOUNTER — Encounter: Payer: Self-pay | Admitting: Orthopedic Surgery

## 2020-11-14 DIAGNOSIS — I10 Essential (primary) hypertension: Secondary | ICD-10-CM | POA: Diagnosis not present

## 2020-11-14 DIAGNOSIS — E1169 Type 2 diabetes mellitus with other specified complication: Secondary | ICD-10-CM | POA: Diagnosis not present

## 2020-11-14 DIAGNOSIS — E7800 Pure hypercholesterolemia, unspecified: Secondary | ICD-10-CM | POA: Diagnosis not present

## 2020-11-14 DIAGNOSIS — K219 Gastro-esophageal reflux disease without esophagitis: Secondary | ICD-10-CM | POA: Diagnosis not present

## 2020-11-14 DIAGNOSIS — R000 Tachycardia, unspecified: Secondary | ICD-10-CM | POA: Diagnosis not present

## 2020-11-14 DIAGNOSIS — M169 Osteoarthritis of hip, unspecified: Secondary | ICD-10-CM | POA: Diagnosis not present

## 2020-11-14 LAB — AEROBIC/ANAEROBIC CULTURE W GRAM STAIN (SURGICAL/DEEP WOUND): Culture: NO GROWTH

## 2020-11-14 LAB — GLUCOSE, CAPILLARY
Glucose-Capillary: 117 mg/dL — ABNORMAL HIGH (ref 70–99)
Glucose-Capillary: 134 mg/dL — ABNORMAL HIGH (ref 70–99)

## 2020-11-14 MED ORDER — ENOXAPARIN SODIUM 40 MG/0.4ML ~~LOC~~ SOLN: 40.0000 mg | SUBCUTANEOUS | 0 refills | Status: DC

## 2020-11-14 MED ORDER — OXYCODONE HCL 5 MG PO TABS: 5.0000 mg | ORAL_TABLET | ORAL | 0 refills | Status: DC | PRN

## 2020-11-14 MED ORDER — METOPROLOL TARTRATE 25 MG PO TABS
25.0000 mg | ORAL_TABLET | Freq: Two times a day (BID) | ORAL | 0 refills | Status: DC
Start: 2020-11-14 — End: 2021-05-15

## 2020-11-14 NOTE — Plan of Care (Signed)

## 2020-11-14 NOTE — Progress Notes (Addendum)
Physical Therapy Treatment Patient Details Name: Jesus Bell MRN: [id number] DOB: [birth year] Today's Date: 11/14/2020    History of Present Illness Pt admitted to [facility] on 11/09/20 for elective L THA revision. He is 44yr s/p L THA. Pt is WBAT on LLE and with posterior hip precautions including: no hip flexion >90deg, no hip IR, and no hip ADD. Significant PMH includes: stroke.    PT Comments    Pt seen this am for review and completion of L LE HEP. Pt required assist with hip flexion and hip ABD/ADD.  Pt able to recall hip precautions without difficulty.  Currently pt is able to transfer OOB with supervision/ModI, sit <> stand with supervision/ModI, Pt has progressed significantly with ambulation and stated he has been mobilizing self in room with RW independently since yesterday.  Unable to attempt stairs at this time due to Covid restrictions, however pt is aware of safe technique since having recent hip surgery a few months ago. Pt is refusing short term rehab at SNF and will be going home with HHPT despite recommendations. Pt seen again this am to simulate stair training with portable single step.  Pt demonstrated ability to negotiate step with R sided rail as well as bilateral support.  Verbal cues given for technique and increased safety awareness. Pt encouraged to have his son present upon discharge.   Follow Up Recommendations  SNF     Equipment Recommendations       Recommendations for Other Services Rehab consult     Precautions / Restrictions Precautions Precautions: Posterior Hip;Fall Precaution Booklet Issued: Yes (comment) Restrictions Weight Bearing Restrictions: Yes LLE Weight Bearing: Weight bearing as tolerated    Mobility  Bed Mobility                    Transfers                    Ambulation/Gait                 Stairs             Wheelchair Mobility    Modified Rankin (Stroke Patients Only)       Balance                                             Cognition Arousal/Alertness: Awake/alert Behavior During Therapy: WFL for tasks assessed/performed Overall Cognitive Status: Within Functional Limits for tasks assessed                                 General Comments: Pt able to recall 3/3 posterior hip precautions and WB precautions without cues      Exercises Total Joint Exercises Ankle Circles/Pumps: AROM;Left;20 reps Quad Sets: AROM;Left;10 reps Gluteal Sets: AROM;Left;10 reps Heel Slides: AROM;AAROM;Left;10 reps Hip ABduction/ADduction: AAROM;Left;10 reps Other Exercises Other Exercises: Reviewed pt's exercises to be completed independently, pt with good understanding and proper demonstration.    General Comments        Pertinent Vitals/Pain Pain Assessment: 0-10 Pain Score: 3  Pain Location: L hip Pain Descriptors / Indicators: Sore;Discomfort    Home Living                      Prior Function  PT Goals (current goals can now be found in the care plan section) Acute Rehab PT Goals Patient Stated Goal: To return to PLOF and go fishing    Frequency    BID      PT Plan      Co-evaluation              AM-PAC PT "6 Clicks" Mobility   Outcome Measure  Help needed turning from your back to your side while in a flat bed without using bedrails?: A Little Help needed moving from lying on your back to sitting on the side of a flat bed without using bedrails?: A Little Help needed moving to and from a bed to a chair (including a wheelchair)?: A Little Help needed standing up from a chair using your arms (e.g., wheelchair or bedside chair)?: A Little Help needed to walk in hospital room?: A Little Help needed climbing 3-5 steps with a railing? : A Little 6 Click Score: 18    End of Session   Activity Tolerance: Patient tolerated treatment well Patient left: in bed;with call bell/phone within reach   PT Visit  Diagnosis: Unsteadiness on feet (R26.81);Muscle weakness (generalized) (M62.81);Difficulty in walking, not elsewhere classified (R26.2);Pain Pain - Right/Left: Left Pain - part of body: Hip     Time: 1010-1025 PT Time Calculation (min) (ACUTE ONLY): 15 min  Charges:  $Therapeutic Exercise: 8-22 mins                     Mikel Cella, PTA    [name] 11/14/2020, 11:03 AM

## 2020-11-14 NOTE — Progress Notes (Signed)
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  Patient's initial arthroplasty was done 3 years ago and he had been seen by orthopedic surgery for new onset pain in the groin and anterior thigh without any trauma or aggravating event.  Due to worsening of the pain he initially was treated with corticosteroid injection without any improvement and decision was made to revise the initial surgery.   Medical consult was requested because patient was said to have 2 episodes of tachycardia overnight with heart rate as high as 190 bpm.  2/21-tele with sinus tachy with max rate 105. PVC's presents 2/22-covid now positive. Asx. Discussed iv remdesivir if meets criteria he declined. 2/23: pain well controlled, HR improved.  No fevers, chills cough.  No CP or palpitations    Subjective: No complaints.  Pain well controlled  Objective: Vitals:   11/13/20 1958 11/14/20 0001 11/14/20 0529 11/14/20 0739  BP: 112/64 120/73 114/77 126/65  Pulse: 98 80 82 94  Resp: 16 16 18 15   Temp: 97.9 F (36.6 C) 98.4 F (36.9 C) 98.5 F (36.9 C) 98.7 F (37.1 C)  TempSrc: Oral     SpO2: 97% 98% 100% 99%  Weight:      Height:       No intake or output data in the 24 hours ending 11/14/20 0926 Filed Weights   11/09/20 0628 11/09/20 2009  Weight: 111.1 kg 116.4 kg    Examination: Nad, calm cta no r/r/w rrr s1/s2 no gallop Soft benign +bs  No edema Awake oriented     Data Reviewed: I have personally reviewed following labs and imaging studies  CBC: Recent Labs  Lab 11/09/20 1400 11/10/20 0432 11/11/20 0843 11/12/20 0359 11/13/20 0730  WBC  --   --   --  11.9* 10.3  NEUTROABS  --   --   --   --  8.0*  HGB 10.8* 9.6* 8.7*  7.9* 8.2*  HCT 31.5* 28.3* 24.7* 23.1* 23.9*  MCV  --   --   --  71.3* 70.9*  PLT  --   --   --  278 734   Basic Metabolic Panel: Recent Labs  Lab 11/11/20 0843 11/12/20 0359 11/13/20 0730  NA 136 136 136  K 3.3* 4.1 3.6  CL 101 102 101  CO2 25 27 26   GLUCOSE 145* 131* 133*  BUN 10 12 10   CREATININE 0.87 0.89 0.87  CALCIUM 8.6* 8.6* 8.8*   GFR: Estimated Creatinine Clearance: 106.3 mL/min (by C-G formula based on SCr of 0.87 mg/dL). Liver Function Tests: Recent Labs  Lab 11/12/20 0359 11/13/20 0730  AST 32 31  ALT 9 15  ALKPHOS 67 68  BILITOT 0.9 0.8  PROT 6.1* 6.3*  ALBUMIN 2.9* 3.0*   No results for input(s): LIPASE, AMYLASE in the last 168 hours. No results for input(s): AMMONIA in the last 168 hours. Coagulation Profile: No results for input(s): INR, PROTIME in the last 168 hours. Cardiac Enzymes: No results for input(s): CKTOTAL, CKMB, CKMBINDEX, TROPONINI in the last 168 hours. BNP (last 3 results) No results for input(s): PROBNP in the last 8760 hours. HbA1C: No results for input(s): HGBA1C in the last 72 hours. CBG: Recent Labs  Lab 11/13/20 0743 11/13/20 1100  11/13/20 1630 11/13/20 2000 11/14/20 0740  GLUCAP 130* 148* 119* 144* 117*   Lipid Profile: No results for input(s): CHOL, HDL, LDLCALC, TRIG, CHOLHDL, LDLDIRECT in the last 72 hours. Thyroid Function Tests: Recent Labs    11/11/20 1213  TSH 0.771  T4TOTAL 5.9   Anemia Panel: No results for input(s): VITAMINB12, FOLATE, FERRITIN, TIBC, IRON, RETICCTPCT in the last 72 hours. Sepsis Labs: No results for input(s): PROCALCITON, LATICACIDVEN in the last 168 hours.  Recent Results (from the past 240 hour(s))  SARS CORONAVIRUS 2 (TAT 6-24 HRS) Nasopharyngeal Nasopharyngeal Swab     Status: None   Collection Time: 11/07/20 10:59 AM   Specimen: Nasopharyngeal Swab  Result Value Ref Range Status   SARS Coronavirus 2 NEGATIVE NEGATIVE Final    Comment: (NOTE) SARS-CoV-2 target nucleic  acids are NOT DETECTED.  The SARS-CoV-2 RNA is generally detectable in upper and lower respiratory specimens during the acute phase of infection. Negative results do not preclude SARS-CoV-2 infection, do not rule out co-infections with other pathogens, and should not be used as the sole basis for treatment or other patient management decisions. Negative results must be combined with clinical observations, patient history, and epidemiological information. The expected result is Negative.  Fact Sheet for Patients: SugarRoll.be  Fact Sheet for Healthcare Providers: https://www.woods-mathews.com/  This test is not yet approved or cleared by the [place] FDA and  has been authorized for detection and/or diagnosis of SARS-CoV-2 by FDA under an Emergency Use Authorization (EUA). This EUA will remain  in effect (meaning this test can be used) for the duration of the COVID-19 declaration under Se ction 564(b)(1) of the Act, 21 U.S.C. section 360bbb-3(b)(1), unless the authorization is terminated or revoked sooner.  Performed at [facility], [place] [address], [place], [place] [address]   Aerobic/Anaerobic Culture w Gram Stain (surgical/deep wound)     Status: None (Preliminary result)   Collection Time: 11/09/20  9:29 AM   Specimen: PATH Other; Tissue  Result Value Ref Range Status   Specimen Description   Final    WOUND Performed at [facility], [address], [place], [place] [address]    Special Requests   Final    LEFT HIP Performed at [facility], [address] [place], [place] [address]    Gram Stain   Final    FEW WBC SEEN NO ORGANISMS SEEN Performed at [facility], [address] [place], [place] [address]    Culture   Final    NO GROWTH 4 DAYS NO ANAEROBES ISOLATED; CULTURE IN PROGRESS FOR 5 DAYS Performed at [place] [address], [place], [place] [address]    Report  Status PENDING  Incomplete  SARS CORONAVIRUS 2 (TAT 6-24 HRS) Nasopharyngeal Nasopharyngeal Swab     Status: Abnormal   Collection Time: 11/12/20  4:13 PM   Specimen: Nasopharyngeal Swab  Result Value Ref Range Status   SARS Coronavirus 2 POSITIVE (A) NEGATIVE Final    Comment: (NOTE) SARS-CoV-2 target nucleic acids are DETECTED.  The SARS-CoV-2 RNA is generally detectable in upper and lower respiratory specimens during the acute phase of infection. Positive results are indicative of the presence of SARS-CoV-2 RNA. Clinical correlation with patient history and other diagnostic information is  necessary to determine patient infection status. Positive results do not rule out bacterial infection or co-infection with other viruses.  The expected result is Negative.  Fact Sheet for Patients: SugarRoll.be  Fact Sheet for Healthcare  Providers: https://www.woods-mathews.com/  This test is not yet approved or cleared by the [place] and  has been authorized for detection and/or diagnosis of SARS-CoV-2 by FDA under an Emergency Use Authorization (EUA). This EUA will remain  in effect (meaning this test can be used) for the duration of the COVID-19 declaration under Section 564(b)(1) of the Act, 21 U. S.C. section 360bbb-3(b)(1), unless the authorization is terminated or revoked sooner.   Performed at [facility], [place] [address], [place], [place] [address]          Radiology Studies: ECHOCARDIOGRAM COMPLETE  Result Date: 11/12/2020    ECHOCARDIOGRAM REPORT   Patient Name:   Jesus Bell Date of Exam: 11/12/2020 Medical Rec #:  [id number]      Height:       68.0 in Accession #:    [id number]     Weight:       256.6 lb Date of Birth:  [birth year]      BSA:          2.272 m Patient Age:    57 years       BP:           127/83 mmHg Patient Gender: M              HR:           109 bpm. Exam Location:  [facility] Procedure: 2D Echo, Color  Doppler and Cardiac Doppler Indications:     SBE 421.0 / I28.8  History:         Patient has no prior history of Echocardiogram examinations.                  Stroke; Risk Factors:Hypertension and Diabetes.  Sonographer:     Sherrie Sport RDCS (AE) Referring Phys:  [id number] Collier Bullock Diagnosing Phys: Kathlyn Sacramento MD  Sonographer Comments: Suboptimal apical window and no subcostal window. IMPRESSIONS  1. Left ventricular ejection fraction, by estimation, is 60 to 65%. The left ventricle has normal function. The left ventricle has no regional wall motion abnormalities. There is mild left ventricular hypertrophy. Left ventricular diastolic parameters are consistent with Grade I diastolic dysfunction (impaired relaxation).  2. Right ventricular systolic function is normal. The right ventricular size is normal. Tricuspid regurgitation signal is inadequate for assessing PA pressure.  3. The mitral valve is normal in structure. No evidence of mitral valve regurgitation. No evidence of mitral stenosis.  4. The aortic valve is normal in structure. Aortic valve regurgitation is not visualized. Mild aortic valve sclerosis is present, with no evidence of aortic valve stenosis. Conclusion(s)/Recommendation(s): No evidence of valvular vegetations on this transthoracic echocardiogram. No intracardiac source of embolism detected on this transthoracic study. FINDINGS  Left Ventricle: Left ventricular ejection fraction, by estimation, is 60 to 65%. The left ventricle has normal function. The left ventricle has no regional wall motion abnormalities. The left ventricular internal cavity size was normal in size. There is  mild left ventricular hypertrophy. Left ventricular diastolic parameters are consistent with Grade I diastolic dysfunction (impaired relaxation). Right Ventricle: The right ventricular size is normal. No increase in right ventricular wall thickness. Right ventricular systolic function is normal. Tricuspid  regurgitation signal is inadequate for assessing PA pressure. Left Atrium: Left atrial size was normal in size. Right Atrium: Right atrial size was normal in size. Pericardium: There is no evidence of pericardial effusion. Mitral Valve: The mitral valve is normal in structure. No evidence of mitral valve  regurgitation. No evidence of mitral valve stenosis. Tricuspid Valve: The tricuspid valve is normal in structure. Tricuspid valve regurgitation is not demonstrated. No evidence of tricuspid stenosis. Aortic Valve: The aortic valve is normal in structure. Aortic valve regurgitation is not visualized. Mild aortic valve sclerosis is present, with no evidence of aortic valve stenosis. Aortic valve mean gradient measures 3.0 mmHg. Aortic valve peak gradient measures 6.9 mmHg. Aortic valve area, by VTI measures 4.07 cm. Pulmonic Valve: The pulmonic valve was normal in structure. Pulmonic valve regurgitation is not visualized. No evidence of pulmonic stenosis. Aorta: The aortic root is normal in size and structure. Venous: The inferior vena cava was not well visualized. IAS/Shunts: No atrial level shunt detected by color flow Doppler.  LEFT VENTRICLE PLAX 2D LVIDd:         4.87 cm  Diastology LVIDs:         2.87 cm  LV e' medial:    5.55 cm/s LV PW:         1.27 cm  LV E/e' medial:  11.6 LV IVS:        1.24 cm  LV e' lateral:   6.42 cm/s LVOT diam:     2.10 cm  LV E/e' lateral: 10.0 LV SV:         68 LV SV Index:   30 LVOT Area:     3.46 cm  RIGHT VENTRICLE RV Basal diam:  2.55 cm RV S prime:     19.90 cm/s TAPSE (M-mode): 4.3 cm LEFT ATRIUM           Index       RIGHT ATRIUM           Index LA diam:      3.60 cm 1.58 cm/m  RA Area:     12.60 cm LA Vol (A2C): 23.2 ml 10.21 ml/m RA Volume:   27.60 ml  12.15 ml/m LA Vol (A4C): 35.0 ml 15.40 ml/m  AORTIC VALVE                   PULMONIC VALVE AV Area (Vmax):    2.91 cm    PV Vmax:        0.39 m/s AV Area (Vmean):   3.08 cm    PV Peak grad:   0.6 mmHg AV Area (VTI):      4.07 cm    RVOT Peak grad: 2 mmHg AV Vmax:           131.00 cm/s AV Vmean:          82.200 cm/s AV VTI:            0.167 m AV Peak Grad:      6.9 mmHg AV Mean Grad:      3.0 mmHg LVOT Vmax:         110.00 cm/s LVOT Vmean:        73.200 cm/s LVOT VTI:          0.196 m LVOT/AV VTI ratio: 1.17  AORTA Ao Root diam: 3.47 cm MITRAL VALVE               TRICUSPID VALVE MV Area (PHT): 3.65 cm    TR Peak grad:   18.0 mmHg MV Decel Time: 208 msec    TR Vmax:        212.00 cm/s MV E velocity: 64.30 cm/s MV A velocity: 93.40 cm/s  SHUNTS MV E/A ratio:  0.69  Systemic VTI:  0.20 m                            Systemic Diam: 2.10 cm Kathlyn Sacramento MD Electronically signed by Kathlyn Sacramento MD Signature Date/Time: 11/12/2020/10:52:08 AM    Final         Scheduled Meds: . amLODipine  10 mg Oral Q breakfast  . cholecalciferol  5,000 Units Oral Daily  . enoxaparin (LOVENOX) injection  40 mg Subcutaneous Q24H  . ferrous sulfate  325 mg Oral BID WC  . insulin aspart  0-15 Units Subcutaneous TID WC  . magnesium hydroxide  30 mL Oral Daily  . metFORMIN  1,000 mg Oral BID PC  . metoprolol tartrate  25 mg Oral BID  . pantoprazole  40 mg Oral BID  . pravastatin  40 mg Oral q1800  . senna-docusate  1 tablet Oral BID   Continuous Infusions: . sodium chloride 100 mL/hr at 11/11/20 1500  . sodium chloride 100 mL/hr at 11/13/20 0641  . remdesivir 100 mg in NS 100 mL      Assessment & Plan:   Principal Problem:   Tachycardia with heart rate 121-140 beats per minute Active Problems:   Type 2 diabetes mellitus with other specified complication (HCC)   Hypertension   Severe obesity (BMI 35.0-39.9) with comorbidity ([place])   S/P revision of total hip   Sinus tachycardia  - Medical consult requested for sinus tachycardia.   - Patient is status post revision left total hip arthroplasty which was done on 11/09/20 and over the last 24 hours has had 2 episodes of documented tachycardia with heart rates as high  as 190 bpm.   Echo with nml EF CTA chest negative for PE. 2/23: Pain well controlled.  Tachycardia improved  Recommendations: Continue metoprolol per current dosing regimen Ensure pain control  - I suspect patients tachycardia may have been 2/2 to post-operative pain in the setting of covid infection.  As patient asymptomatic, does not need any covid specific antiviral therapies at this time    COVID infection Discovered incidentally Asymptomatic No fever, no pneumonia on imaging, no oxygen requiement Recommendations: Pt had refused remdesivir Given lack of any symptoms at this time, ok to defer any covid specific therapies for now     Diabetes mellitus CBG within acceptable range Continue SSI and carb modified diet    Hypokalemia  replaced. Stable Continue replacement   Hypertension Stable  Continue amlodipine Continue metoprolol   thyroid nodule -found on CT scan.  Stable since 9/21.  F/u with pcp as outpt for thyroid US   DVT prophylaxis: Lovenox  Disposition Plan: Per primary orthopedic service  Thank you for allowing [facility] to participate in the care of your patient Patient stable for discharge from a medicine standpoint Will continue to follow if he remains admitted  Please reach out with any questions            LOS: 5 days   Time spent: 15 min   Sidney Ace, MD [facility] Pager [phone]  If 7PM-7AM, please contact night-coverage 11/14/2020, 9:26 AM

## 2020-11-14 NOTE — Progress Notes (Addendum)
Occupational Therapy Treatment Patient Details Name: Jesus Bell MRN: [id number] DOB: August 27, 1956 Today's Date: 11/14/2020    History of present illness Pt admitted to [facility] on 11/09/20 for elective L THA revision. He is 80yr s/p L THA. Pt is WBAT on LLE and with posterior hip precautions including: no hip flexion >90deg, no hip IR, and no hip ADD. Significant PMH includes: stroke.   OT comments  Mr Loman was seen for OT treatment on this date. Upon arrival to room pt seated EOB, reporting ready to go home, agreeable to tx. SBA + RW standing grooming tasks. MIN A don sweatpants seated EOB, MAX A don/doff B socks - assist to maintain posterior hip pcns. Pt reports having 24/7 assist from wife at home, reports all her covid symptoms have resolved. PTA in room at end of session to assess stairs. Pt is aware of 3/3 posterior hip pcns and functional application. Pt making good progress toward goals. Pt continues to benefit from skilled OT services to maximize return to PLOF and minimize risk of future falls, injury, caregiver burden, and readmission. Will continue to follow POC. Discharge recommendation updated to reflect pt progress.    Follow Up Recommendations  Home health OT;Supervision/Assistance - 24 hour    Equipment Recommendations  None recommended by OT    Recommendations for Other Services      Precautions / Restrictions Precautions Precautions: Posterior Hip;Fall Restrictions Weight Bearing Restrictions: Yes LLE Weight Bearing: Weight bearing as tolerated       Mobility Bed Mobility               General bed mobility comments: received and left seated EOB    Transfers Overall transfer level: Modified independent Equipment used: Rolling walker (2 wheeled)                  Balance Overall balance assessment: Needs assistance Sitting-balance support: No upper extremity supported;Feet supported Sitting balance-Leahy Scale: Good     Standing balance  support: Bilateral upper extremity supported Standing balance-Leahy Scale: Good                             ADL either performed or assessed with clinical judgement   ADL Overall ADL's : Needs assistance/impaired                                       General ADL Comments: SBA standing grooming tasks. MIN A don sweatpants sseated EOB, MAX A don/doff B socks - assist to maintain posterior hip pcns               Cognition Arousal/Alertness: Awake/alert Behavior During Therapy: WFL for tasks assessed/performed Overall Cognitive Status: Within Functional Limits for tasks assessed                                 General Comments: Pt able to recall 3/3 posterior hip precautions and WB precautions without cues        Exercises Exercises: Other exercises Other Exercises Other Exercises: Pt educated re: OT role, DME recs, d/c recs, falls prevention, HEP, adapted dressing/bathing techniques Other Exercises: LBD, grooming, sit<>stand, sitting/standing balance/tolerance           Pertinent Vitals/ Pain       Pain Assessment: Faces Faces Pain Scale: Hurts  a little bit Pain Location: L hip Pain Descriptors / Indicators: Sore;Discomfort Pain Intervention(s): Limited activity within patient's tolerance         Frequency  Min 2X/week        Progress Toward Goals  OT Goals(current goals can now be found in the care plan section)  Progress towards OT goals: Progressing toward goals  Acute Rehab OT Goals Patient Stated Goal: To return to PLOF and go fishing OT Goal Formulation: With patient Time For Goal Achievement: 11/24/20 Potential to Achieve Goals: Good ADL Goals Pt Will Perform Upper Body Bathing: with modified independence;sitting Pt Will Perform Lower Body Dressing: with mod assist;sit to/from stand;with caregiver independent in assisting Pt Will Transfer to Toilet: with min assist;stand pivot transfer;bedside commode   Plan Discharge plan needs to be updated;Frequency remains appropriate       AM-PAC OT "6 Clicks" Daily Activity     Outcome Measure   Help from another person eating meals?: None Help from another person taking care of personal grooming?: None Help from another person toileting, which includes using toliet, bedpan, or urinal?: A Little Help from another person bathing (including washing, rinsing, drying)?: A Little Help from another person to put on and taking off regular upper body clothing?: A Little Help from another person to put on and taking off regular lower body clothing?: A Little 6 Click Score: 20    End of Session Equipment Utilized During Treatment: Rolling walker  OT Visit Diagnosis: Other abnormalities of gait and mobility (R26.89);Muscle weakness (generalized) (M62.81)   Activity Tolerance Patient tolerated treatment well   Patient Left in bed;with call bell/phone within reach   Nurse Communication          Time: 2321-2347 OT Time Calculation (min): 26 min  Charges: OT General Charges $OT Visit: 1 Visit OT Treatments $Self Care/Home Management : 23-37 mins  Dessie Coma, M.S. OTR/L  11/14/20, 3:29 PM  ascom [phone]

## 2020-11-14 NOTE — Progress Notes (Signed)
  Subjective: 5 Days Post-Op Procedure(s) (LRB): TOTAL HIP REVISION (Left) Patient reports pain as mild.   Patient is well, and has had no acute complaints or problems Plan is to go Home after hospital stay. Patient states he is ready to go home today. Negative for chest pain and shortness of breath Fever: no Gastrointestinal: negative for nausea and vomiting.   Patient has had a bowel movement.  Objective: Vital signs in last 24 hours: Temp:  [97.9 F (36.6 C)-98.7 F (37.1 C)] 98.7 F (37.1 C) (02/23 0739) Pulse Rate:  [74-99] 94 (02/23 0739) Resp:  [15-19] 15 (02/23 0739) BP: (103-126)/(64-77) 126/65 (02/23 0739) SpO2:  [97 %-100 %] 99 % (02/23 0739)  Intake/Output from previous day: No intake or output data in the 24 hours ending 11/14/20 1100  Intake/Output this shift: No intake/output data recorded.  Labs: Recent Labs    11/12/20 0359 11/13/20 0730  HGB 7.9* 8.2*   Recent Labs    11/12/20 0359 11/13/20 0730  WBC 11.9* 10.3  RBC 3.24* 3.37*  HCT 23.1* 23.9*  PLT 278 322   Recent Labs    11/12/20 0359 11/13/20 0730  NA 136 136  K 4.1 3.6  CL 102 101  CO2 27 26  BUN 12 10  CREATININE 0.89 0.87  GLUCOSE 131* 133*  CALCIUM 8.6* 8.8*   No results for input(s): LABPT, INR in the last 72 hours.   EXAM General - Patient is Alert, Appropriate and Oriented Extremity - Neurovascular intact Dorsiflexion/Plantar flexion intact Compartment soft Dressing/Incision -clean, dry, no drainage Motor Function - intact, moving foot and toes well on exam.  Cardiovascular- Regular rate and rhythm, no murmurs/rubs/gallops Respiratory- Lungs clear to auscultation bilaterally Gastrointestinal- soft and nontender   Assessment/Plan: 5 Days Post-Op Procedure(s) (LRB): TOTAL HIP REVISION (Left) Principal Problem:   Tachycardia with heart rate 121-140 beats per minute Active Problems:   Type 2 diabetes mellitus with other specified complication (HCC)   Hypertension    Severe obesity (BMI 35.0-39.9) with comorbidity (HCC)   S/P revision of total hip  Estimated body mass index is 39.02 kg/m as calculated from the following:   Height as of this encounter: 5\' 8"  (1.727 m).   Weight as of this encounter: 116.4 kg. Advance diet Up with therapy Maintain posterior hip precautions.   Will consider discharge based on patient's progress with PT today.   Post-surgical anemia -Hg was 8.2 yesterday. Will continue to monitor.   Covid positive -patient asymptomatic, no intervention per medicine    DVT Prophylaxis - Lovenox, Ted hose and foot pumps Weight-Bearing as tolerated to left leg  Cassell Smiles, PA-C [facility] Orthopaedic Surgery 11/14/2020, 11:00 AM

## 2020-11-14 NOTE — Discharge Summary (Signed)
Physician Discharge Summary  Patient ID: Jesus Bell MRN: [id number] DOB/AGE: [birth year] 65 y.o.  Admit date: 11/09/2020 Discharge date: 11/14/2020  Admission Diagnoses:  S/P revision of total hip [Z96.649]  Surgeries:Procedure(s):  Left total hip revision arthroplasty  SURGEON:  Marciano Sequin. M.D.  ASSISTANT: Cassell Smiles, PA-C (present and scrubbed throughout the case, critical for assistance with exposure, retraction, instrumentation, and closure)  ANESTHESIA: spinal and general  ESTIMATED BLOOD LOSS: 1000 mL  FLUIDS REPLACED: 3000 mL of crystalloid             500 mL of colloid  DRAINS: 2 medium Hemovac drains  IMPLANTS UTILIZED: DePuy 15 mm 10 inch Solution femoral stem, +4 mm neutral 10 degree Marathon polyethylene insert, and a 36 mm M-SPEC -2 mm hip ball Discharge Diagnoses: Patient Active Problem List   Diagnosis Date Noted  . Tachycardia with heart rate 121-140 beats per minute 11/11/2020  . Fatty liver 11/09/2020  . Hardening of the aorta (main artery of the heart) ([place]) 11/09/2020  . Kidney stone 11/09/2020  . Osteoarthritis of hip 11/09/2020  . S/P revision of total hip 11/09/2020  . Benign prostatic hyperplasia 09/01/2020  . Duodenal ulcer 09/01/2020  . ED (erectile dysfunction) of organic origin 09/01/2020  . Gastro-esophageal reflux disease without esophagitis 09/01/2020  . Pure hypercholesterolemia 09/01/2020  . 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 Chronic bilateral low back pain with left-sided sciatica 06/21/2020  . Foraminal stenosis of lumbar region 06/21/2020  . Right lower lobe lung mass 06/13/2020  . Severe obesity (BMI 35.0-39.9) with comorbidity ([place]) 04/09/2020  . Type 2 diabetes mellitus with other specified complication ([place]) 96/75/9163  . Hypertension 08/17/2017  . Status post total replacement of hip 08/17/2017    Past Medical History:  Diagnosis Date  . Chicken pox   . Diabetes mellitus type 2, uncomplicated ([place])   .  Diabetes mellitus without complication ([place])    boarderline  . Erectile dysfunction   . GERD (gastroesophageal reflux disease)   . History of kidney stones   . Hypercholesterolemia   . Hypertension   . Stroke [facility]) 2012   hemorrhagic vessel     Transfusion: n/a   Consultants (if any): Hospitalist service for persistent sinus tachycardia  Discharged Condition: Improved  Hospital Course: Jesus Bell is an 65 y.o. male who was admitted 11/09/2020 with a diagnosis of loose implants s/p left total hip arthroplasty and went to the operating room on 11/09/2020 and underwent left total hip revision arthroplasty. The patient received perioperative antibiotics for prophylaxis (see below). The patient tolerated the procedure well and was transported to PACU in stable condition. After meeting PACU criteria, the patient was subsequently transferred to the Orthopaedics/Rehabilitation unit.   The patient received DVT prophylaxis in the form of early mobilization, Lovenox, Foot Pumps and TED hose. A sacral pad had been placed and heels were elevated off of the bed with rolled towels in order to protect skin integrity. Foley catheter was discontinued on postoperative day #0. Wound drains were discontinued on postoperative day #2. The surgical incision was healing well without signs of infection.  Physical therapy was initiated postoperatively for transfers, gait training, and strengthening. Occupational therapy was initiated for activities of daily living and evaluation for assisted devices. Rehabilitation goals were reviewed in detail with the patient. Patient's post-operative course was complicated by persistent sinus tachycardia for which medicine was consulted. The patient made steady progress with physical therapy and physical therapy recommended discharge to Skilled nursing facility. After considerable  discussion between the patient and surgeon it was felt to be in the patient's best interest to discharge  home. Patient's post-operative course was also complicated by a positive Covid-19 diagnosis, though patient remained asymptomatic.  The patient achieved the preliminary goals of this hospitalization and was felt to be medically and orthopaedically appropriate for discharge.  He was given perioperative antibiotics:  Anti-infectives (From admission, onward)   Start     Dose/Rate Route Frequency Ordered Stop   11/14/20 1000  remdesivir 100 mg in sodium chloride 0.9 % 100 mL IVPB       "Followed by" Linked Group Details   100 mg 200 mL/hr over 30 Minutes Intravenous Daily 11/13/20 0250 11/16/20 0959   11/13/20 0345  remdesivir 200 mg in sodium chloride 0.9% 250 mL IVPB       "Followed by" Linked Group Details   200 mg 580 mL/hr over 30 Minutes Intravenous Once 11/13/20 0250 11/13/20 0638   11/09/20 2200  ceFAZolin (ANCEF) IVPB 2g/100 mL premix        2 g 200 mL/hr over 30 Minutes Intravenous Every 6 hours 11/09/20 2029 11/10/20 0521   11/09/20 0602  ceFAZolin (ANCEF) 2-4 GM/100ML-% IVPB       Note to Pharmacy: Ronnell Freshwater   : cabinet override      11/09/20 0602 11/09/20 0758   11/09/20 0600  ceFAZolin (ANCEF) IVPB 2g/100 mL premix        2 g 200 mL/hr over 30 Minutes Intravenous On call to O.R. 11/08/20 2226 11/09/20 1550    .  Recent vital signs:  Vitals:   11/14/20 0739 11/14/20 1124  BP: 126/65 127/68  Pulse: 94 66  Resp: 15 16  Temp: 98.7 F (37.1 C) 98.2 F (36.8 C)  SpO2: 99% 98%    Recent laboratory studies:  Recent Labs    11/12/20 0359 11/13/20 0730  WBC 11.9* 10.3  HGB 7.9* 8.2*  HCT 23.1* 23.9*  PLT 278 322  K 4.1 3.6  CL 102 101  CO2 27 26  BUN 12 10  CREATININE 0.89 0.87  GLUCOSE 131* 133*  CALCIUM 8.6* 8.8*    Diagnostic Studies: CT ANGIO CHEST PE W OR WO CONTRAST  Result Date: 11/11/2020 CLINICAL DATA:  Recent hip surgery with increased heart rate and dizziness. EXAM: CT ANGIOGRAPHY CHEST WITH CONTRAST TECHNIQUE: Multidetector CT imaging of the  chest was performed using the standard protocol during bolus administration of intravenous contrast. Multiplanar CT image reconstructions and MIPs were obtained to evaluate the vascular anatomy. CONTRAST:  146mL OMNIPAQUE IOHEXOL 350 MG/ML SOLN COMPARISON:  05/23/2020. FINDINGS: Cardiovascular: The heart size is normal. No substantial pericardial effusion. No thoracic aortic aneurysm. There is no filling defect within the opacified pulmonary arteries to suggest the presence of an acute pulmonary embolus. Mediastinum/Nodes: No mediastinal lymphadenopathy. There is no hilar lymphadenopathy. The esophagus has normal imaging features. There is no axillary lymphadenopathy. Stable 3.2 cm right thyroid nodule. Lungs/Pleura: 4 mm right perifissural nodule on image 41/6 is unchanged in the interval. 5 mm right middle lobe nodule on 47/6 is stable. Multi lobular lesion posterior right lower lobe measured previously at 2.0 x 1.8 cm measures 2.1 x 1.3 cm today (image 60/series 6) but is differentially position due to adjacent atelectasis. Chronic atelectasis noted medial left lower lobe scattered tiny left upper lobe pulmonary nodules are stable Upper Abdomen: The liver shows diffusely decreased attenuation suggesting fat deposition. Subcapsular fatty sparing noted along the gallbladder fossa. Musculoskeletal: No worrisome lytic or  sclerotic osseous abnormality. Review of the MIP images confirms the above findings. IMPRESSION: 1. No CT evidence for acute pulmonary embolus. 2. Stable bilateral pulmonary nodules including 2.1 cm posterior right lower lobe nodule evaluated by PET-CT on 06/12/2020. 3. Hepatic steatosis. 4. 3.2 cm right thyroid nodule, stable since 05/23/2020. Recommend thyroid US (ref: J Am Coll Radiol. 2015 Feb;12(2): 143-50). Electronically Signed   By: Misty Stanley M.D.   On: 11/11/2020 13:20   DG CHEST PORT 1 VIEW  Result Date: 11/11/2020 CLINICAL DATA:  Tachycardia. EXAM: PORTABLE CHEST 1 VIEW COMPARISON:   None. FINDINGS: 1002 hours. Low lung volumes. The cardio pericardial silhouette is enlarged. There is pulmonary vascular congestion without overt pulmonary edema. No edema or focal airspace consolidation. No substantial pleural effusion. The visualized bony structures of the thorax show no acute abnormality. IMPRESSION: Low lung volumes with vascular congestion. Electronically Signed   By: Misty Stanley M.D.   On: 11/11/2020 10:27   ECHOCARDIOGRAM COMPLETE  Result Date: 11/12/2020    ECHOCARDIOGRAM REPORT   Patient Name:   Jesus Bell Date of Exam: 11/12/2020 Medical Rec #:  [id number]      Height:       68.0 in Accession #:    [id number]     Weight:       256.6 lb Date of Birth:  03-Apr-1956      BSA:          2.272 m Patient Age:    52 years       BP:           127/83 mmHg Patient Gender: M              HR:           109 bpm. Exam Location:  [facility] Procedure: 2D Echo, Color Doppler and Cardiac Doppler Indications:     SBE 421.0 / I28.8  History:         Patient has no prior history of Echocardiogram examinations.                  Stroke; Risk Factors:Hypertension and Diabetes.  Sonographer:     Sherrie Sport RDCS (AE) Referring Phys:  [id number] Collier Bullock Diagnosing Phys: Kathlyn Sacramento MD  Sonographer Comments: Suboptimal apical window and no subcostal window. IMPRESSIONS  1. Left ventricular ejection fraction, by estimation, is 60 to 65%. The left ventricle has normal function. The left ventricle has no regional wall motion abnormalities. There is mild left ventricular hypertrophy. Left ventricular diastolic parameters are consistent with Grade I diastolic dysfunction (impaired relaxation).  2. Right ventricular systolic function is normal. The right ventricular size is normal. Tricuspid regurgitation signal is inadequate for assessing PA pressure.  3. The mitral valve is normal in structure. No evidence of mitral valve regurgitation. No evidence of mitral stenosis.  4. The aortic valve is normal in structure.  Aortic valve regurgitation is not visualized. Mild aortic valve sclerosis is present, with no evidence of aortic valve stenosis. Conclusion(s)/Recommendation(s): No evidence of valvular vegetations on this transthoracic echocardiogram. No intracardiac source of embolism detected on this transthoracic study. FINDINGS  Left Ventricle: Left ventricular ejection fraction, by estimation, is 60 to 65%. The left ventricle has normal function. The left ventricle has no regional wall motion abnormalities. The left ventricular internal cavity size was normal in size. There is  mild left ventricular hypertrophy. Left ventricular diastolic parameters are consistent with Grade I diastolic dysfunction (impaired relaxation). Right Ventricle: The right ventricular size  is normal. No increase in right ventricular wall thickness. Right ventricular systolic function is normal. Tricuspid regurgitation signal is inadequate for assessing PA pressure. Left Atrium: Left atrial size was normal in size. Right Atrium: Right atrial size was normal in size. Pericardium: There is no evidence of pericardial effusion. Mitral Valve: The mitral valve is normal in structure. No evidence of mitral valve regurgitation. No evidence of mitral valve stenosis. Tricuspid Valve: The tricuspid valve is normal in structure. Tricuspid valve regurgitation is not demonstrated. No evidence of tricuspid stenosis. Aortic Valve: The aortic valve is normal in structure. Aortic valve regurgitation is not visualized. Mild aortic valve sclerosis is present, with no evidence of aortic valve stenosis. Aortic valve mean gradient measures 3.0 mmHg. Aortic valve peak gradient measures 6.9 mmHg. Aortic valve area, by VTI measures 4.07 cm. Pulmonic Valve: The pulmonic valve was normal in structure. Pulmonic valve regurgitation is not visualized. No evidence of pulmonic stenosis. Aorta: The aortic root is normal in size and structure. Venous: The inferior vena cava was not well  visualized. IAS/Shunts: No atrial level shunt detected by color flow Doppler.  LEFT VENTRICLE PLAX 2D LVIDd:         4.87 cm  Diastology LVIDs:         2.87 cm  LV e' medial:    5.55 cm/s LV PW:         1.27 cm  LV E/e' medial:  11.6 LV IVS:        1.24 cm  LV e' lateral:   6.42 cm/s LVOT diam:     2.10 cm  LV E/e' lateral: 10.0 LV SV:         68 LV SV Index:   30 LVOT Area:     3.46 cm  RIGHT VENTRICLE RV Basal diam:  2.55 cm RV S prime:     19.90 cm/s TAPSE (M-mode): 4.3 cm LEFT ATRIUM           Index       RIGHT ATRIUM           Index LA diam:      3.60 cm 1.58 cm/m  RA Area:     12.60 cm LA Vol (A2C): 23.2 ml 10.21 ml/m RA Volume:   27.60 ml  12.15 ml/m LA Vol (A4C): 35.0 ml 15.40 ml/m  AORTIC VALVE                   PULMONIC VALVE AV Area (Vmax):    2.91 cm    PV Vmax:        0.39 m/s AV Area (Vmean):   3.08 cm    PV Peak grad:   0.6 mmHg AV Area (VTI):     4.07 cm    RVOT Peak grad: 2 mmHg AV Vmax:           131.00 cm/s AV Vmean:          82.200 cm/s AV VTI:            0.167 m AV Peak Grad:      6.9 mmHg AV Mean Grad:      3.0 mmHg LVOT Vmax:         110.00 cm/s LVOT Vmean:        73.200 cm/s LVOT VTI:          0.196 m LVOT/AV VTI ratio: 1.17  AORTA Ao Root diam: 3.47 cm MITRAL VALVE  TRICUSPID VALVE MV Area (PHT): 3.65 cm    TR Peak grad:   18.0 mmHg MV Decel Time: 208 msec    TR Vmax:        212.00 cm/s MV E velocity: 64.30 cm/s MV A velocity: 93.40 cm/s  SHUNTS MV E/A ratio:  0.69        Systemic VTI:  0.20 m                            Systemic Diam: 2.10 cm Kathlyn Sacramento MD Electronically signed by Kathlyn Sacramento MD Signature Date/Time: 11/12/2020/10:52:08 AM    Final    DG Hip Port Unilat With Pelvis 1V Left  Result Date: 11/09/2020 CLINICAL DATA:  Hip replacement EXAM: DG HIP (WITH OR WITHOUT PELVIS) 1V PORT LEFT COMPARISON:  08/17/2017 FINDINGS: Pubic symphysis and rami appear intact. Status post left hip replacement with revision arthroplasty, lengthening of femoral stem and  trochanteric osteotomy. Surgical drains over the left hip. Gas in the soft tissues consistent with recent surgery. Cerclage wires about the proximal femur. Probable heterotopic bone adjacent to the lesser trochanter. IMPRESSION: Status post left hip replacement with expected postsurgical changes Electronically Signed   By: Donavan Foil M.D.   On: 11/09/2020 18:56    Discharge Medications:   Allergies as of 11/14/2020      Reactions   Nsaids Other (See Comments)   History of hemorrhagic stroke. No nsaids or aspirin   Aspirin Other (See Comments)   States cannot take due to h/o hemorrhagic stroke      Medication List    TAKE these medications   acetaminophen 325 MG tablet Commonly known as: TYLENOL Take 325-650 mg by mouth every 4 (four) hours as needed for mild pain or headache.   amLODipine 10 MG tablet Commonly known as: NORVASC Take 10 mg by mouth daily with breakfast.   enoxaparin 40 MG/0.4ML injection Commonly known as: LOVENOX Inject 0.4 mLs (40 mg total) into the skin daily for 14 days.   lovastatin 40 MG tablet Commonly known as: MEVACOR Take 40 mg by mouth daily.   metFORMIN 1000 MG tablet Commonly known as: GLUCOPHAGE Take 1,000 mg by mouth 2 (two) times daily after a meal.   metoprolol tartrate 25 MG tablet Commonly known as: LOPRESSOR Take 1 tablet (25 mg total) by mouth 2 (two) times daily.   omeprazole 40 MG capsule Commonly known as: PRILOSEC Take 40 mg by mouth daily.   oxyCODONE 5 MG immediate release tablet Commonly known as: Oxy IR/ROXICODONE Take 1-2 tablets (5-10 mg total) by mouth every 4 (four) hours as needed for moderate pain (pain score 4-6).   sildenafil 100 MG tablet Commonly known as: VIAGRA Take 100 mg by mouth as needed for erectile dysfunction.   traMADol 50 MG tablet Commonly known as: ULTRAM Take 50 mg by mouth daily as needed for pain.   Vitamin D3 125 MCG (5000 UT) Tabs Take 5,000 Units by mouth daily.             Durable Medical Equipment  (From admission, onward)         Start     Ordered   11/09/20 2030  DME Walker rolling  Once       Question:  Patient needs a walker to treat with the following condition  Answer:  S/P total hip arthroplasty   11/09/20 2029   11/09/20 2030  DME Bedside commode  Once  Question:  Patient needs a bedside commode to treat with the following condition  Answer:  S/P total hip arthroplasty   11/09/20 2029          Disposition: Home with home health PT    Contact information for follow-up providers    Hooten, Laurice Record, MD. Go on 12/20/2020.   Specialty: Orthopedic Surgery Why: @ 1:45 PM  Contact information: [place] [id number] [place], PA-C 11/14/2020, 3:24 PM

## 2020-11-14 NOTE — Progress Notes (Signed)
Patient is stable and ready for discharge patient is going home with wife. Patient removed his own IV no issues site CDI. Writer went over discharge paperwork with patient and he verbalized understanding of discharge paperwork. Patient dressed his self with therapy's assistance and belongings packed. Patient is transporting via [place] to wife's home for home.

## 2020-11-15 DIAGNOSIS — K219 Gastro-esophageal reflux disease without esophagitis: Secondary | ICD-10-CM | POA: Diagnosis not present

## 2020-11-15 DIAGNOSIS — Z7984 Long term (current) use of oral hypoglycemic drugs: Secondary | ICD-10-CM | POA: Diagnosis not present

## 2020-11-15 DIAGNOSIS — M48062 Spinal stenosis, lumbar region with neurogenic claudication: Secondary | ICD-10-CM | POA: Diagnosis not present

## 2020-11-15 DIAGNOSIS — K760 Fatty (change of) liver, not elsewhere classified: Secondary | ICD-10-CM | POA: Diagnosis not present

## 2020-11-15 DIAGNOSIS — E7800 Pure hypercholesterolemia, unspecified: Secondary | ICD-10-CM | POA: Diagnosis not present

## 2020-11-15 DIAGNOSIS — Z8673 Personal history of transient ischemic attack (TIA), and cerebral infarction without residual deficits: Secondary | ICD-10-CM | POA: Diagnosis not present

## 2020-11-15 DIAGNOSIS — I10 Essential (primary) hypertension: Secondary | ICD-10-CM | POA: Diagnosis not present

## 2020-11-15 DIAGNOSIS — T84031D Mechanical loosening of internal left hip prosthetic joint, subsequent encounter: Secondary | ICD-10-CM | POA: Diagnosis not present

## 2020-11-15 DIAGNOSIS — I700 Atherosclerosis of aorta: Secondary | ICD-10-CM | POA: Diagnosis not present

## 2020-11-15 DIAGNOSIS — E119 Type 2 diabetes mellitus without complications: Secondary | ICD-10-CM | POA: Diagnosis not present

## 2020-11-15 DIAGNOSIS — D649 Anemia, unspecified: Secondary | ICD-10-CM | POA: Diagnosis not present

## 2020-11-15 DIAGNOSIS — U071 COVID-19: Secondary | ICD-10-CM | POA: Diagnosis not present

## 2020-11-19 DIAGNOSIS — E7800 Pure hypercholesterolemia, unspecified: Secondary | ICD-10-CM | POA: Diagnosis not present

## 2020-11-19 DIAGNOSIS — I10 Essential (primary) hypertension: Secondary | ICD-10-CM | POA: Diagnosis not present

## 2020-11-19 DIAGNOSIS — Z7984 Long term (current) use of oral hypoglycemic drugs: Secondary | ICD-10-CM | POA: Diagnosis not present

## 2020-11-19 DIAGNOSIS — E119 Type 2 diabetes mellitus without complications: Secondary | ICD-10-CM | POA: Diagnosis not present

## 2020-11-19 DIAGNOSIS — K219 Gastro-esophageal reflux disease without esophagitis: Secondary | ICD-10-CM | POA: Diagnosis not present

## 2020-11-19 DIAGNOSIS — M48062 Spinal stenosis, lumbar region with neurogenic claudication: Secondary | ICD-10-CM | POA: Diagnosis not present

## 2020-11-19 DIAGNOSIS — U071 COVID-19: Secondary | ICD-10-CM | POA: Diagnosis not present

## 2020-11-19 DIAGNOSIS — D649 Anemia, unspecified: Secondary | ICD-10-CM | POA: Diagnosis not present

## 2020-11-19 DIAGNOSIS — K760 Fatty (change of) liver, not elsewhere classified: Secondary | ICD-10-CM | POA: Diagnosis not present

## 2020-11-19 DIAGNOSIS — T84031D Mechanical loosening of internal left hip prosthetic joint, subsequent encounter: Secondary | ICD-10-CM | POA: Diagnosis not present

## 2020-11-19 DIAGNOSIS — Z8673 Personal history of transient ischemic attack (TIA), and cerebral infarction without residual deficits: Secondary | ICD-10-CM | POA: Diagnosis not present

## 2020-11-19 DIAGNOSIS — I700 Atherosclerosis of aorta: Secondary | ICD-10-CM | POA: Diagnosis not present

## 2020-11-20 DIAGNOSIS — T84031D Mechanical loosening of internal left hip prosthetic joint, subsequent encounter: Secondary | ICD-10-CM | POA: Diagnosis not present

## 2020-11-21 DIAGNOSIS — K219 Gastro-esophageal reflux disease without esophagitis: Secondary | ICD-10-CM | POA: Diagnosis not present

## 2020-11-21 DIAGNOSIS — K760 Fatty (change of) liver, not elsewhere classified: Secondary | ICD-10-CM | POA: Diagnosis not present

## 2020-11-21 DIAGNOSIS — I10 Essential (primary) hypertension: Secondary | ICD-10-CM | POA: Diagnosis not present

## 2020-11-21 DIAGNOSIS — Z7984 Long term (current) use of oral hypoglycemic drugs: Secondary | ICD-10-CM | POA: Diagnosis not present

## 2020-11-21 DIAGNOSIS — M48062 Spinal stenosis, lumbar region with neurogenic claudication: Secondary | ICD-10-CM | POA: Diagnosis not present

## 2020-11-21 DIAGNOSIS — E119 Type 2 diabetes mellitus without complications: Secondary | ICD-10-CM | POA: Diagnosis not present

## 2020-11-21 DIAGNOSIS — E7800 Pure hypercholesterolemia, unspecified: Secondary | ICD-10-CM | POA: Diagnosis not present

## 2020-11-21 DIAGNOSIS — Z8673 Personal history of transient ischemic attack (TIA), and cerebral infarction without residual deficits: Secondary | ICD-10-CM | POA: Diagnosis not present

## 2020-11-21 DIAGNOSIS — U071 COVID-19: Secondary | ICD-10-CM | POA: Diagnosis not present

## 2020-11-21 DIAGNOSIS — D649 Anemia, unspecified: Secondary | ICD-10-CM | POA: Diagnosis not present

## 2020-11-21 DIAGNOSIS — I700 Atherosclerosis of aorta: Secondary | ICD-10-CM | POA: Diagnosis not present

## 2020-11-21 DIAGNOSIS — T84031D Mechanical loosening of internal left hip prosthetic joint, subsequent encounter: Secondary | ICD-10-CM | POA: Diagnosis not present

## 2020-11-23 DIAGNOSIS — K760 Fatty (change of) liver, not elsewhere classified: Secondary | ICD-10-CM | POA: Diagnosis not present

## 2020-11-23 DIAGNOSIS — Z7984 Long term (current) use of oral hypoglycemic drugs: Secondary | ICD-10-CM | POA: Diagnosis not present

## 2020-11-23 DIAGNOSIS — U071 COVID-19: Secondary | ICD-10-CM | POA: Diagnosis not present

## 2020-11-23 DIAGNOSIS — E7800 Pure hypercholesterolemia, unspecified: Secondary | ICD-10-CM | POA: Diagnosis not present

## 2020-11-23 DIAGNOSIS — T84031D Mechanical loosening of internal left hip prosthetic joint, subsequent encounter: Secondary | ICD-10-CM | POA: Diagnosis not present

## 2020-11-23 DIAGNOSIS — D649 Anemia, unspecified: Secondary | ICD-10-CM | POA: Diagnosis not present

## 2020-11-23 DIAGNOSIS — M48062 Spinal stenosis, lumbar region with neurogenic claudication: Secondary | ICD-10-CM | POA: Diagnosis not present

## 2020-11-23 DIAGNOSIS — Z8673 Personal history of transient ischemic attack (TIA), and cerebral infarction without residual deficits: Secondary | ICD-10-CM | POA: Diagnosis not present

## 2020-11-23 DIAGNOSIS — I700 Atherosclerosis of aorta: Secondary | ICD-10-CM | POA: Diagnosis not present

## 2020-11-23 DIAGNOSIS — K219 Gastro-esophageal reflux disease without esophagitis: Secondary | ICD-10-CM | POA: Diagnosis not present

## 2020-11-23 DIAGNOSIS — E119 Type 2 diabetes mellitus without complications: Secondary | ICD-10-CM | POA: Diagnosis not present

## 2020-11-23 DIAGNOSIS — I10 Essential (primary) hypertension: Secondary | ICD-10-CM | POA: Diagnosis not present

## 2020-11-26 DIAGNOSIS — Z8673 Personal history of transient ischemic attack (TIA), and cerebral infarction without residual deficits: Secondary | ICD-10-CM | POA: Diagnosis not present

## 2020-11-26 DIAGNOSIS — M48062 Spinal stenosis, lumbar region with neurogenic claudication: Secondary | ICD-10-CM | POA: Diagnosis not present

## 2020-11-26 DIAGNOSIS — K219 Gastro-esophageal reflux disease without esophagitis: Secondary | ICD-10-CM | POA: Diagnosis not present

## 2020-11-26 DIAGNOSIS — E7800 Pure hypercholesterolemia, unspecified: Secondary | ICD-10-CM | POA: Diagnosis not present

## 2020-11-26 DIAGNOSIS — E119 Type 2 diabetes mellitus without complications: Secondary | ICD-10-CM | POA: Diagnosis not present

## 2020-11-26 DIAGNOSIS — D649 Anemia, unspecified: Secondary | ICD-10-CM | POA: Diagnosis not present

## 2020-11-26 DIAGNOSIS — I700 Atherosclerosis of aorta: Secondary | ICD-10-CM | POA: Diagnosis not present

## 2020-11-26 DIAGNOSIS — Z7984 Long term (current) use of oral hypoglycemic drugs: Secondary | ICD-10-CM | POA: Diagnosis not present

## 2020-11-26 DIAGNOSIS — T84031D Mechanical loosening of internal left hip prosthetic joint, subsequent encounter: Secondary | ICD-10-CM | POA: Diagnosis not present

## 2020-11-26 DIAGNOSIS — U071 COVID-19: Secondary | ICD-10-CM | POA: Diagnosis not present

## 2020-11-26 DIAGNOSIS — K760 Fatty (change of) liver, not elsewhere classified: Secondary | ICD-10-CM | POA: Diagnosis not present

## 2020-11-26 DIAGNOSIS — I10 Essential (primary) hypertension: Secondary | ICD-10-CM | POA: Diagnosis not present

## 2020-11-28 DIAGNOSIS — E119 Type 2 diabetes mellitus without complications: Secondary | ICD-10-CM | POA: Diagnosis not present

## 2020-11-28 DIAGNOSIS — K219 Gastro-esophageal reflux disease without esophagitis: Secondary | ICD-10-CM | POA: Diagnosis not present

## 2020-11-28 DIAGNOSIS — T84031D Mechanical loosening of internal left hip prosthetic joint, subsequent encounter: Secondary | ICD-10-CM | POA: Diagnosis not present

## 2020-11-28 DIAGNOSIS — D649 Anemia, unspecified: Secondary | ICD-10-CM | POA: Diagnosis not present

## 2020-11-28 DIAGNOSIS — M48062 Spinal stenosis, lumbar region with neurogenic claudication: Secondary | ICD-10-CM | POA: Diagnosis not present

## 2020-11-28 DIAGNOSIS — I700 Atherosclerosis of aorta: Secondary | ICD-10-CM | POA: Diagnosis not present

## 2020-11-28 DIAGNOSIS — I10 Essential (primary) hypertension: Secondary | ICD-10-CM | POA: Diagnosis not present

## 2020-11-28 DIAGNOSIS — Z7984 Long term (current) use of oral hypoglycemic drugs: Secondary | ICD-10-CM | POA: Diagnosis not present

## 2020-11-28 DIAGNOSIS — Z8673 Personal history of transient ischemic attack (TIA), and cerebral infarction without residual deficits: Secondary | ICD-10-CM | POA: Diagnosis not present

## 2020-11-28 DIAGNOSIS — E7800 Pure hypercholesterolemia, unspecified: Secondary | ICD-10-CM | POA: Diagnosis not present

## 2020-11-28 DIAGNOSIS — K760 Fatty (change of) liver, not elsewhere classified: Secondary | ICD-10-CM | POA: Diagnosis not present

## 2020-11-28 DIAGNOSIS — U071 COVID-19: Secondary | ICD-10-CM | POA: Diagnosis not present

## 2020-11-30 DIAGNOSIS — E119 Type 2 diabetes mellitus without complications: Secondary | ICD-10-CM | POA: Diagnosis not present

## 2020-11-30 DIAGNOSIS — Z8673 Personal history of transient ischemic attack (TIA), and cerebral infarction without residual deficits: Secondary | ICD-10-CM | POA: Diagnosis not present

## 2020-11-30 DIAGNOSIS — U071 COVID-19: Secondary | ICD-10-CM | POA: Diagnosis not present

## 2020-11-30 DIAGNOSIS — I10 Essential (primary) hypertension: Secondary | ICD-10-CM | POA: Diagnosis not present

## 2020-11-30 DIAGNOSIS — T84031D Mechanical loosening of internal left hip prosthetic joint, subsequent encounter: Secondary | ICD-10-CM | POA: Diagnosis not present

## 2020-11-30 DIAGNOSIS — K760 Fatty (change of) liver, not elsewhere classified: Secondary | ICD-10-CM | POA: Diagnosis not present

## 2020-11-30 DIAGNOSIS — Z7984 Long term (current) use of oral hypoglycemic drugs: Secondary | ICD-10-CM | POA: Diagnosis not present

## 2020-11-30 DIAGNOSIS — M48062 Spinal stenosis, lumbar region with neurogenic claudication: Secondary | ICD-10-CM | POA: Diagnosis not present

## 2020-11-30 DIAGNOSIS — I700 Atherosclerosis of aorta: Secondary | ICD-10-CM | POA: Diagnosis not present

## 2020-11-30 DIAGNOSIS — K219 Gastro-esophageal reflux disease without esophagitis: Secondary | ICD-10-CM | POA: Diagnosis not present

## 2020-11-30 DIAGNOSIS — D649 Anemia, unspecified: Secondary | ICD-10-CM | POA: Diagnosis not present

## 2020-11-30 DIAGNOSIS — E7800 Pure hypercholesterolemia, unspecified: Secondary | ICD-10-CM | POA: Diagnosis not present

## 2020-12-04 DIAGNOSIS — I700 Atherosclerosis of aorta: Secondary | ICD-10-CM | POA: Diagnosis not present

## 2020-12-04 DIAGNOSIS — M48062 Spinal stenosis, lumbar region with neurogenic claudication: Secondary | ICD-10-CM | POA: Diagnosis not present

## 2020-12-04 DIAGNOSIS — E7800 Pure hypercholesterolemia, unspecified: Secondary | ICD-10-CM | POA: Diagnosis not present

## 2020-12-04 DIAGNOSIS — T84031D Mechanical loosening of internal left hip prosthetic joint, subsequent encounter: Secondary | ICD-10-CM | POA: Diagnosis not present

## 2020-12-04 DIAGNOSIS — K760 Fatty (change of) liver, not elsewhere classified: Secondary | ICD-10-CM | POA: Diagnosis not present

## 2020-12-04 DIAGNOSIS — Z8673 Personal history of transient ischemic attack (TIA), and cerebral infarction without residual deficits: Secondary | ICD-10-CM | POA: Diagnosis not present

## 2020-12-04 DIAGNOSIS — Z7984 Long term (current) use of oral hypoglycemic drugs: Secondary | ICD-10-CM | POA: Diagnosis not present

## 2020-12-04 DIAGNOSIS — U071 COVID-19: Secondary | ICD-10-CM | POA: Diagnosis not present

## 2020-12-04 DIAGNOSIS — D649 Anemia, unspecified: Secondary | ICD-10-CM | POA: Diagnosis not present

## 2020-12-04 DIAGNOSIS — I10 Essential (primary) hypertension: Secondary | ICD-10-CM | POA: Diagnosis not present

## 2020-12-04 DIAGNOSIS — K219 Gastro-esophageal reflux disease without esophagitis: Secondary | ICD-10-CM | POA: Diagnosis not present

## 2020-12-04 DIAGNOSIS — E119 Type 2 diabetes mellitus without complications: Secondary | ICD-10-CM | POA: Diagnosis not present

## 2020-12-06 DIAGNOSIS — E7800 Pure hypercholesterolemia, unspecified: Secondary | ICD-10-CM | POA: Diagnosis not present

## 2020-12-06 DIAGNOSIS — U071 COVID-19: Secondary | ICD-10-CM | POA: Diagnosis not present

## 2020-12-06 DIAGNOSIS — I700 Atherosclerosis of aorta: Secondary | ICD-10-CM | POA: Diagnosis not present

## 2020-12-06 DIAGNOSIS — T84031D Mechanical loosening of internal left hip prosthetic joint, subsequent encounter: Secondary | ICD-10-CM | POA: Diagnosis not present

## 2020-12-06 DIAGNOSIS — D649 Anemia, unspecified: Secondary | ICD-10-CM | POA: Diagnosis not present

## 2020-12-06 DIAGNOSIS — Z7984 Long term (current) use of oral hypoglycemic drugs: Secondary | ICD-10-CM | POA: Diagnosis not present

## 2020-12-06 DIAGNOSIS — M48062 Spinal stenosis, lumbar region with neurogenic claudication: Secondary | ICD-10-CM | POA: Diagnosis not present

## 2020-12-06 DIAGNOSIS — Z8673 Personal history of transient ischemic attack (TIA), and cerebral infarction without residual deficits: Secondary | ICD-10-CM | POA: Diagnosis not present

## 2020-12-06 DIAGNOSIS — K219 Gastro-esophageal reflux disease without esophagitis: Secondary | ICD-10-CM | POA: Diagnosis not present

## 2020-12-06 DIAGNOSIS — E119 Type 2 diabetes mellitus without complications: Secondary | ICD-10-CM | POA: Diagnosis not present

## 2020-12-06 DIAGNOSIS — I10 Essential (primary) hypertension: Secondary | ICD-10-CM | POA: Diagnosis not present

## 2020-12-06 DIAGNOSIS — K760 Fatty (change of) liver, not elsewhere classified: Secondary | ICD-10-CM | POA: Diagnosis not present

## 2020-12-11 DIAGNOSIS — E1169 Type 2 diabetes mellitus with other specified complication: Secondary | ICD-10-CM | POA: Diagnosis not present

## 2020-12-11 DIAGNOSIS — I10 Essential (primary) hypertension: Secondary | ICD-10-CM | POA: Diagnosis not present

## 2020-12-11 DIAGNOSIS — M169 Osteoarthritis of hip, unspecified: Secondary | ICD-10-CM | POA: Diagnosis not present

## 2020-12-11 DIAGNOSIS — E7800 Pure hypercholesterolemia, unspecified: Secondary | ICD-10-CM | POA: Diagnosis not present

## 2020-12-11 DIAGNOSIS — K219 Gastro-esophageal reflux disease without esophagitis: Secondary | ICD-10-CM | POA: Diagnosis not present

## 2020-12-17 ENCOUNTER — Other Ambulatory Visit: Payer: Self-pay

## 2020-12-17 ENCOUNTER — Ambulatory Visit
Admission: RE | Admit: 2020-12-17 | Discharge: 2020-12-17 | Disposition: A | Discharge status: Home / Self Care | Payer: Medicare Other | Source: Ambulatory Visit | Attending: Internal Medicine | Admitting: Internal Medicine

## 2020-12-17 ENCOUNTER — Inpatient Hospital Stay: Payer: Medicare Other | Attending: Internal Medicine

## 2020-12-17 DIAGNOSIS — E7800 Pure hypercholesterolemia, unspecified: Secondary | ICD-10-CM | POA: Insufficient documentation

## 2020-12-17 DIAGNOSIS — E119 Type 2 diabetes mellitus without complications: Secondary | ICD-10-CM | POA: Insufficient documentation

## 2020-12-17 DIAGNOSIS — R911 Solitary pulmonary nodule: Secondary | ICD-10-CM | POA: Insufficient documentation

## 2020-12-17 DIAGNOSIS — I10 Essential (primary) hypertension: Secondary | ICD-10-CM | POA: Insufficient documentation

## 2020-12-17 DIAGNOSIS — Z79899 Other long term (current) drug therapy: Secondary | ICD-10-CM | POA: Insufficient documentation

## 2020-12-17 DIAGNOSIS — K862 Cyst of pancreas: Secondary | ICD-10-CM | POA: Insufficient documentation

## 2020-12-17 DIAGNOSIS — Z8673 Personal history of transient ischemic attack (TIA), and cerebral infarction without residual deficits: Secondary | ICD-10-CM | POA: Insufficient documentation

## 2020-12-17 DIAGNOSIS — Z7901 Long term (current) use of anticoagulants: Secondary | ICD-10-CM | POA: Insufficient documentation

## 2020-12-17 DIAGNOSIS — N281 Cyst of kidney, acquired: Secondary | ICD-10-CM | POA: Diagnosis not present

## 2020-12-17 DIAGNOSIS — E041 Nontoxic single thyroid nodule: Secondary | ICD-10-CM | POA: Insufficient documentation

## 2020-12-17 DIAGNOSIS — Z7984 Long term (current) use of oral hypoglycemic drugs: Secondary | ICD-10-CM | POA: Insufficient documentation

## 2020-12-17 MED ORDER — GADOBUTROL 1 MMOL/ML IV SOLN
10.0000 mL | Freq: Once | INTRAVENOUS | Status: AC | PRN
  Administered 2020-12-17: 10 mL via INTRAVENOUS

## 2020-12-18 ENCOUNTER — Inpatient Hospital Stay: Payer: Medicare Other | Admitting: Internal Medicine

## 2020-12-18 ENCOUNTER — Encounter: Payer: Self-pay | Admitting: Internal Medicine

## 2020-12-18 VITALS — BP 153/88 | HR 99 | Temp 96.7°F | Resp 18 | Wt 236.7 lb

## 2020-12-18 DIAGNOSIS — K862 Cyst of pancreas: Secondary | ICD-10-CM | POA: Insufficient documentation

## 2020-12-18 DIAGNOSIS — E7800 Pure hypercholesterolemia, unspecified: Secondary | ICD-10-CM | POA: Diagnosis not present

## 2020-12-18 DIAGNOSIS — E041 Nontoxic single thyroid nodule: Secondary | ICD-10-CM | POA: Diagnosis not present

## 2020-12-18 DIAGNOSIS — E119 Type 2 diabetes mellitus without complications: Secondary | ICD-10-CM | POA: Diagnosis not present

## 2020-12-18 DIAGNOSIS — Z79899 Other long term (current) drug therapy: Secondary | ICD-10-CM | POA: Diagnosis not present

## 2020-12-18 DIAGNOSIS — R911 Solitary pulmonary nodule: Secondary | ICD-10-CM | POA: Diagnosis not present

## 2020-12-18 DIAGNOSIS — I10 Essential (primary) hypertension: Secondary | ICD-10-CM | POA: Diagnosis not present

## 2020-12-18 DIAGNOSIS — Z7984 Long term (current) use of oral hypoglycemic drugs: Secondary | ICD-10-CM | POA: Diagnosis not present

## 2020-12-18 DIAGNOSIS — Z7901 Long term (current) use of anticoagulants: Secondary | ICD-10-CM | POA: Diagnosis not present

## 2020-12-18 DIAGNOSIS — Z8673 Personal history of transient ischemic attack (TIA), and cerebral infarction without residual deficits: Secondary | ICD-10-CM | POA: Diagnosis not present

## 2020-12-18 NOTE — Assessment & Plan Note (Addendum)
#   Incidental PET [sep 2021] scan findings of -peripancreatic/close to duodenum 2.9 cm lymph node [SUV 6.3] mild uptake ~1.2 cm mass[ SUV 5.9 on pancreatic head.  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  Recommend MRI in 12 months.  #Incidental thyroid nodule- 3.2 cm right thyroid nodule, stable since 05/23/2020; recommend evaluation with ENT.  We will make a referral.  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 FEB 2022- CT 2.1 cm posterior right lower lobe nodule-stable.  Recommend reimaging in 12 months.  #  DISPOSITION: # referral to [facility] re: Thyroid nodule # Follow up in 12 months- MD; labs- cbc/cmp; ca-19-9; prior- CT chest; MRI abdomen-  Dr.B  # I reviewed the blood work- with the patient in detail; also reviewed the imaging independently [as summarized above]; and with the patient in detail.    Cc; Dr.Brandon/Dr.Hussain

## 2020-12-18 NOTE — Progress Notes (Signed)
Pt in for follow up, states had left hip replacement surgery 5 weeks ago and doing well.

## 2020-12-18 NOTE — Progress Notes (Signed)
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  Small pulmonary nodules-  # Peri-Pancreatic mass 2.9 x 1.7 [SUV 6.3]/duodenum-? 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     HISTORY OF PRESENTING ILLNESS:  Jesus Bell 65 y.o.  male NO history of smoking; solitary lung nodule; and also pancreatic cyst is here for follow-up.  Patient had a recent left hip surgery; patient recuperating well.  Denies any worsening pain.  Denies any worsening shortness of breath or cough denies abdominal pain pain or nausea vomiting.  Review of Systems  Constitutional: Negative for chills, diaphoresis, fever, malaise/fatigue and weight loss.  HENT: Negative for nosebleeds and sore throat.   Eyes: Negative for double vision.  Respiratory: Negative for cough, hemoptysis, sputum production, shortness of breath and wheezing.   Cardiovascular: Negative for chest pain, palpitations, orthopnea and leg swelling.  Gastrointestinal: Negative for abdominal pain, blood in stool, constipation, diarrhea, heartburn, melena, nausea and vomiting.  Genitourinary: Negative for dysuria, frequency and urgency.  Musculoskeletal: Positive for back pain and joint pain.  Skin: Negative.  Negative for itching and rash.  Neurological: Negative for dizziness, tingling, focal weakness, weakness and headaches.  Endo/Heme/Allergies: Does not bruise/bleed easily.   Psychiatric/Behavioral: Negative for depression. The patient is not nervous/anxious and does not have insomnia.      MEDICAL HISTORY:  Past Medical History:  Diagnosis Date  . Chicken pox   . Diabetes mellitus type 2, uncomplicated ([place])   . Diabetes mellitus without complication ([place])    boarderline  . Erectile dysfunction   . GERD (gastroesophageal reflux disease)   . History of kidney stones   . Hypercholesterolemia   . Hypertension   . Stroke [facility]) 2012   hemorrhagic vessel    SURGICAL HISTORY: Past Surgical History:  Procedure Laterality Date  . CYSTOSCOPY W/ RETROGRADES  05/21/2020   Procedure: CYSTOSCOPY WITH RETROGRADE PYELOGRAM;  Surgeon: Hollice Espy, MD;  Location: [facility];  Service: Urology;;  . Consuela Mimes W/ URETERAL STENT PLACEMENT Left 07/09/2020   Procedure: CYSTOSCOPY WITH STENT REPLACEMENT;  Surgeon: Hollice Espy, MD;  Location: [facility];  Service: Urology;  Laterality: Left;  . CYSTOSCOPY WITH BIOPSY N/A 06/04/2020   Procedure: CYSTOSCOPY WITH bladder neck BIOPSY;  Surgeon: Hollice Espy, MD;  Location: [facility];  Service: Urology;  Laterality: N/A;  . CYSTOSCOPY/RETROGRADE/URETEROSCOPY Left 07/09/2020   Procedure: CYSTOSCOPY/RETROGRADE/URETEROSCOPY;  Surgeon: Hollice Espy, MD;  Location: [facility];  Service: Urology;  Laterality: Left;  . CYSTOSCOPY/URETEROSCOPY/HOLMIUM LASER/STENT PLACEMENT Left 05/21/2020   Procedure: CYSTOSCOPY/URETEROSCOPY/STENT PLACEMENT;  Surgeon: Hollice Espy, MD;  Location: [facility];  Service: Urology;  Laterality: Left;  . CYSTOSCOPY/URETEROSCOPY/HOLMIUM LASER/STENT PLACEMENT Left 06/04/2020   Procedure: CYSTOSCOPY/URETEROSCOPY/HOLMIUM LASER/STENT Exchange;  Surgeon: Hollice Espy, MD;  Location: [facility];  Service: Urology;  Laterality: Left;  . SHOULDER SURGERY Bilateral    clavicle  . TOTAL HIP ARTHROPLASTY Left 08/17/2017   Procedure: TOTAL HIP ARTHROPLASTY;  Surgeon: Dereck Leep, MD;  Location: [facility];   Service: Orthopedics;  Laterality: Left;  . TOTAL HIP  REVISION Left 11/09/2020   Procedure: TOTAL HIP REVISION;  Surgeon: Dereck Leep, MD;  Location: [facility];  Service: Orthopedics;  Laterality: Left;  . WRIST FUSION Left     SOCIAL HISTORY: Social History   Socioeconomic History  . Marital status: Married    Spouse name: [name]  . Number of children: 4  . Years of education: 82  . Highest education level: High school graduate  Occupational History  . Not on file  Tobacco Use  . Smoking status: Never Smoker  . Smokeless tobacco: Never Used  Vaping Use  . Vaping Use: Never used  Substance and Sexual Activity  . Alcohol use: No  . Drug use: No  . Sexual activity: Not on file  Other Topics Concern  . 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 [place] With home. 4 children.    Social Determinants of Health   Financial Resource Strain: Not on file  Food Insecurity: Not on file  Transportation Needs: Not on file  Physical Activity: Not on file  Stress: Not on file  Social Connections: Not on file  Intimate Partner Violence: Not on file    FAMILY HISTORY: Family History  Adopted: Yes  Family history unknown: Yes    ALLERGIES:  is allergic to nsaids and aspirin.  MEDICATIONS:  Current Outpatient Medications  Medication Sig Dispense Refill  . acetaminophen (TYLENOL) 325 MG tablet Take 325-650 mg by mouth every 4 (four) hours as needed for mild pain or headache.    Marland Kitchen amLODipine (NORVASC) 10 MG tablet Take 10 mg by mouth daily with breakfast.     . Cholecalciferol (VITAMIN D3) 125 MCG (5000 UT) TABS Take 5,000 Units by mouth daily.    Marland Kitchen enoxaparin (LOVENOX) 40 MG/0.4ML injection Inject 0.4 mLs (40 mg total) into the skin daily for 14 days. 5.6 mL 0  . lovastatin (MEVACOR) 40 MG tablet Take 40 mg by mouth daily.     . metFORMIN (GLUCOPHAGE) 1000 MG tablet Take 1,000 mg by  mouth 2 (two) times daily after a meal.     . metoprolol tartrate (LOPRESSOR) 25 MG tablet Take 1 tablet (25 mg total) by mouth 2 (two) times daily. 60 tablet 0  . omeprazole (PRILOSEC) 40 MG capsule Take 40 mg by mouth daily.     Marland Kitchen oxyCODONE (OXY IR/ROXICODONE) 5 MG immediate release tablet Take 1-2 tablets (5-10 mg total) by mouth every 4 (four) hours as needed for moderate pain (pain score 4-6). 30 tablet 0  . sildenafil (VIAGRA) 100 MG tablet Take 100 mg by mouth as needed for erectile dysfunction.     . traMADol (ULTRAM) 50 MG tablet Take 50 mg by mouth daily as needed for pain.     No current facility-administered medications for this visit.      Marland Kitchen  PHYSICAL EXAMINATION: ECOG PERFORMANCE STATUS: 0 - Asymptomatic  Vitals:   12/18/20 1031  BP: (!) 153/88  Pulse: 99  Resp: 18  Temp: (!) 96.7 F (35.9 C)  SpO2: 100%   Filed Weights   12/18/20 1031  Weight: 236 lb 11.2 oz (107.4 kg)    Physical Exam Constitutional:      Comments: Walking with a cane (recent hip surgery); alone.  HENT:     Head: Normocephalic and atraumatic.     Mouth/Throat:     Pharynx: No oropharyngeal exudate.  Eyes:  Pupils: Pupils are equal, round, and reactive to light.  Cardiovascular:     Rate and Rhythm: Normal rate and regular rhythm.  Pulmonary:     Effort: Pulmonary effort is normal. No respiratory distress.     Breath sounds: Normal breath sounds. No wheezing.  Abdominal:     General: Bowel sounds are normal. There is no distension.     Palpations: Abdomen is soft. There is no mass.     Tenderness: There is no abdominal tenderness. There is no guarding or rebound.  Musculoskeletal:        General: No tenderness. Normal range of motion.     Cervical back: Normal range of motion and neck supple.  Skin:    General: Skin is warm.  Neurological:     Mental Status: He is alert and oriented to person, place, and time.  Psychiatric:        Mood and Affect: Affect normal.       LABORATORY DATA:  I have reviewed the data as listed Lab Results  Component Value Date   WBC 10.3 11/13/2020   HGB 8.2 (L) 11/13/2020   HCT 23.9 (L) 11/13/2020   MCV 70.9 (L) 11/13/2020   PLT 322 11/13/2020   Recent Labs    04/19/20 0912 05/04/20 1323 06/13/20 1500 07/05/20 0908 10/30/20 1009 11/11/20 0843 11/12/20 0359 11/13/20 0730  NA 142   < > 139  --  140 136 136 136  K 3.9  --  3.2*   < > 3.6 3.3* 4.1 3.6  CL 102  --  102  --  103 101 102 101  CO2 26  --  26  --  27 25 27 26   GLUCOSE 133*   < > 189*  --  124* 145* 131* 133*  BUN 9   < > 10  --  10 10 12 10   CREATININE 1.04   < > 1.03  --  0.91 0.87 0.89 0.87  CALCIUM 9.7  --  9.4  --  9.8 8.6* 8.6* 8.8*  GFRNONAA 76  --  >60   < > >60 >60 >60 >60  GFRAA 88  --  >60  --   --   --   --   --   PROT  --    < > 7.5  --  7.6  --  6.1* 6.3*  ALBUMIN  --    < > 4.0  --  4.1  --  2.9* 3.0*  AST  --    < > 22  --  19  --  32 31  ALT  --    < > 15  --  15  --  9 15  ALKPHOS  --    < > 103  --  107  --  67 68  BILITOT  --    < > 0.6  --  1.0  --  0.9 0.8   < > = values in this interval not displayed.    RADIOGRAPHIC STUDIES: I have personally reviewed the radiological images as listed and agreed with the findings in the report. MR Abdomen W Wo Contrast  Result Date: 12/17/2020 CLINICAL DATA:  Follow-up pancreatic cyst EXAM: MRI ABDOMEN WITHOUT AND WITH CONTRAST TECHNIQUE: Multiplanar multisequence MR imaging of the abdomen was performed both before and after the administration of intravenous contrast. CONTRAST:  63mL GADAVIST GADOBUTROL 1 MMOL/ML IV SOLN COMPARISON:  MRI abdomen 06/15/2020 FINDINGS: Lower chest:  Lung bases are clear. Hepatobiliary: No  focal hepatic lesion. No intrahepatic biliary duct dilatation. Gallbladder and common bile duct normal caliber. Pancreas: Again demonstrated several small cystic lesions in the head of the pancreas. The cysts are unilocular without enhancement. Cyst approximates the  distal pancreatic duct. No clear communication with the duct. Largest cyst measures 10 mm (image 54/series 22) compared to 11 mm on comparison MRI from 06/15/2020. No dilatation of the pancreatic duct. Spleen: Normal spleen. Adrenals/urinary tract: Adrenal glands normal. Several nonenhancing cysts of the LEFT and RIGHT kidneys. Stomach/Bowel: Stomach and limited of the small bowel is unremarkable Vascular/Lymphatic: Abdominal aortic normal caliber. No retroperitoneal periportal lymphadenopathy. Musculoskeletal: No aggressive osseous lesion IMPRESSION: 1. No change in unilocular cystic lesion head of the pancreas. No pancreatic duct dilatation. Differential includes a side branch IPMT versus post inflammatory cystic change. Per consensus criteria, recommend follow-up MRI in 12 months from current month. This recommendation follows ACR consensus guidelines: Management of Incidental Pancreatic Cysts: A White Paper of the ACR Incidental Findings Committee. J Am Coll Radiol 6789;[id number]. 2. Normal biliary tree and liver. Electronically Signed   By: Suzy Bouchard M.D.   On: 12/17/2020 15:30    ASSESSMENT & PLAN:   Pancreas cyst # Incidental PET [sep 0258] scan findings of -peripancreatic/close to duodenum 2.9 cm lymph node [SUV 6.3] mild uptake ~1.2 cm mass[ SUV 5.9 on pancreatic head.  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  Recommend MRI in 12 months.  #Incidental thyroid nodule- 3.2 cm right thyroid nodule, stable since 05/23/2020; recommend evaluation with ENT.  We will make a referral.  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 FEB 2022- CT 2.1 cm posterior right lower lobe nodule-stable.  Recommend reimaging in 12 months.  #  DISPOSITION: # referral to [facility] re: Thyroid nodule # Follow up in 12 months- MD; labs- cbc/cmp; ca-19-9;  prior- CT chest; MRI abdomen-  Dr.B  # I reviewed the blood work- with the patient in detail; also reviewed the imaging independently [as summarized above]; and with the patient in detail.    Cc; Dr.Brandon/Dr.Hussain     All questions were answered. The patient knows to call the clinic with any problems, questions or concerns.    Cammie Sickle, MD 12/18/2020 2:53 PM

## 2020-12-20 DIAGNOSIS — Z96642 Presence of left artificial hip joint: Secondary | ICD-10-CM | POA: Diagnosis not present

## 2021-01-02 DIAGNOSIS — I10 Essential (primary) hypertension: Secondary | ICD-10-CM | POA: Diagnosis not present

## 2021-01-02 DIAGNOSIS — K862 Cyst of pancreas: Secondary | ICD-10-CM | POA: Diagnosis not present

## 2021-01-02 DIAGNOSIS — E041 Nontoxic single thyroid nodule: Secondary | ICD-10-CM | POA: Diagnosis not present

## 2021-01-02 DIAGNOSIS — E7800 Pure hypercholesterolemia, unspecified: Secondary | ICD-10-CM | POA: Diagnosis not present

## 2021-01-02 DIAGNOSIS — I700 Atherosclerosis of aorta: Secondary | ICD-10-CM | POA: Diagnosis not present

## 2021-01-02 DIAGNOSIS — E1169 Type 2 diabetes mellitus with other specified complication: Secondary | ICD-10-CM | POA: Diagnosis not present

## 2021-01-09 DIAGNOSIS — K219 Gastro-esophageal reflux disease without esophagitis: Secondary | ICD-10-CM | POA: Diagnosis not present

## 2021-01-09 DIAGNOSIS — E7800 Pure hypercholesterolemia, unspecified: Secondary | ICD-10-CM | POA: Diagnosis not present

## 2021-01-09 DIAGNOSIS — I10 Essential (primary) hypertension: Secondary | ICD-10-CM | POA: Diagnosis not present

## 2021-01-09 DIAGNOSIS — M169 Osteoarthritis of hip, unspecified: Secondary | ICD-10-CM | POA: Diagnosis not present

## 2021-01-09 DIAGNOSIS — E1169 Type 2 diabetes mellitus with other specified complication: Secondary | ICD-10-CM | POA: Diagnosis not present

## 2021-02-06 DIAGNOSIS — M169 Osteoarthritis of hip, unspecified: Secondary | ICD-10-CM | POA: Diagnosis not present

## 2021-02-06 DIAGNOSIS — E1169 Type 2 diabetes mellitus with other specified complication: Secondary | ICD-10-CM | POA: Diagnosis not present

## 2021-02-06 DIAGNOSIS — K219 Gastro-esophageal reflux disease without esophagitis: Secondary | ICD-10-CM | POA: Diagnosis not present

## 2021-02-06 DIAGNOSIS — E7800 Pure hypercholesterolemia, unspecified: Secondary | ICD-10-CM | POA: Diagnosis not present

## 2021-02-06 DIAGNOSIS — I10 Essential (primary) hypertension: Secondary | ICD-10-CM | POA: Diagnosis not present

## 2021-03-30 DIAGNOSIS — K6289 Other specified diseases of anus and rectum: Secondary | ICD-10-CM | POA: Diagnosis not present

## 2021-04-01 DIAGNOSIS — K611 Rectal abscess: Secondary | ICD-10-CM | POA: Diagnosis not present

## 2021-04-21 DIAGNOSIS — E1169 Type 2 diabetes mellitus with other specified complication: Secondary | ICD-10-CM | POA: Diagnosis not present

## 2021-04-21 DIAGNOSIS — I10 Essential (primary) hypertension: Secondary | ICD-10-CM | POA: Diagnosis not present

## 2021-04-21 DIAGNOSIS — K219 Gastro-esophageal reflux disease without esophagitis: Secondary | ICD-10-CM | POA: Diagnosis not present

## 2021-04-21 DIAGNOSIS — M169 Osteoarthritis of hip, unspecified: Secondary | ICD-10-CM | POA: Diagnosis not present

## 2021-04-21 DIAGNOSIS — E7800 Pure hypercholesterolemia, unspecified: Secondary | ICD-10-CM | POA: Diagnosis not present

## 2021-04-25 ENCOUNTER — Ambulatory Visit: Payer: Medicare Other

## 2021-04-30 ENCOUNTER — Ambulatory Visit: Payer: Self-pay | Admitting: Urology

## 2021-05-07 DIAGNOSIS — Z96642 Presence of left artificial hip joint: Secondary | ICD-10-CM | POA: Diagnosis not present

## 2021-05-10 ENCOUNTER — Ambulatory Visit
Admission: RE | Admit: 2021-05-10 | Discharge: 2021-05-10 | Disposition: A | Discharge status: Home / Self Care | Payer: Medicare Other | Source: Ambulatory Visit | Attending: Urology | Admitting: Urology

## 2021-05-10 ENCOUNTER — Other Ambulatory Visit: Payer: Self-pay

## 2021-05-10 DIAGNOSIS — N1330 Unspecified hydronephrosis: Secondary | ICD-10-CM | POA: Insufficient documentation

## 2021-05-10 DIAGNOSIS — N281 Cyst of kidney, acquired: Secondary | ICD-10-CM | POA: Diagnosis not present

## 2021-05-12 NOTE — Progress Notes (Signed)
05/15/21 9:00 AM   Jesus Bell 25-Apr-1956 [id number]  Referring provider:  Wenda Low, MD [address] [place],  [place] [address] Chief Complaint  Patient presents with   Hydronephrosis     HPI: Jesus Bell is a 65 y.o.male who presents today for 6 month follow-up with RUS.   He was incidentally identified to have hydronephrosis on MRI. CT urogram indicated this was secondary to an obstructing stone.  He underwent multiple staged procedures to address the stone burden given the difficulty in degree of impaction.  Ultimately, the stone was able to be removed and he follows up with renal ultrasound.  On 08/12/2020 he had initial RUS after stent was removed which showed some mild left renal pelvic fullness. Repeat study on 09/24/2020 indicated bilateral ureteral jets. And a larger contralateral stone that on CT was essentially punctate.   Stone composition 100% calcium oxalate monohydrate.  05/10/2021 RUS showed no evidence of stone burden.    Overall he is doing well.  He denies any flank pain or gross hematuria.  He reports his only ailment is getting into a patch of chiggers while hunting.   PMH: Past Medical History:  Diagnosis Date   Chicken pox    Diabetes mellitus type 2, uncomplicated ([place])    Diabetes mellitus without complication ([place])    boarderline   Erectile dysfunction    GERD (gastroesophageal reflux disease)    History of kidney stones    Hypercholesterolemia    Hypertension    Stroke [facility]) 2012   hemorrhagic vessel    Surgical History: Past Surgical History:  Procedure Laterality Date   CYSTOSCOPY W/ RETROGRADES  05/21/2020   Procedure: CYSTOSCOPY WITH RETROGRADE PYELOGRAM;  Surgeon: Hollice Espy, MD;  Location: [facility];  Service: Urology;;   CYSTOSCOPY W/ URETERAL STENT PLACEMENT Left 07/09/2020   Procedure: CYSTOSCOPY WITH STENT REPLACEMENT;  Surgeon: Hollice Espy, MD;  Location: [facility];  Service: Urology;   Laterality: Left;   CYSTOSCOPY WITH BIOPSY N/A 06/04/2020   Procedure: CYSTOSCOPY WITH bladder neck BIOPSY;  Surgeon: Hollice Espy, MD;  Location: [facility];  Service: Urology;  Laterality: N/A;   CYSTOSCOPY/RETROGRADE/URETEROSCOPY Left 07/09/2020   Procedure: CYSTOSCOPY/RETROGRADE/URETEROSCOPY;  Surgeon: Hollice Espy, MD;  Location: [facility];  Service: Urology;  Laterality: Left;   CYSTOSCOPY/URETEROSCOPY/HOLMIUM LASER/STENT PLACEMENT Left 05/21/2020   Procedure: CYSTOSCOPY/URETEROSCOPY/STENT PLACEMENT;  Surgeon: Hollice Espy, MD;  Location: [facility];  Service: Urology;  Laterality: Left;   CYSTOSCOPY/URETEROSCOPY/HOLMIUM LASER/STENT PLACEMENT Left 06/04/2020   Procedure: CYSTOSCOPY/URETEROSCOPY/HOLMIUM LASER/STENT Exchange;  Surgeon: Hollice Espy, MD;  Location: [facility];  Service: Urology;  Laterality: Left;   SHOULDER SURGERY Bilateral    clavicle   TOTAL HIP ARTHROPLASTY Left 08/17/2017   Procedure: TOTAL HIP ARTHROPLASTY;  Surgeon: Dereck Leep, MD;  Location: [facility];  Service: Orthopedics;  Laterality: Left;   TOTAL HIP REVISION Left 11/09/2020   Procedure: TOTAL HIP REVISION;  Surgeon: Dereck Leep, MD;  Location: [facility];  Service: Orthopedics;  Laterality: Left;   WRIST FUSION Left     Home Medications:  Allergies as of 05/15/2021       Reactions   Nsaids Other (See Comments)   History of hemorrhagic stroke. No nsaids or aspirin   Aspirin Other (See Comments)   States cannot take due to h/o hemorrhagic stroke        Medication List        Accurate as of May 15, 2021  9:00 AM. If you have any  questions, ask your nurse or doctor.          STOP taking these medications    enoxaparin 40 MG/0.4ML injection Commonly known as: LOVENOX Stopped by: Hollice Espy, MD   metoprolol tartrate 25 MG tablet Commonly known as: LOPRESSOR Stopped by: Hollice Espy, MD   oxyCODONE 5 MG immediate release tablet Commonly known as: Oxy IR/ROXICODONE Stopped  by: Hollice Espy, MD   traMADol 50 MG tablet Commonly known as: ULTRAM Stopped by: Hollice Espy, MD   Vitamin D3 125 MCG (5000 UT) Tabs Stopped by: Hollice Espy, MD       TAKE these medications    acetaminophen 325 MG tablet Commonly known as: TYLENOL Take 325-650 mg by mouth every 4 (four) hours as needed for mild pain or headache.   amLODipine 10 MG tablet Commonly known as: NORVASC Take 10 mg by mouth daily with breakfast.   lovastatin 40 MG tablet Commonly known as: MEVACOR Take 40 mg by mouth daily.   metFORMIN 1000 MG tablet Commonly known as: GLUCOPHAGE Take 1,000 mg by mouth 2 (two) times daily after a meal.   omeprazole 40 MG capsule Commonly known as: PRILOSEC Take 40 mg by mouth daily.   sildenafil 100 MG tablet Commonly known as: VIAGRA Take 100 mg by mouth as needed for erectile dysfunction.        Allergies:  Allergies  Allergen Reactions   Nsaids Other (See Comments)    History of hemorrhagic stroke. No nsaids or aspirin    Aspirin Other (See Comments)    States cannot take due to h/o hemorrhagic stroke     Family History: Family History  Adopted: Yes  Family history unknown: Yes    Social History:  reports that he has never smoked. He has never used smokeless tobacco. He reports that he does not drink alcohol and does not use drugs.   Physical Exam: BP (!) 146/90   Pulse 70   Ht '5\' 8"'$  (1.727 m)   Wt 235 lb (106.6 kg)   BMI 35.73 kg/m   Constitutional:  Alert and oriented, No acute distress. HEENT: [place] AT, moist mucus membranes.  Trachea midline, no masses. Cardiovascular: No clubbing, cyanosis, or edema. Respiratory: Normal respiratory effort, no increased work of breathing. Skin: No rashes, bruises or suspicious lesions. Neurologic: Grossly intact, no focal deficits, moving all 4 extremities. Psychiatric: Normal mood and affect.  Laboratory Data:  Lab Results  Component Value Date   CREATININE 0.87 11/13/2020      Lab Results  Component Value Date   HGBA1C 7.2 (H) 10/30/2020     Pertinent Imaging: 05/10/2021 RUS   11/2020 MRI   I have personally reviewed the images and agree with radiologist interpretation.    Assessment & Plan:    History of impacted kidney stone/ ureteral stricture  - Imaging reviewed NED of ongoing stone burden or obstruction  -Clinically doing well -High risk for recurrent ureteral stricture given the degree of impaction, will follow nonetheless work-up basis, we have a renal ultrasound in 2 years unless he has symptoms prior  Follow-up in 2 years (or sooner) with RUS   I,Kailey Littlejohn,acting as a scribe for Hollice Espy, MD.,have documented all relevant documentation on the behalf of Hollice Espy, MD,as directed by  Hollice Espy, MD while in the presence of Hollice Espy, MD.  I have reviewed the above documentation for accuracy and completeness, and I agree with the above.   Hollice Espy, MD    [facility]  [address], [place], [place] [address] [phone]

## 2021-05-15 ENCOUNTER — Other Ambulatory Visit: Payer: Self-pay

## 2021-05-15 ENCOUNTER — Encounter: Payer: Self-pay | Admitting: Urology

## 2021-05-15 ENCOUNTER — Ambulatory Visit: Payer: Medicare Other | Admitting: Urology

## 2021-05-15 VITALS — BP 146/90 | HR 70 | Ht 68.0 in | Wt 235.0 lb

## 2021-05-15 DIAGNOSIS — N201 Calculus of ureter: Secondary | ICD-10-CM

## 2021-05-15 DIAGNOSIS — N1330 Unspecified hydronephrosis: Secondary | ICD-10-CM | POA: Diagnosis not present

## 2021-05-20 DIAGNOSIS — I10 Essential (primary) hypertension: Secondary | ICD-10-CM | POA: Diagnosis not present

## 2021-05-20 DIAGNOSIS — K219 Gastro-esophageal reflux disease without esophagitis: Secondary | ICD-10-CM | POA: Diagnosis not present

## 2021-05-20 DIAGNOSIS — E7800 Pure hypercholesterolemia, unspecified: Secondary | ICD-10-CM | POA: Diagnosis not present

## 2021-05-20 DIAGNOSIS — E1169 Type 2 diabetes mellitus with other specified complication: Secondary | ICD-10-CM | POA: Diagnosis not present

## 2021-05-20 DIAGNOSIS — I700 Atherosclerosis of aorta: Secondary | ICD-10-CM | POA: Diagnosis not present

## 2021-05-31 DIAGNOSIS — M76821 Posterior tibial tendinitis, right leg: Secondary | ICD-10-CM | POA: Diagnosis not present

## 2021-06-12 DIAGNOSIS — E119 Type 2 diabetes mellitus without complications: Secondary | ICD-10-CM | POA: Diagnosis not present

## 2021-08-19 DIAGNOSIS — E1169 Type 2 diabetes mellitus with other specified complication: Secondary | ICD-10-CM | POA: Diagnosis not present

## 2021-08-19 DIAGNOSIS — M169 Osteoarthritis of hip, unspecified: Secondary | ICD-10-CM | POA: Diagnosis not present

## 2021-08-19 DIAGNOSIS — I10 Essential (primary) hypertension: Secondary | ICD-10-CM | POA: Diagnosis not present

## 2021-08-19 DIAGNOSIS — K219 Gastro-esophageal reflux disease without esophagitis: Secondary | ICD-10-CM | POA: Diagnosis not present

## 2021-09-05 DIAGNOSIS — M169 Osteoarthritis of hip, unspecified: Secondary | ICD-10-CM | POA: Diagnosis not present

## 2021-09-05 DIAGNOSIS — E1169 Type 2 diabetes mellitus with other specified complication: Secondary | ICD-10-CM | POA: Diagnosis not present

## 2021-09-05 DIAGNOSIS — K862 Cyst of pancreas: Secondary | ICD-10-CM | POA: Diagnosis not present

## 2021-09-05 DIAGNOSIS — K219 Gastro-esophageal reflux disease without esophagitis: Secondary | ICD-10-CM | POA: Diagnosis not present

## 2021-09-05 DIAGNOSIS — I700 Atherosclerosis of aorta: Secondary | ICD-10-CM | POA: Diagnosis not present

## 2021-09-05 DIAGNOSIS — E7800 Pure hypercholesterolemia, unspecified: Secondary | ICD-10-CM | POA: Diagnosis not present

## 2021-09-05 DIAGNOSIS — I10 Essential (primary) hypertension: Secondary | ICD-10-CM | POA: Diagnosis not present

## 2021-09-05 DIAGNOSIS — Z0000 Encounter for general adult medical examination without abnormal findings: Secondary | ICD-10-CM | POA: Diagnosis not present

## 2021-09-05 DIAGNOSIS — Z1389 Encounter for screening for other disorder: Secondary | ICD-10-CM | POA: Diagnosis not present

## 2021-11-26 ENCOUNTER — Other Ambulatory Visit: Payer: Self-pay | Admitting: *Deleted

## 2021-11-26 DIAGNOSIS — R918 Other nonspecific abnormal finding of lung field: Secondary | ICD-10-CM

## 2021-11-26 DIAGNOSIS — K862 Cyst of pancreas: Secondary | ICD-10-CM

## 2021-11-26 DIAGNOSIS — E041 Nontoxic single thyroid nodule: Secondary | ICD-10-CM

## 2021-12-04 ENCOUNTER — Inpatient Hospital Stay: Payer: Medicare Other | Attending: Internal Medicine

## 2021-12-04 ENCOUNTER — Ambulatory Visit
Admission: RE | Admit: 2021-12-04 | Discharge: 2021-12-04 | Disposition: A | Discharge status: Home / Self Care | Payer: Medicare Other | Source: Ambulatory Visit | Attending: Internal Medicine | Admitting: Internal Medicine

## 2021-12-04 ENCOUNTER — Other Ambulatory Visit: Payer: Self-pay

## 2021-12-04 DIAGNOSIS — N281 Cyst of kidney, acquired: Secondary | ICD-10-CM | POA: Diagnosis not present

## 2021-12-04 DIAGNOSIS — R918 Other nonspecific abnormal finding of lung field: Secondary | ICD-10-CM | POA: Diagnosis not present

## 2021-12-04 DIAGNOSIS — E042 Nontoxic multinodular goiter: Secondary | ICD-10-CM | POA: Diagnosis not present

## 2021-12-04 DIAGNOSIS — K862 Cyst of pancreas: Secondary | ICD-10-CM | POA: Diagnosis not present

## 2021-12-04 DIAGNOSIS — K760 Fatty (change of) liver, not elsewhere classified: Secondary | ICD-10-CM | POA: Diagnosis not present

## 2021-12-04 LAB — POCT I-STAT CREATININE: Creatinine, Ser: 0.7 mg/dL (ref 0.61–1.24)

## 2021-12-04 MED ORDER — GADOBUTROL 1 MMOL/ML IV SOLN
10.0000 mL | Freq: Once | INTRAVENOUS | Status: AC | PRN
  Administered 2021-12-04: 10 mL via INTRAVENOUS

## 2021-12-04 MED ORDER — IOHEXOL 300 MG/ML  SOLN
75.0000 mL | Freq: Once | INTRAMUSCULAR | Status: AC | PRN
  Administered 2021-12-04: 75 mL via INTRAVENOUS

## 2021-12-17 ENCOUNTER — Telehealth: Payer: Self-pay | Admitting: *Deleted

## 2021-12-17 ENCOUNTER — Inpatient Hospital Stay: Payer: Medicare Other | Admitting: Nurse Practitioner

## 2021-12-17 DIAGNOSIS — E7800 Pure hypercholesterolemia, unspecified: Secondary | ICD-10-CM | POA: Diagnosis not present

## 2021-12-17 DIAGNOSIS — E1169 Type 2 diabetes mellitus with other specified complication: Secondary | ICD-10-CM | POA: Diagnosis not present

## 2021-12-17 DIAGNOSIS — I10 Essential (primary) hypertension: Secondary | ICD-10-CM | POA: Diagnosis not present

## 2021-12-17 NOTE — Telephone Encounter (Signed)
Patient's phone call returned by RN. Pt stated that he is unable to keep the apt with Ander Purpura, NP today due to other obligations. He requested to discuss his test results via telephone with Dr. Rogue Bussing personally. I explained to him that Dr. B is currently out of the country and would not be back until the week of 12/30/21. I offered to r/s his apts with NP and even schedule a mychart/virtual visit with a provider. Pt declined. He only wants Dr. B to make a return phone call.  ? ? ?He inquired about his test results and plan of care. Results briefly reviewed with patient- appears imaging is currently stable; however as an RN I am not able to provide follow-up recommendations. Discussed that patient needs an apt with a provider to discuss the plan of care. Patient stated that if his scans are stable, he would rather have Dr. B just re-image him in a years time. He sees no urgency in r/s his apts at this time with Dr. Jacinto Reap. ? ?He requested that the phone note be given to Dr. Jacinto Reap when MD returns and asked that Dr. B reach out to him via telephone to discuss test results rather than an in office apt. ? ? ?Apt cnl today with Ander Purpura, NP per his request. ? ? ? ? ?Department: [facility]  ?Provider:   ?Appointment Notes:  ?Pt called asking to reschedule his 1 PM appt  ?Scheduling Notes:  ?  ?----- Message -----  ?From: Betti Cruz, RN  ?Sent: 12/17/2021   9:50 AM EDT  ?To: Scheduling Message Pool  ? ?

## 2021-12-31 ENCOUNTER — Telehealth: Payer: Self-pay | Admitting: Internal Medicine

## 2021-12-31 DIAGNOSIS — E041 Nontoxic single thyroid nodule: Secondary | ICD-10-CM

## 2021-12-31 NOTE — Telephone Encounter (Signed)
I spoke to patient regarding results of the MRI abdomen;-repeat in 1 year CT chest-no further surveillance.  ? ? Recommend ultrasound of the thyroid US. recommend follow-up in 1 to 2 weeks after thyroid ultrasound.  Patient agreement. ? ? ?

## 2022-01-06 ENCOUNTER — Ambulatory Visit: Admission: RE | Admit: 2022-01-06 | Payer: Medicare HMO | Source: Ambulatory Visit

## 2022-01-20 ENCOUNTER — Inpatient Hospital Stay: Payer: Medicare HMO | Admitting: Internal Medicine

## 2022-01-22 ENCOUNTER — Ambulatory Visit
Admission: RE | Admit: 2022-01-22 | Discharge: 2022-01-22 | Disposition: A | Discharge status: Home / Self Care | Payer: Medicare HMO | Source: Ambulatory Visit | Attending: Internal Medicine | Admitting: Internal Medicine

## 2022-01-22 DIAGNOSIS — E041 Nontoxic single thyroid nodule: Secondary | ICD-10-CM | POA: Diagnosis not present

## 2022-01-29 ENCOUNTER — Encounter: Payer: Self-pay | Admitting: Internal Medicine

## 2022-01-29 ENCOUNTER — Inpatient Hospital Stay: Payer: Medicare HMO | Attending: Internal Medicine | Admitting: Internal Medicine

## 2022-01-29 DIAGNOSIS — I10 Essential (primary) hypertension: Secondary | ICD-10-CM | POA: Diagnosis not present

## 2022-01-29 DIAGNOSIS — K862 Cyst of pancreas: Secondary | ICD-10-CM | POA: Insufficient documentation

## 2022-01-29 DIAGNOSIS — R911 Solitary pulmonary nodule: Secondary | ICD-10-CM | POA: Diagnosis not present

## 2022-01-29 DIAGNOSIS — E042 Nontoxic multinodular goiter: Secondary | ICD-10-CM | POA: Diagnosis not present

## 2022-01-29 DIAGNOSIS — E119 Type 2 diabetes mellitus without complications: Secondary | ICD-10-CM | POA: Diagnosis not present

## 2022-01-29 DIAGNOSIS — K760 Fatty (change of) liver, not elsewhere classified: Secondary | ICD-10-CM | POA: Diagnosis not present

## 2022-01-29 NOTE — Progress Notes (Signed)
US results

## 2022-01-29 NOTE — Progress Notes (Signed)
[facility] ?CONSULT NOTE ? ?Patient Care Team: ?Wenda Low, MD as PCP - General (Internal Medicine) ?Clent Jacks, RN as Oncology Nurse Navigator ? ?CHIEF COMPLAINTS/PURPOSE OF CONSULTATION: lung nodule/mass ? 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  Small pulmonary nodules- ? ?# Peri-Pancreatic mass 2.9 x 1.7 [SUV 6.3]/duodenum-? LN; small hypodensity 1.2 cm [SUV 5.8] on the pancreatic head- MRI MARCH 2022-  ? 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 ?# Brain Bleed [New Years Eve 2012; on asprin; [facility]- spontaneous/conservative] ? ?Oncology History  ? No history exists.  ? ? ? ?HISTORY OF PRESENTING ILLNESS:  ?Jesus Bell 66 y.o.  male NO history of smoking; solitary lung nodule; and also pancreatic cyst is here for follow-up/and review imaging-MRI/thyroid: ? ?Denies any worsening shortness of breath or cough denies abdominal pain pain or nausea vomiting. ? ?Review of Systems  ?Constitutional:  Negative for chills, diaphoresis, fever, malaise/fatigue and weight loss.  ?HENT:  Negative for nosebleeds and sore throat.   ?Eyes:  Negative for double vision.  ?Respiratory:  Negative for cough, hemoptysis, sputum production, shortness of breath and wheezing.   ?Cardiovascular:  Negative for chest pain, palpitations, orthopnea and leg swelling.  ?Gastrointestinal:  Negative for abdominal pain, blood in stool, constipation, diarrhea, heartburn, melena, nausea and vomiting.  ?Genitourinary:  Negative for dysuria, frequency and urgency.  ?Musculoskeletal:  Positive for back pain and joint pain.  ?Skin: Negative.  Negative for itching and rash.  ?Neurological:  Negative for dizziness, tingling, focal weakness, weakness and headaches.  ?Endo/Heme/Allergies:  Does not bruise/bleed easily.  ?Psychiatric/Behavioral:  Negative for depression. The patient is  not nervous/anxious and does not have insomnia.    ? ?MEDICAL HISTORY:  ?Past Medical History:  ?Diagnosis Date  ? Chicken pox   ? Diabetes mellitus type 2, uncomplicated ([place])   ? Diabetes mellitus without complication ([place])   ? boarderline  ? Erectile dysfunction   ? GERD (gastroesophageal reflux disease)   ? History of kidney stones   ? Hypercholesterolemia   ? Hypertension   ? Stroke [facility]) 2012  ? hemorrhagic vessel  ? ? ?SURGICAL HISTORY: ?Past Surgical History:  ?Procedure Laterality Date  ? CYSTOSCOPY W/ RETROGRADES  05/21/2020  ? Procedure: CYSTOSCOPY WITH RETROGRADE PYELOGRAM;  Surgeon: Hollice Espy, MD;  Location: [facility] ORS;  Service: Urology;;  ? CYSTOSCOPY W/ URETERAL STENT PLACEMENT Left 07/09/2020  ? Procedure: CYSTOSCOPY WITH STENT REPLACEMENT;  Surgeon: Hollice Espy, MD;  Location: [facility];  Service: Urology;  Laterality: Left;  ? CYSTOSCOPY WITH BIOPSY N/A 06/04/2020  ? Procedure: CYSTOSCOPY WITH bladder neck BIOPSY;  Surgeon: Hollice Espy, MD;  Location: [facility];  Service: Urology;  Laterality: N/A;  ? CYSTOSCOPY/RETROGRADE/URETEROSCOPY Left 07/09/2020  ? Procedure: CYSTOSCOPY/RETROGRADE/URETEROSCOPY;  Surgeon: Hollice Espy, MD;  Location: [facility];  Service: Urology;  Laterality: Left;  ? CYSTOSCOPY/URETEROSCOPY/HOLMIUM LASER/STENT PLACEMENT Left 05/21/2020  ? Procedure: CYSTOSCOPY/URETEROSCOPY/STENT PLACEMENT;  Surgeon: Hollice Espy, MD;  Location: [facility];  Service: Urology;  Laterality: Left;  ? CYSTOSCOPY/URETEROSCOPY/HOLMIUM LASER/STENT PLACEMENT Left 06/04/2020  ? Procedure: CYSTOSCOPY/URETEROSCOPY/HOLMIUM LASER/STENT Exchange;  Surgeon: Hollice Espy, MD;  Location: [facility];  Service: Urology;  Laterality: Left;  ? SHOULDER SURGERY Bilateral   ? clavicle  ? TOTAL HIP ARTHROPLASTY Left 08/17/2017  ? Procedure: TOTAL HIP ARTHROPLASTY;  Surgeon: Dereck Leep, MD;  Location: [facility];  Service: Orthopedics;  Laterality: Left;  ? TOTAL HIP REVISION Left 11/09/2020  ?  Procedure:  TOTAL HIP REVISION;  Surgeon: Dereck Leep, MD;  Location: [facility];  Service: Orthopedics;  Laterality: Left;  ? WRIST FUSION Left   ? ? ?SOCIAL HISTORY: ?Social History  ? ?Socioeconomic History  ? Marital status: Married  ?  Spouse name: [name]  ? Number of children: 4  ? Years of education: 32  ? Highest education level: High school graduate  ?Occupational History  ? Not on file  ?Tobacco Use  ? Smoking status: Never  ? Smokeless tobacco: Never  ?Vaping Use  ? Vaping Use: Never used  ?Substance and Sexual Activity  ? Alcohol use: No  ? Drug use: No  ? Sexual activity: Not on file  ?Other Topics Concern  ? Not on file  ?Social History Narrative  ? No smoking no alcohol; used to work in [employer]; 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 [place] With home. 4 children.   ? ?Social Determinants of Health  ? ?Financial Resource Strain: Not on file  ?Food Insecurity: Not on file  ?Transportation Needs: Not on file  ?Physical Activity: Not on file  ?Stress: Not on file  ?Social Connections: Not on file  ?Intimate Partner Violence: Not on file  ? ? ?FAMILY HISTORY: ?Family History  ?Adopted: Yes  ?Family history unknown: Yes  ? ? ?ALLERGIES:  is allergic to nsaids and aspirin. ? ?MEDICATIONS:  ?Current Outpatient Medications  ?Medication Sig Dispense Refill  ? acetaminophen (TYLENOL) 325 MG tablet Take 325-650 mg by mouth every 4 (four) hours as needed for mild pain or headache.    ? amLODipine (NORVASC) 10 MG tablet Take 10 mg by mouth daily with breakfast.     ? lovastatin (MEVACOR) 40 MG tablet Take 40 mg by mouth daily.     ? metFORMIN (GLUCOPHAGE) 1000 MG tablet Take 1,000 mg by mouth 2 (two) times daily after a meal.     ? omeprazole (PRILOSEC) 40 MG capsule Take 40 mg by mouth daily.     ? sildenafil (VIAGRA) 100 MG tablet Take 100 mg by mouth as needed for erectile dysfunction.     ? ?No current facility-administered medications for this visit.  ? ? ?  ?. ? ?PHYSICAL EXAMINATION: ?ECOG PERFORMANCE  STATUS: 0 - Asymptomatic ? ?Vitals:  ? 01/29/22 1523  ?BP: 135/80  ?Pulse: 86  ?Resp: 16  ?Temp: 98.7 ?F (37.1 ?C)  ? ?Filed Weights  ? 01/29/22 1523  ?Weight: 245 lb (111.1 kg)  ? ? ?Physical Exam ?Constitutional:   ?   Comments: Walking with a cane [recent hip surgery]; alone.  ?HENT:  ?   Head: Normocephalic and atraumatic.  ?   Mouth/Throat:  ?   Pharynx: No oropharyngeal exudate.  ?Eyes:  ?   Pupils: Pupils are equal, round, and reactive to light.  ?Cardiovascular:  ?   Rate and Rhythm: Normal rate and regular rhythm.  ?Pulmonary:  ?   Effort: Pulmonary effort is normal. No respiratory distress.  ?   Breath sounds: Normal breath sounds. No wheezing.  ?Abdominal:  ?   General: Bowel sounds are normal. There is no distension.  ?   Palpations: Abdomen is soft. There is no mass.  ?   Tenderness: There is no abdominal tenderness. There is no guarding or rebound.  ?Musculoskeletal:     ?   General: No tenderness. Normal range of motion.  ?   Cervical back: Normal range of motion and neck supple.  ?Skin: ?   General: Skin is warm.  ?  Neurological:  ?   Mental Status: He is alert and oriented to person, place, and time.  ?Psychiatric:     ?   Mood and Affect: Affect normal.  ? ? ? ?LABORATORY DATA:  ?I have reviewed the data as listed ?Lab Results  ?Component Value Date  ? WBC 10.3 11/13/2020  ? HGB 8.2 (L) 11/13/2020  ? HCT 23.9 (L) 11/13/2020  ? MCV 70.9 (L) 11/13/2020  ? PLT 322 11/13/2020  ? ?Recent Labs  ?  12/04/21 ?0921  ?CREATININE 0.70  ? ? ?RADIOGRAPHIC STUDIES: ?I have personally reviewed the radiological images as listed and agreed with the findings in the report. ?US THYROID ? ?Result Date: 01/22/2022 ?CLINICAL DATA:  Incidentally identified right thyroid nodule EXAM: THYROID ULTRASOUND TECHNIQUE: Ultrasound examination of the thyroid gland and adjacent soft tissues was performed. COMPARISON:  CT chest 12/04/2021 FINDINGS: Parenchymal Echotexture: Mildly heterogenous Isthmus: 0.4 cm Right lobe: 4.7 x 3.3 x  2.9 cm Left lobe: 4.7 x 2.2 x 1.8 cm _________________________________________________________ Estimated total number of nodules >/= 1 cm: 3 Number of spongiform nodules >/=  2 cm not described below (TR1)

## 2022-01-29 NOTE — Progress Notes (Signed)
Patient denies new problems/concerns today.   °

## 2022-01-29 NOTE — Assessment & Plan Note (Addendum)
#   Incidental PET [sep 2021] scan findings of -peripancreatic/close to duodenum 2.9 cm lymph node [SUV 6.3] mild uptake ~1.2 cm mass[ SUV 5.9 on pancreatic head. MARCH 2023-Stable size and appearance of the cystic lesion in the head of the pancreas which is most consistent with side branch IPMN. No new ?suspicious features. Stable enlarged lymph node just to the right of the duodenum. Recommend continued follow-up with MRI in 12 months. ? ?# Incidental thyroid nodule- 3.2 cm right thyroid nodule, stable since 05/23/2020; ultrasound March 2023-no further follow-up recommended.  Left thyroid nodule-1 cm in size recommend annual ultrasound for total of 5 years. [2028] ? 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  Repeat imaging-March 2022-stable unchanged.  Radiologically considered benign.  No further follow-up recommended ? ? ?#Fatty liver: discussed risk of cirrhosis if uncontrolled.  I discussed the importance of weight loss/cutting down processed foods including sodas; better control of his blood pressure and blood sugars.  ? ?#  DISPOSITION: ?# Follow up in 12 months- MD; labs- cbc/cmp; ca-19-9; prior- MRI abdomen;; US thyroid prior--  Dr.B ? ?# I reviewed the blood work- with the patient in detail; also reviewed the imaging independently [as summarized above]; and with the patient in detail.  ?

## 2022-04-10 ENCOUNTER — Emergency Department
Admission: EM | Admit: 2022-04-10 | Discharge: 2022-04-10 | Disposition: A | Discharge status: Home / Self Care | Payer: Medicare HMO | Attending: Emergency Medicine | Admitting: Emergency Medicine

## 2022-04-10 ENCOUNTER — Encounter: Payer: Self-pay | Admitting: Emergency Medicine

## 2022-04-10 ENCOUNTER — Emergency Department: Payer: Medicare HMO

## 2022-04-10 DIAGNOSIS — M25551 Pain in right hip: Secondary | ICD-10-CM | POA: Diagnosis present

## 2022-04-10 DIAGNOSIS — M1611 Unilateral primary osteoarthritis, right hip: Secondary | ICD-10-CM | POA: Insufficient documentation

## 2022-04-10 DIAGNOSIS — I10 Essential (primary) hypertension: Secondary | ICD-10-CM | POA: Diagnosis not present

## 2022-04-10 DIAGNOSIS — M79651 Pain in right thigh: Secondary | ICD-10-CM | POA: Diagnosis not present

## 2022-04-10 DIAGNOSIS — E119 Type 2 diabetes mellitus without complications: Secondary | ICD-10-CM | POA: Insufficient documentation

## 2022-04-10 MED ORDER — CYCLOBENZAPRINE HCL 10 MG PO TABS: 10.0000 mg | ORAL_TABLET | Freq: Three times a day (TID) | ORAL | 0 refills | Status: AC | PRN

## 2022-04-10 MED ORDER — OXYCODONE-ACETAMINOPHEN 5-325 MG PO TABS
1.0000 | ORAL_TABLET | Freq: Once | ORAL | Status: AC
  Administered 2022-04-10: 1 via ORAL
  Filled 2022-04-10: qty 1

## 2022-04-10 MED ORDER — PREDNISONE 10 MG PO TABS: 10.0000 mg | ORAL_TABLET | Freq: Every day | ORAL | 0 refills | Status: AC

## 2022-04-10 NOTE — Discharge Instructions (Addendum)
-  You may take the prednisone for the next 7 days.  Continue to treat pain with acetaminophen as needed.  You may take cyclobenzaprine, though use caution as it may make you drowsy.  -Please follow-up with your orthopedist, as discussed.  -Return to the emergency department anytime if you begin to experience any new or worsening symptoms.

## 2022-04-10 NOTE — ED Triage Notes (Signed)
Pt presents via POV with complaints of right upper leg pain that started yesterday. Hx of hip replacement on the left hip x2. Denies falls, injury or surgery to that extremity.

## 2022-04-10 NOTE — ED Provider Notes (Signed)
[facility] Provider Note    Event Date/Time   First MD Initiated Contact with Patient 04/10/22 2049     (approximate)   History   Chief Complaint Leg Pain   HPI Jesus Bell is a 66 y.o. male, history of hypertension, type 2 diabetes, duodenal ulcers, presents to the emergency department for evaluation of right hip/upper leg pain x3 days.  Denies any recent falls, injuries, or surgeries on that extremity.  He states that it is a constant, nagging pain that worsens when walking.  Denies fever/chills, numbness/tingling in lower extremities, bowel/bladder dysfunction, abdominal pain, flank pain, nausea/vomiting, rash/lesions, dizziness/lightheadedness, or myalgias.   History Limitations: No limitations.        Physical Exam  Triage Vital Signs: ED Triage Vitals  Enc Vitals Group     BP 04/10/22 1904 (!) 156/95     Pulse Rate 04/10/22 1904 91     Resp 04/10/22 1904 18     Temp 04/10/22 1904 98.7 F (37.1 C)     Temp Source 04/10/22 1904 Oral     SpO2 04/10/22 1904 100 %     Weight 04/10/22 1908 246 lb (111.6 kg)     Height 04/10/22 1908 '5\' 8"'$  (1.727 m)     Head Circumference --      Peak Flow --      Pain Score 04/10/22 1903 10     Pain Loc --      Pain Edu? --      Excl. in [place]? --     Most recent vital signs: Vitals:   04/10/22 1904  BP: (!) 156/95  Pulse: 91  Resp: 18  Temp: 98.7 F (37.1 C)  SpO2: 100%    General: Awake, NAD.  Skin: Warm, dry. No rashes or lesions.  Eyes: PERRL. Conjunctivae normal.  CV: Good peripheral perfusion.  Resp: Normal effort.  Abd: Soft, non-tender. No distention.  Neuro: At baseline. No gross neurological deficits.   Focused Exam: No gross deformities to the affected extremity.  No bony tenderness along the right hip or femur.  PMS intact distally.  He is able to ambulate well on his own, though endorses pain with ambulation.  No rash or lesions along the affected site.  Physical Exam    ED  Results / Procedures / Treatments  Labs (all labs ordered are listed, but only abnormal results are displayed) Labs Reviewed - No data to display   EKG N/A.   RADIOLOGY  ED Provider Interpretation: I personally viewed and interpreted these x-rays, no evidence of acute osseous abnormalities on femur.  Right hip x-ray does show moderate hip osteoarthritis, no evidence of fracture or dislocation.   DG Femur Min 2 Views Right  Result Date: 04/10/2022 CLINICAL DATA:  Pain. Right upper leg pain, onset yesterday. No known injury. EXAM: RIGHT FEMUR 2 VIEWS COMPARISON:  None Available. FINDINGS: Cortical margins of the femur are intact. There is no fracture. No focal bone lesion, periosteal reaction or bony destruction. Knee alignment is maintained. Hip assessed on concurrent hip exam, reported separately. Occasional linear soft tissue calcifications medial to the mid femoral shaft IMPRESSION: No acute osseous abnormality or explanation for pain. Electronically Signed   By: Keith Rake M.D.   On: 04/10/2022 19:48   DG Hip Unilat W or Wo Pelvis 2-3 Views Right  Result Date: 04/10/2022 CLINICAL DATA:  Pain, onset yesterday.  No known injury EXAM: DG HIP (WITH OR WITHOUT PELVIS) 2-3V RIGHT COMPARISON:  Pelvis  and left hip radiograph 03/23/2016 FINDINGS: No fracture of the pelvis or right hip. Moderate right hip joint space narrowing with acetabular and femoral head neck spurring. Mild subchondral cystic change. Curvilinear calcification in the soft tissues project over the femoral neck and intertrochanteric region, possible heterotopic calcification from remote prior injury. This is unchanged from prior exam. No evidence of erosion, avascular necrosis, or focal bone lesion. Intact pubic rami. Pubic symphysis is congruent. The sacroiliac joints may be fused. Left hip arthroplasty is partially visualized. IMPRESSION: 1. Moderate right hip osteoarthritis. 2. Curvilinear calcification in the soft tissues  over the right hip is unchanged from 2017 and may represent sequela of remote soft tissue injury. Electronically Signed   By: Keith Rake M.D.   On: 04/10/2022 19:47    PROCEDURES:  Critical Care performed: N/A.  Procedures    MEDICATIONS ORDERED IN ED: Medications  oxyCODONE-acetaminophen (PERCOCET/ROXICET) 5-325 MG per tablet 1 tablet (has no administration in time range)  oxyCODONE-acetaminophen (PERCOCET/ROXICET) 5-325 MG per tablet 1 tablet (1 tablet Oral Given 04/10/22 1908)     IMPRESSION / MDM / ASSESSMENT AND PLAN / ED COURSE  I reviewed the triage vital signs and the nursing notes.                              Differential diagnosis includes, but is not limited to, hip fracture, hip dislocation, sciatica, osteoarthritis, gout, avascular necrosis, piriformis syndrome.  Assessment/Plan Patient presents with right hip pain x4 days.  Physical exam overall unimpressive.  No evidence of radicular or sciatic type pain.  No evidence of infectious pathology.  X-ray shows no evidence of fractures or dislocations, though does show evidence of moderate hip osteoarthritis.  I suspect this is likely the etiology of the patient's pain.  He is unable to take NSAIDs due to previous hemorrhagic stroke.  We will provide him with a prescription for cyclobenzaprine and short term prednisone.  Encouraged him to follow-up with his orthopedist.  We will plan to discharge.  Patient's presentation is most consistent with acute complicated illness / injury requiring diagnostic workup.   Provided the patient with anticipatory guidance, return precautions, and educational material. Encouraged the patient to return to the emergency department at any time if they begin to experience any new or worsening symptoms. Patient expressed understanding and agreed with the plan.       FINAL CLINICAL IMPRESSION(S) / ED DIAGNOSES   Final diagnoses:  Osteoarthritis of right hip, unspecified osteoarthritis  type     Rx / DC Orders   ED Discharge Orders          Ordered    cyclobenzaprine (FLEXERIL) 10 MG tablet  3 times daily PRN        04/10/22 2045    predniSONE (DELTASONE) 10 MG tablet  Daily        04/10/22 2045             Note:  This document was prepared using Dragon voice recognition software and may include unintentional dictation errors.   Teodoro Spray, [place] 04/10/22 2058    Lucrezia Starch, MD 04/10/22 [phone]

## 2022-10-09 ENCOUNTER — Ambulatory Visit
Admission: RE | Admit: 2022-10-09 | Discharge: 2022-10-09 | Disposition: A | Discharge status: Home / Self Care | Payer: Medicare HMO | Source: Ambulatory Visit | Attending: Family Medicine | Admitting: Family Medicine

## 2022-10-09 ENCOUNTER — Other Ambulatory Visit: Payer: Self-pay | Admitting: Family Medicine

## 2022-10-09 DIAGNOSIS — M1712 Unilateral primary osteoarthritis, left knee: Secondary | ICD-10-CM

## 2023-01-23 ENCOUNTER — Ambulatory Visit
Admission: RE | Admit: 2023-01-23 | Discharge: 2023-01-23 | Disposition: A | Discharge status: Home / Self Care | Payer: Medicare HMO | Source: Ambulatory Visit | Attending: Internal Medicine | Admitting: Internal Medicine

## 2023-01-23 DIAGNOSIS — K862 Cyst of pancreas: Secondary | ICD-10-CM

## 2023-01-23 MED ORDER — GADOBUTROL 1 MMOL/ML IV SOLN
10.0000 mL | Freq: Once | INTRAVENOUS | Status: AC | PRN
  Administered 2023-01-23: 10 mL via INTRAVENOUS

## 2023-01-28 ENCOUNTER — Telehealth: Payer: Self-pay | Admitting: *Deleted

## 2023-01-28 NOTE — Assessment & Plan Note (Addendum)
#   Incidental PET [sep 2021] scan findings of -peripancreatic/close to duodenum 2.9 cm lymph node [SUV 6.3] mild uptake ~1.2 cm mass[ SUV 5.9 on pancreatic head. MARCH 2023-Stable size and appearance of the cystic lesion in the head of the pancreas which is most consistent with side branch IPMN. No new suspicious features. Stable enlarged lymph node just to the right of the duodenum. MAY 8th, 2024- MRI- Unchanged lobulated fluid signal cystic lesion of the central pancreatic head measuring 1.4 x 1.2 cm. This is consistent with a side branch IPMN, and given well-established long-term stability, no further follow-up or characterization is required.  # Incidental May 2024 MRI abdomen-showed multiple contrast enhancing endoluminal masses and nodules of the gastric mucosa, largest of the posterior gastric body measuring 2.5 x 2.4 cm. Findings are highly concerning for malignancy.  I would recommend endoscopic evaluation; will make referral for EUS. June 21st for colonoscopy - [facility] Dr.Locklear/ Mr.Croley.   # Microcytic anemia-highly suspicious of iron deficiency; check iron studies ferritin.  Hold off oral iron-pending EGD/EUS/colonoscopy.  Today hemoglobin is 12 overall improved since a year ago.  # Incidental thyroid nodule- 3.2 cm right thyroid nodule, stable since 05/23/2020; ultrasound March 2023-no further follow-up recommended.  Left thyroid nodule-1 cm in size recommend annual ultrasound for total of 5 years. [2028]-  MAY 2024- Thyroid US- Stable size of 1.1 cm left inferior thyroid nodule (nodule 2) which shows slight change in some characteristics but still falls under same categorization for 1 year follow-up ultrasound.  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  Repeat imaging-March 2022-stable unchanged.  Radiologically considered benign.  No further follow up is recommended- stable.   #Fatty  liver: Incidental MRI finding of hepatic steatosis.discussed risk of cirrhosis if uncontrolled.  I discussed the importance of weight loss/cutting down processed foods including sodas; better control of his blood pressure and blood sugars.   #  DISPOSITION: # Follow up TBD- D.B   # I reviewed the blood work- with the patient in detail; also reviewed the imaging independently [as summarized above]; and with the patient in detail.   Dedicated senior care: [phone]

## 2023-01-28 NOTE — Telephone Encounter (Signed)
Results called from [facility] for impression #1.   IMPRESSION: 1. Unchanged lobulated fluid signal cystic lesion of the central pancreatic head measuring 1.4 x 1.2 cm. This as previously reported appears to communicate directly with the main pancreatic duct and there is no suspicious solid component or contrast enhancement. This is unchanged in comparison to prior MR examinations dating back to 06/15/2020, and in retrospective review, at most minimally enlarged in comparison to a remote prior CT examination dated 08/07/2007. This is consistent with a side branch IPMN, and given well-established long-term stability, no further follow-up or characterization is required. 2. Multiple contrast enhancing endoluminal masses and nodules of the gastric mucosa, largest of the posterior gastric body measuring 2.5 x 2.4 cm. Findings are highly concerning for malignancy. Recommend endoscopy for further evaluation. 3. Hepatic steatosis.

## 2023-01-28 NOTE — Progress Notes (Signed)
I connected with Jesus Bell on 05/10/2024at  3:30 PM EDT by video enabled telemedicine visit and verified that I am speaking with the correct person using two identifiers.  I discussed the limitations, risks, security and privacy concerns of performing an evaluation and management service by telemedicine and the availability of in-person appointments. I also discussed with the patient that there may be a patient responsible charge related to this service. The patient expressed understanding and agreed to proceed.    Other persons participating in the visit and their role in the encounter: RN/medical reconciliation Patient's location: home Provider's location: office  Oncology History   No history exists.    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 MARCH- 2023- Stable; no further follow up was recommended.   # Peri-Pancreatic mass 2.9 x 1.7 [SUV 6.3]/duodenum-? 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   History of present illness:Jesus Bell 67 y.o.  male with history of NO history of smoking; solitary lung nodule; and also thyroid nodule and pancreatic cyst is here for follow-up/and review imaging-MRI/thyroid.   Patient noted to have some blood in stool- 3-4 months ago x1 episode. Not on Oral iron.   Denies any worsening shortness of breath or cough denies abdominal pain pain or nausea vomiting.  Observation/objective: Alert & oriented x 3. In No acute distress.   Assessment and plan: Pancreas cyst # Incidental PET [sep 2021] scan findings of -peripancreatic/close to duodenum 2.9 cm lymph node [SUV 6.3] mild uptake  ~1.2 cm mass[ SUV 5.9 on pancreatic head. MARCH 2023-Stable size and appearance of the cystic lesion in the head of the pancreas which is most consistent with side branch IPMN. No new suspicious features. Stable enlarged lymph node just to the right of the duodenum. MAY 8th, 2024- MRI- Unchanged lobulated fluid signal cystic lesion of the central pancreatic head measuring 1.4 x 1.2 cm. This is consistent with a side branch IPMN, and given well-established long-term stability, no further follow-up or characterization is required.  # Incidental May 2024 MRI abdomen-showed multiple contrast enhancing endoluminal masses and nodules of the gastric mucosa, largest of the posterior gastric body measuring 2.5 x 2.4 cm. Findings are highly concerning for malignancy.  I would recommend endoscopic evaluation; will make referral for EUS. June 21st for colonoscopy - [facility] with Dr.Locklear/ Mr.Croley.   # Microcytic anemia-highly suspicious of iron deficiency; check iron studies ferritin.  Hold off oral iron-pending EGD/EUS/colonoscopy.  Today hemoglobin is 12 overall improved since a year ago.  # Incidental thyroid nodule- 3.2 cm right thyroid nodule, stable since 05/23/2020; ultrasound March 2023-no further follow-up recommended.  Left thyroid nodule-1 cm in size recommend annual ultrasound for total of 5 years. [2028]-  MAY 2024- Thyroid US- Stable size of 1.1 cm left inferior thyroid nodule (nodule 2) which shows slight change in some characteristics but still falls under same categorization for 1 year follow-up ultrasound.  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  Repeat imaging-March 2022-stable unchanged.  Radiologically considered benign.  No further follow up is recommended- stable.   #Fatty liver: Incidental MRI finding of hepatic steatosis.discussed risk of cirrhosis if uncontrolled.  I discussed the  importance of weight loss/cutting down processed foods including sodas; better control of his blood pressure and blood sugars.   #  DISPOSITION: # Follow up TBD- D.B   # I reviewed the blood work- with the patient in detail; also reviewed the imaging independently [as summarized above]; and with the patient in detail.   Dedicated senior care: [phone]  Follow-up instructions:  I discussed the assessment and treatment plan with the patient.  The patient was provided an opportunity to ask questions and all were answered.  The patient agreed with the plan and demonstrated understanding of instructions.  The patient was advised to call back or seek an in person evaluation if the symptoms worsen or if the condition fails to improve as anticipated   Dr. Louretta Shorten [facility] 02/20/2023 2:22 PM

## 2023-01-30 ENCOUNTER — Inpatient Hospital Stay: Payer: Medicare HMO | Attending: Internal Medicine

## 2023-01-30 ENCOUNTER — Inpatient Hospital Stay (HOSPITAL_BASED_OUTPATIENT_CLINIC_OR_DEPARTMENT_OTHER): Payer: Medicare HMO | Admitting: Internal Medicine

## 2023-01-30 ENCOUNTER — Encounter: Payer: Self-pay | Admitting: Internal Medicine

## 2023-01-30 ENCOUNTER — Other Ambulatory Visit: Payer: Self-pay

## 2023-01-30 VITALS — BP 150/78 | HR 100 | Temp 98.2°F | Ht 68.0 in | Wt 249.2 lb

## 2023-01-30 DIAGNOSIS — K862 Cyst of pancreas: Secondary | ICD-10-CM | POA: Insufficient documentation

## 2023-01-30 DIAGNOSIS — E041 Nontoxic single thyroid nodule: Secondary | ICD-10-CM

## 2023-01-30 DIAGNOSIS — E042 Nontoxic multinodular goiter: Secondary | ICD-10-CM | POA: Insufficient documentation

## 2023-01-30 DIAGNOSIS — C7A8 Other malignant neuroendocrine tumors: Secondary | ICD-10-CM | POA: Insufficient documentation

## 2023-01-30 DIAGNOSIS — K760 Fatty (change of) liver, not elsewhere classified: Secondary | ICD-10-CM | POA: Diagnosis not present

## 2023-01-30 DIAGNOSIS — N132 Hydronephrosis with renal and ureteral calculous obstruction: Secondary | ICD-10-CM | POA: Diagnosis not present

## 2023-01-30 DIAGNOSIS — R918 Other nonspecific abnormal finding of lung field: Secondary | ICD-10-CM | POA: Insufficient documentation

## 2023-01-30 DIAGNOSIS — R599 Enlarged lymph nodes, unspecified: Secondary | ICD-10-CM | POA: Diagnosis not present

## 2023-01-30 DIAGNOSIS — D508 Other iron deficiency anemias: Secondary | ICD-10-CM | POA: Diagnosis not present

## 2023-01-30 LAB — COMPREHENSIVE METABOLIC PANEL
ALT: 16 U/L (ref 0–44)
AST: 25 U/L (ref 15–41)
Albumin: 4.2 g/dL (ref 3.5–5.0)
Alkaline Phosphatase: 107 U/L (ref 38–126)
Anion gap: 12 (ref 5–15)
BUN: 10 mg/dL (ref 8–23)
CO2: 24 mmol/L (ref 22–32)
Calcium: 9.3 mg/dL (ref 8.9–10.3)
Chloride: 101 mmol/L (ref 98–111)
Creatinine, Ser: 1.05 mg/dL (ref 0.61–1.24)
GFR, Estimated: >60 mL/min (ref >60)
Glucose, Bld: 189 mg/dL — ABNORMAL HIGH (ref 70–99)
Potassium: 3.3 mmol/L — ABNORMAL LOW (ref 3.5–5.1)
Sodium: 137 mmol/L (ref 135–145)
Total Bilirubin: 0.8 mg/dL (ref 0.3–1.2)
Total Protein: 7.7 g/dL (ref 6.5–8.1)

## 2023-01-30 LAB — IRON AND TIBC
Iron: 29 ug/dL — ABNORMAL LOW (ref 45–182)
Saturation Ratios: 8 % — ABNORMAL LOW (ref 17.9–39.5)
TIBC: 370 ug/dL (ref 250–450)
UIBC: 341 ug/dL

## 2023-01-30 LAB — CBC WITH DIFFERENTIAL/PLATELET
Abs Immature Granulocytes: 0.02 10*3/uL (ref 0.00–0.07)
Basophils Absolute: 0 10*3/uL (ref 0.0–0.1)
Basophils Relative: 1 %
Eosinophils Absolute: 0.3 10*3/uL (ref 0.0–0.5)
Eosinophils Relative: 4 %
HCT: 37.8 % — ABNORMAL LOW (ref 39.0–52.0)
Hemoglobin: 12.8 g/dL — ABNORMAL LOW (ref 13.0–17.0)
Immature Granulocytes: 0 %
Lymphocytes Relative: 26 %
Lymphs Abs: 1.6 10*3/uL (ref 0.7–4.0)
MCH: 23.6 pg — ABNORMAL LOW (ref 26.0–34.0)
MCHC: 33.9 g/dL (ref 30.0–36.0)
MCV: 69.6 fL — ABNORMAL LOW (ref 80.0–100.0)
Monocytes Absolute: 0.7 10*3/uL (ref 0.1–1.0)
Monocytes Relative: 11 %
Neutro Abs: 3.7 10*3/uL (ref 1.7–7.7)
Neutrophils Relative %: 58 %
Platelets: 401 10*3/uL — ABNORMAL HIGH (ref 150–400)
RBC: 5.43 MIL/uL (ref 4.22–5.81)
RDW: 18.1 % — ABNORMAL HIGH (ref 11.5–15.5)
WBC: 6.4 10*3/uL (ref 4.0–10.5)
nRBC: 0 % (ref 0.0–0.2)

## 2023-01-30 LAB — FERRITIN: Ferritin: 44 ng/mL (ref 24–336)

## 2023-01-30 NOTE — Progress Notes (Signed)
MRI, u/s results.

## 2023-01-31 LAB — CANCER ANTIGEN 19-9: CA 19-9: 13 U/mL (ref 0–35)

## 2023-02-12 ENCOUNTER — Ambulatory Visit: Payer: Medicare HMO

## 2023-02-12 DIAGNOSIS — C7A092 Malignant carcinoid tumor of the stomach: Secondary | ICD-10-CM

## 2023-02-12 DIAGNOSIS — D124 Benign neoplasm of descending colon: Secondary | ICD-10-CM

## 2023-02-12 DIAGNOSIS — D122 Benign neoplasm of ascending colon: Secondary | ICD-10-CM

## 2023-02-12 DIAGNOSIS — D123 Benign neoplasm of transverse colon: Secondary | ICD-10-CM

## 2023-02-12 DIAGNOSIS — K640 First degree hemorrhoids: Secondary | ICD-10-CM

## 2023-02-12 DIAGNOSIS — D175 Benign lipomatous neoplasm of intra-abdominal organs: Secondary | ICD-10-CM

## 2023-02-20 ENCOUNTER — Inpatient Hospital Stay: Payer: Medicare HMO

## 2023-02-20 ENCOUNTER — Inpatient Hospital Stay (HOSPITAL_BASED_OUTPATIENT_CLINIC_OR_DEPARTMENT_OTHER): Payer: Medicare HMO | Admitting: Internal Medicine

## 2023-02-20 ENCOUNTER — Encounter: Payer: Self-pay | Admitting: Internal Medicine

## 2023-02-20 VITALS — BP 154/98 | HR 73 | Temp 97.2°F | Ht 68.0 in | Wt 248.0 lb

## 2023-02-20 DIAGNOSIS — K862 Cyst of pancreas: Secondary | ICD-10-CM

## 2023-02-20 DIAGNOSIS — C7A8 Other malignant neuroendocrine tumors: Secondary | ICD-10-CM

## 2023-02-20 DIAGNOSIS — R918 Other nonspecific abnormal finding of lung field: Secondary | ICD-10-CM | POA: Diagnosis not present

## 2023-02-20 LAB — CBC WITH DIFFERENTIAL (CANCER CENTER ONLY)
Abs Immature Granulocytes: 0.01 10*3/uL (ref 0.00–0.07)
Basophils Absolute: 0 10*3/uL (ref 0.0–0.1)
Basophils Relative: 1 %
Eosinophils Absolute: 0.2 10*3/uL (ref 0.0–0.5)
Eosinophils Relative: 2 %
HCT: 38.5 % — ABNORMAL LOW (ref 39.0–52.0)
Hemoglobin: 13 g/dL (ref 13.0–17.0)
Immature Granulocytes: 0 %
Lymphocytes Relative: 31 %
Lymphs Abs: 1.9 10*3/uL (ref 0.7–4.0)
MCH: 23.4 pg — ABNORMAL LOW (ref 26.0–34.0)
MCHC: 33.8 g/dL (ref 30.0–36.0)
MCV: 69.4 fL — ABNORMAL LOW (ref 80.0–100.0)
Monocytes Absolute: 1 10*3/uL (ref 0.1–1.0)
Monocytes Relative: 16 %
Neutro Abs: 3.2 10*3/uL (ref 1.7–7.7)
Neutrophils Relative %: 50 %
Platelet Count: 407 10*3/uL — ABNORMAL HIGH (ref 150–400)
RBC: 5.55 MIL/uL (ref 4.22–5.81)
RDW: 17.2 % — ABNORMAL HIGH (ref 11.5–15.5)
WBC Count: 6.3 10*3/uL (ref 4.0–10.5)
nRBC: 0 % (ref 0.0–0.2)

## 2023-02-20 LAB — CMP (CANCER CENTER ONLY)
ALT: 18 U/L (ref 0–44)
AST: 19 U/L (ref 15–41)
Albumin: 4.1 g/dL (ref 3.5–5.0)
Alkaline Phosphatase: 119 U/L (ref 38–126)
Anion gap: 10 (ref 5–15)
BUN: 14 mg/dL (ref 8–23)
CO2: 26 mmol/L (ref 22–32)
Calcium: 9.4 mg/dL (ref 8.9–10.3)
Chloride: 101 mmol/L (ref 98–111)
Creatinine: 1.12 mg/dL (ref 0.61–1.24)
GFR, Estimated: >60 mL/min (ref >60)
Glucose, Bld: 116 mg/dL — ABNORMAL HIGH (ref 70–99)
Potassium: 3.9 mmol/L (ref 3.5–5.1)
Sodium: 137 mmol/L (ref 135–145)
Total Bilirubin: 0.6 mg/dL (ref 0.3–1.2)
Total Protein: 7.7 g/dL (ref 6.5–8.1)

## 2023-02-20 NOTE — Progress Notes (Signed)
Asking how his tx will be administered?

## 2023-02-20 NOTE — Progress Notes (Signed)
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  Small pulmonary nodules-  # Peri-Pancreatic mass 2.9 x 1.7 [SUV 6.3]/duodenum-? 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    HISTORY OF PRESENTING ILLNESS: Patient ambulating-independently.  Alone.   Jesus Bell 67 y.o.  male NO history of smoking; solitary lung nodule; and also pancreatic cyst ; incidentally noted multiple malignant lesions in the stomach is here today with results of the pathology.  The interim patient underwent EGD/colonoscopy.  Denies any blood in stools or black or stools.  Appetite is good.  No weight loss.  No fatigue.   Denies any worsening shortness of breath or cough denies abdominal pain pain or nausea vomiting.  Review of Systems  Constitutional:  Negative for chills, diaphoresis, fever, malaise/fatigue and weight loss.  HENT:  Negative for nosebleeds and sore throat.   Eyes:  Negative for double vision.  Respiratory:  Negative for cough, hemoptysis, sputum production, shortness of breath and wheezing.   Cardiovascular:  Negative for chest pain, palpitations, orthopnea and leg swelling.  Gastrointestinal:  Negative for abdominal pain, blood in stool, constipation, diarrhea, heartburn, melena, nausea and vomiting.  Genitourinary:  Negative for dysuria, frequency and urgency.  Musculoskeletal:  Positive for back pain and joint pain.  Skin:  Negative.  Negative for itching and rash.  Neurological:  Negative for dizziness, tingling, focal weakness, weakness and headaches.  Endo/Heme/Allergies:  Does not bruise/bleed easily.  Psychiatric/Behavioral:  Negative for depression. The patient is not nervous/anxious and does not have insomnia.      MEDICAL HISTORY:  Past Medical History:  Diagnosis Date   Chicken pox    Diabetes mellitus type 2, uncomplicated (HCC)    Diabetes mellitus without complication (HCC)    boarderline   Erectile dysfunction    GERD (gastroesophageal reflux disease)    History of kidney stones    Hypercholesterolemia    Hypertension    Stroke [facility] 2012   hemorrhagic vessel    SURGICAL HISTORY: Past Surgical History:  Procedure Laterality Date   CYSTOSCOPY W/ RETROGRADES  05/21/2020   Procedure: CYSTOSCOPY WITH RETROGRADE PYELOGRAM;  Surgeon: Vanna Scotland, MD;  Location: [facility] OR[facility];  Service: Urology;;   CYSTOSCOPY W/ URETERAL STENT PLACEMENT Left 07/09/2020   Procedure: CYSTOSCOPY WITH STENT REPLACEMENT;  Surgeon: Vanna Scotland, MD;  Location: [facility];  Service: Urology;  Laterality: Left;   CYSTOSCOPY WITH BIOPSY N/A 06/04/2020   Procedure: CYSTOSCOPY WITH bladder neck BIOPSY;  Surgeon: Vanna Scotland, MD;  Location: [facility];  Service: Urology;  Laterality: N/A;   CYSTOSCOPY/RETROGRADE/URETEROSCOPY Left 07/09/2020   Procedure: CYSTOSCOPY/RETROGRADE/URETEROSCOPY;  Surgeon: Vanna Scotland, MD;  Location: [facility];  Service: Urology;  Laterality: Left;   CYSTOSCOPY/URETEROSCOPY/HOLMIUM LASER/STENT PLACEMENT Left 05/21/2020   Procedure: CYSTOSCOPY/URETEROSCOPY/STENT PLACEMENT;  Surgeon: Vanna Scotland, MD;  Location: [facility];  Service: Urology;  Laterality: Left;   CYSTOSCOPY/URETEROSCOPY/HOLMIUM LASER/STENT PLACEMENT Left 06/04/2020   Procedure: CYSTOSCOPY/URETEROSCOPY/HOLMIUM LASER/STENT Exchange;  Surgeon: Vanna Scotland, MD;  Location: [facility];  Service: Urology;  Laterality: Left;  SHOULDER  SURGERY Bilateral    clavicle   TOTAL HIP ARTHROPLASTY Left 08/17/2017   Procedure: TOTAL HIP ARTHROPLASTY;  Surgeon: Donato Heinz, MD;  Location: [facility];  Service: Orthopedics;  Laterality: Left;   TOTAL HIP REVISION Left 11/09/2020   Procedure: TOTAL HIP REVISION;  Surgeon: Donato Heinz, MD;  Location: [facility];  Service: Orthopedics;  Laterality: Left;   WRIST FUSION Left     SOCIAL HISTORY: Social History   Socioeconomic History   Marital status: Married    Spouse name: Alona Bene   Number of children: 4   Years of education: 12   Highest education level: High school graduate  Occupational History   Not on file  Tobacco Use   Smoking status: Never   Smokeless tobacco: Never  Vaping Use   Vaping Use: Never used  Substance and Sexual Activity   Alcohol use: No   Drug use: No   Sexual activity: Not on file  Other Topics Concern   Not on file  Social History Narrative   No smoking no alcohol; used to work in Consulting civil engineer; 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 [place]. With home. 4 children.    Social Determinants of Health   Financial Resource Strain: Not on file  Food Insecurity: Not on file  Transportation Needs: Not on file  Physical Activity: Not on file  Stress: Not on file  Social Connections: Not on file  Intimate Partner Violence: Not on file    FAMILY HISTORY: Family History  Adopted: Yes  Family history unknown: Yes    ALLERGIES:  is allergic to nsaids and aspirin.  MEDICATIONS:  Current Outpatient Medications  Medication Sig Dispense Refill   acetaminophen (TYLENOL) 325 MG tablet Take 325-650 mg by mouth every 4 (four) hours as needed for mild pain or headache.     amLODipine (NORVASC) 10 MG tablet Take 10 mg by mouth daily with breakfast.      lovastatin (MEVACOR) 40 MG tablet Take 40 mg by mouth daily.      metFORMIN (GLUCOPHAGE) 1000 MG tablet Take 1,000 mg by mouth 2 (two) times daily after a meal.      omeprazole (PRILOSEC) 40 MG  capsule Take 40 mg by mouth daily.      sildenafil (VIAGRA) 100 MG tablet Take 100 mg by mouth as needed for erectile dysfunction.      No current facility-administered medications for this visit.      Marland Kitchen  PHYSICAL EXAMINATION: ECOG PERFORMANCE STATUS: 0 - Asymptomatic  Vitals:   02/20/23 1339  BP: (!) 154/98  Pulse: 73  Temp: (!) 97.2 F (36.2 C)  SpO2: 100%   Filed Weights   02/20/23 1339  Weight: 248 lb (112.5 kg)    Physical Exam Constitutional:      Comments: Walking with a cane [recent hip surgery]; alone.  HENT:     Head: Normocephalic and atraumatic.     Mouth/Throat:     Pharynx: No oropharyngeal exudate.  Eyes:     Pupils: Pupils are equal, round, and reactive to light.  Cardiovascular:     Rate and Rhythm: Normal rate and regular rhythm.  Pulmonary:     Effort: Pulmonary effort is normal. No respiratory distress.     Breath sounds: Normal breath sounds. No wheezing.  Abdominal:     General: Bowel sounds are normal. There is no distension.     Palpations: Abdomen is soft. There is no mass.     Tenderness: There is  no abdominal tenderness. There is no guarding or rebound.  Musculoskeletal:        General: No tenderness. Normal range of motion.     Cervical back: Normal range of motion and neck supple.  Skin:    General: Skin is warm.  Neurological:     Mental Status: He is alert and oriented to person, place, and time.  Psychiatric:        Mood and Affect: Affect normal.      LABORATORY DATA:  I have reviewed the data as listed Lab Results  Component Value Date   WBC 6.3 02/20/2023   HGB 13.0 02/20/2023   HCT 38.5 (L) 02/20/2023   MCV 69.4 (L) 02/20/2023   PLT 407 (H) 02/20/2023   Recent Labs    01/30/23 1508 02/20/23 1340  NA 137 137  K 3.3* 3.9  CL 101 101  CO2 24 26  GLUCOSE 189* 116*  BUN 10 14  CREATININE 1.05 1.12  CALCIUM 9.3 9.4  GFRNONAA >60 >60  PROT 7.7 7.7  ALBUMIN 4.2 4.1  AST 25 19  ALT 16 18  ALKPHOS 107 119   BILITOT 0.8 0.6    RADIOGRAPHIC STUDIES: I have personally reviewed the radiological images as listed and agreed with the findings in the report. MR ABDOMEN W WO CONTRAST  Result Date: 01/27/2023 CLINICAL DATA:  Follow-up pancreatic cyst EXAM: MRI ABDOMEN WITHOUT AND WITH CONTRAST TECHNIQUE: Multiplanar multisequence MR imaging of the abdomen was performed both before and after the administration of intravenous contrast. CONTRAST:  10mL GADAVIST GADOBUTROL 1 MMOL/ML IV SOLN COMPARISON:  MR abdomen, 12/04/2021, 06/15/2020, CT abdomen pelvis, 08/07/2007 FINDINGS: Lower chest: No acute abnormality.  Cardiomegaly. Hepatobiliary: No solid liver abnormality is seen. Hepatic steatosis. No gallstones, gallbladder wall thickening, or biliary dilatation. Pancreas: Unchanged lobulated fluid signal cystic lesion of the central pancreatic head measuring 1.4 x 1.2 cm (series 4, image 26). This as previously reported appears to communicate directly with the main pancreatic duct and there is no suspicious solid component or contrast enhancement. This is unchanged in comparison to prior MR examinations dating back to 06/15/2020, and this is at most minimally enlarged in comparison to a remote prior CT examination dated 08/07/2007. No pancreatic ductal dilatation or surrounding inflammatory changes. Spleen: Normal in size without significant abnormality. Adrenals/Urinary Tract: Adrenal glands are unremarkable. Simple, benign right renal cortical cysts, for which no further follow-up or characterization is required. Kidneys are otherwise normal, without obvious renal calculi, solid lesion, or hydronephrosis. Stomach/Bowel: Multiple contrast enhancing endoluminal masses and nodules of the gastric mucosa, largest mass of the posterior gastric gastric body measuring 2.5 x 2.4 cm (series 22, image 45), additional nodule of the anterior gastric fundus measuring 1.3 x 1.3 cm (series 22, image 27). No evidence of bowel wall  thickening, distention, or inflammatory changes. Vascular/Lymphatic: No significant vascular findings are present. No enlarged abdominal lymph nodes. Other: No abdominal wall hernia or abnormality. No ascites. Musculoskeletal: No acute or significant osseous findings. IMPRESSION: 1. Unchanged lobulated fluid signal cystic lesion of the central pancreatic head measuring 1.4 x 1.2 cm. This as previously reported appears to communicate directly with the main pancreatic duct and there is no suspicious solid component or contrast enhancement. This is unchanged in comparison to prior MR examinations dating back to 06/15/2020, and in retrospective review, at most minimally enlarged in comparison to a remote prior CT examination dated 08/07/2007. This is consistent with a side branch IPMN, and given well-established long-term stability, no further  follow-up or characterization is required. 2. Multiple contrast enhancing endoluminal masses and nodules of the gastric mucosa, largest of the posterior gastric body measuring 2.5 x 2.4 cm. Findings are highly concerning for malignancy. Recommend endoscopy for further evaluation. 3. Hepatic steatosis. These results will be called to the ordering clinician or representative by the Radiologist Assistant, and communication documented in the PACS or [employer]. Electronically Signed   By: Jearld Lesch M.D.   On: 01/27/2023 21:25   US THYROID  Result Date: 01/24/2023 CLINICAL DATA:  Prior ultrasound follow-up. Left inferior thyroid nodule meeting criteria for 1 year follow-up ultrasound. EXAM: THYROID ULTRASOUND TECHNIQUE: Ultrasound examination of the thyroid gland and adjacent soft tissues was performed. COMPARISON:  01/22/2022 FINDINGS: Parenchymal Echotexture: Mildly heterogenous Isthmus: 0.4 cm Right lobe: 4.5 x 2.8 x 3.3 cm Left lobe: 4.3 x 1.9 x 1.8 cm _________________________________________________________ Estimated total number of nodules >/= 1 cm: 4 Number of  spongiform nodules >/=  2 cm not described below (TR1): 0 Number of mixed cystic and solid nodules >/= 1.5 cm not described below (TR2): 0 _________________________________________________________ Nodule # 2: Prior biopsy: No Location: Left; Inferior Maximum size: 1.1 cm; Other 2 dimensions: 1.1 x 0.9 cm, previously, 1.0 cm Composition: solid/almost completely solid (2) Echogenicity: hyperechoic (1) Shape: taller-than-wide (3) Margins: smooth (0) Echogenic foci: none (0) ACR TI-RADS total points: 6. ACR TI-RADS risk category:  TR4 (4-6 points). Significant change in size (>/= 20% in two dimensions and minimal increase of 2 mm): No Change in features: The nodule now appears hyperechoic rather than hypoechoic but also taller than wide. Change in ACR TI-RADS risk category: No ACR TI-RADS recommendations: *Given size (>/= 1 - 1.4 cm) and appearance, a follow-up ultrasound in 1 year should be considered based on TI-RADS criteria. _________________________________________________________ Other left lobe cysts and the 3.4 cm solid/cystic nodule in the right lobe are stable and do not meet criteria for follow-up. No enlarged or abnormal appearing lymph nodes are identified. IMPRESSION: Stable size of 1.1 cm left inferior thyroid nodule (nodule 2) which shows slight change in some characteristics but still falls under same categorization for 1 year follow-up ultrasound. The above is in keeping with the ACR TI-RADS recommendations - J Am Coll Radiol 2017;14:587-595. Electronically Signed   By: Irish Lack M.D.   On: 01/24/2023 10:53     ASSESSMENT & PLAN:   Primary malignant neuroendocrine tumor of stomach [facility]) #  Incidental May 2024 MRI abdomen-showed multiple contrast enhancing endoluminal masses and nodules of the gastric mucosa, largest of the posterior gastric body measuring 2.5 x 2.4 cm.s/p EGD- Bx [Dr.Locklear] well differentiated NET (G1), KI-67 <3%, mitotic rate was < 2. Pt had several of the lesions with  the biggest being 3-4 cm. Ordered Ga-Dotatate scan.   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  Hemoglobin improved current 13. Stable.    #  MAY 8th, 2024- MRI- Unchanged lobulated fluid signal cystic lesion of the central pancreatic head measuring 1.4 x 1.2 cm. This is consistent with a side branch IPMN, and given well-established long-term stability, no further follow-up or characterization is required.    # Incidental thyroid nodule- 3.2 cm right thyroid nodule, stable since 05/23/2020; ultrasound March 2023-no further follow-up recommended.  Left thyroid nodule-1 cm in size recommend annual ultrasound for total of 5 years. [2028]-  MAY 2024- Thyroid US- Stable size of 1.1 cm left inferior thyroid nodule (nodule 2) which shows slight change in some characteristics but still falls under same  categorization for 1 year follow-up ultrasound.   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  Repeat imaging-March 2022-stable unchanged.  Radiologically considered benign.  No further follow up is recommended- stable.    Will call with results of PET scan- # DISPOSITION: # PET scan in  2 weeks- # follow up TBD- Dr.B  All questions were answered. The patient knows to call the clinic with any problems, questions or concerns.    Earna Coder, MD 02/20/2023 4:56 PM

## 2023-02-20 NOTE — Assessment & Plan Note (Addendum)
#    Incidental May 2024 MRI abdomen-showed multiple contrast enhancing endoluminal masses and nodules of the gastric mucosa, largest of the posterior gastric body measuring 2.5 x 2.4 cm.s/p EGD- Bx [Dr.Locklear] well differentiated NET (G1), KI-67 <3%, mitotic rate was < 2. Pt had several of the lesions with the biggest being 3-4 cm. Ordered Ga-Dotatate scan.   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  Hemoglobin improved current 13. Stable.    #  MAY 8th, 2024- MRI- Unchanged lobulated fluid signal cystic lesion of the central pancreatic head measuring 1.4 x 1.2 cm. This is consistent with a side branch IPMN, and given well-established long-term stability, no further follow-up or characterization is required.    # Incidental thyroid nodule- 3.2 cm right thyroid nodule, stable since 05/23/2020; ultrasound March 2023-no further follow-up recommended.  Left thyroid nodule-1 cm in size recommend annual ultrasound for total of 5 years. [2028]-  MAY 2024- Thyroid US- Stable size of 1.1 cm left inferior thyroid nodule (nodule 2) which shows slight change in some characteristics but still falls under same categorization for 1 year follow-up ultrasound.   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  Repeat imaging-March 2022-stable unchanged.  Radiologically considered benign.  No further follow up is recommended- stable.    Will call with results of PET scan- # DISPOSITION: # PET scan in  2 weeks- # follow up TBD- Dr.B

## 2023-02-23 ENCOUNTER — Inpatient Hospital Stay: Payer: Medicare HMO

## 2023-02-23 ENCOUNTER — Inpatient Hospital Stay: Payer: Medicare HMO | Admitting: Internal Medicine

## 2023-02-23 ENCOUNTER — Encounter: Payer: Self-pay | Admitting: Gastroenterology

## 2023-02-24 ENCOUNTER — Telehealth: Payer: Self-pay | Admitting: Internal Medicine

## 2023-02-24 ENCOUNTER — Telehealth: Payer: Self-pay

## 2023-02-24 NOTE — Telephone Encounter (Signed)
Referral sent to [facility] for NET of stomach

## 2023-02-24 NOTE — Telephone Encounter (Signed)
I spoke to patient regarding my discussion with Dr. Charlsie Quest.  Plan is to have the patient evaluated by [facility].  Discussed with Danford Bad.   PET scan on June 12th.   Please have the patient follow-up with me 2 to 3 days after the PET scan is done-MD no labs- Dr.B

## 2023-03-04 ENCOUNTER — Ambulatory Visit
Admission: RE | Admit: 2023-03-04 | Discharge: 2023-03-04 | Disposition: A | Discharge status: Home / Self Care | Payer: Medicare HMO | Source: Ambulatory Visit | Attending: Internal Medicine | Admitting: Internal Medicine

## 2023-03-04 DIAGNOSIS — C772 Secondary and unspecified malignant neoplasm of intra-abdominal lymph nodes: Secondary | ICD-10-CM | POA: Diagnosis not present

## 2023-03-04 DIAGNOSIS — C7A8 Other malignant neuroendocrine tumors: Secondary | ICD-10-CM | POA: Insufficient documentation

## 2023-03-04 MED ORDER — COPPER CU 64 DOTATATE 1 MCI/ML IV SOLN
4.0000 | Freq: Once | INTRAVENOUS | Status: AC
  Administered 2023-03-04: 4.39 via INTRAVENOUS

## 2023-03-10 ENCOUNTER — Inpatient Hospital Stay: Payer: Medicare HMO | Attending: Internal Medicine | Admitting: Internal Medicine

## 2023-03-10 ENCOUNTER — Encounter: Payer: Self-pay | Admitting: Internal Medicine

## 2023-03-10 ENCOUNTER — Telehealth: Payer: Self-pay | Admitting: Internal Medicine

## 2023-03-10 DIAGNOSIS — C7A8 Other malignant neuroendocrine tumors: Secondary | ICD-10-CM | POA: Diagnosis present

## 2023-03-10 DIAGNOSIS — E042 Nontoxic multinodular goiter: Secondary | ICD-10-CM | POA: Insufficient documentation

## 2023-03-10 DIAGNOSIS — D508 Other iron deficiency anemias: Secondary | ICD-10-CM | POA: Insufficient documentation

## 2023-03-10 DIAGNOSIS — R918 Other nonspecific abnormal finding of lung field: Secondary | ICD-10-CM | POA: Diagnosis not present

## 2023-03-10 NOTE — Telephone Encounter (Signed)
I tried to reach the patient to discuss the need for 24-hour urine collection for 5-HIAA.  Left a voicemail with the patient-that recommended test to look for any hormones that could be secreted in the urine by stomach tumor.  This information would be helpful for the doctors at [facility].  A/M- Please inform the patient of member recommendations.  The test is ordered.

## 2023-03-10 NOTE — Addendum Note (Signed)
Addended by: Darrold Span A on: 03/10/2023 01:03 PM   Modules accepted: Orders

## 2023-03-10 NOTE — Assessment & Plan Note (Addendum)
#    Incidental May 2024 MRI abdomen-showed multiple contrast enhancing endoluminal masses and nodules of the gastric mucosa, largest of the posterior gastric body measuring 2.5 x 2.4 cm.s/p EGD- Bx [Dr.Locklear] well differentiated NET (G1), KI-67 <3%, mitotic rate was < 2. Pt had several of the lesions with the biggest being 3-4 cm. 03/04/2023- PET scan Ga Dototate: Multifocal radiotracer avid lesions within the gastric body and antrum consistent with well differentiated neuroendocrine tumor; Radiotracer avid metastatic lymph node adjacent the head of the pancreas; Three small radiotracer avid peritoneal implants in the ventral LEFT mid abdomen;  No metastatic disease outside abdomen.  No skeletal metastasis.     # I reviewed with the patient the imaging-and accompanying findings of advanced disease given the fact that patient has peritoneal metastases although small.  However given his good functional status/limited abdominal disease-I think it is reasonable to consider evaluation surgery for consideration resection.  Will make a referral to [facility] surgery.   # Discussed regarding use of Sandostatin control the disease.  Ordered for next week.  Await surgery evaluation. Check 24 hours urine 5 HIAA.   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  Hemoglobin improved current 13. Stable; will repeat levels again at next visit.  # Incidental thyroid nodule- 3.2 cm right thyroid nodule, stable since 05/23/2020; ultrasound March 2023-no further follow-up recommended.  Left thyroid nodule-1 cm in size recommend annual ultrasound for total of 5 years. [2028]-  MAY 2024- Thyroid US- Stable size of 1.1 cm left inferior thyroid nodule (nodule 2) which shows slight change in some characteristics but still falls under same categorization for 1 year follow-up ultrasound.   # DISPOSITION: Order 5HIAA testing.  # follow up in 6 weeks- MD; labs- cbc/cmp; iron studies; ferritin; chromogranin A levels;possible  sandostatin- - Dr.B  # I reviewed the blood work- with the patient in detail; also reviewed the imaging independently [as summarized above]; and with the patient in detail.

## 2023-03-10 NOTE — Telephone Encounter (Signed)
PER Chat:   6 weeks- MD; labs- cbc/cmp; iron studies; ferritin; chromogranin A levels;possible sandostatin   Appointments scheduled and notified via mychart

## 2023-03-10 NOTE — Progress Notes (Signed)
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  Small pulmonary nodules-  # Peri-Pancreatic mass 2.9 x 1.7 [SUV 6.3]/duodenum-? 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    HISTORY OF PRESENTING ILLNESS: Patient ambulating-independently.  Alone.   Jesus Bell 67 y.o.  male NO history of smoking; solitary lung nodule; and also pancreatic cyst ; incidentally noted multiple malignant lesions in the stomach is here today today to review the results of the PET scan.   Denies any blood in stools or black or stools.  Appetite is good.  No weight loss.  No fatigue. Denies any diarrhea or skin rash or flushing.   Denies any worsening shortness of breath or cough denies abdominal pain pain or nausea vomiting.  Review of Systems  Constitutional:  Negative for chills, diaphoresis, fever, malaise/fatigue and weight loss.  HENT:  Negative for nosebleeds and sore throat.   Eyes:  Negative for double vision.  Respiratory:  Negative for cough, hemoptysis, sputum production, shortness of breath and wheezing.   Cardiovascular:  Negative for chest pain, palpitations, orthopnea and leg swelling.  Gastrointestinal:  Negative for abdominal pain, blood in stool, constipation, diarrhea, heartburn, melena, nausea and vomiting.  Genitourinary:  Negative for dysuria, frequency and urgency.  Musculoskeletal:  Positive for back pain and joint pain.   Skin: Negative.  Negative for itching and rash.  Neurological:  Negative for dizziness, tingling, focal weakness, weakness and headaches.  Endo/Heme/Allergies:  Does not bruise/bleed easily.  Psychiatric/Behavioral:  Negative for depression. The patient is not nervous/anxious and does not have insomnia.      MEDICAL HISTORY:  Past Medical History:  Diagnosis Date   Chicken pox    Diabetes mellitus type 2, uncomplicated (HCC)    Diabetes mellitus without complication (HCC)    boarderline   Erectile dysfunction    GERD (gastroesophageal reflux disease)    History of kidney stones    Hypercholesterolemia    Hypertension    Stroke [facility]) 2012   hemorrhagic vessel    SURGICAL HISTORY: Past Surgical History:  Procedure Laterality Date   CYSTOSCOPY W/ RETROGRADES  05/21/2020   Procedure: CYSTOSCOPY WITH RETROGRADE PYELOGRAM;  Surgeon: Vanna Scotland, MD;  Location: [facility] ORS;  Service: Urology;;   CYSTOSCOPY W/ URETERAL STENT PLACEMENT Left 07/09/2020   Procedure: CYSTOSCOPY WITH STENT REPLACEMENT;  Surgeon: Vanna Scotland, MD;  Location: [facility] OR[facility];  Service: Urology;  Laterality: Left;   CYSTOSCOPY WITH BIOPSY N/A 06/04/2020   Procedure: CYSTOSCOPY WITH bladder neck BIOPSY;  Surgeon: Vanna Scotland, MD;  Location: [facility] OR[facility];  Service: Urology;  Laterality: N/A;   CYSTOSCOPY/RETROGRADE/URETEROSCOPY Left 07/09/2020   Procedure: CYSTOSCOPY/RETROGRADE/URETEROSCOPY;  Surgeon: Vanna Scotland, MD;  Location: [facility] OR[facility];  Service: Urology;  Laterality: Left;   CYSTOSCOPY/URETEROSCOPY/HOLMIUM LASER/STENT PLACEMENT Left 05/21/2020   Procedure: CYSTOSCOPY/URETEROSCOPY/STENT PLACEMENT;  Surgeon: Vanna Scotland, MD;  Location: [facility] OR[facility];  Service: Urology;  Laterality: Left;   CYSTOSCOPY/URETEROSCOPY/HOLMIUM LASER/STENT PLACEMENT Left 06/04/2020   Procedure: CYSTOSCOPY/URETEROSCOPY/HOLMIUM LASER/STENT Exchange;  Surgeon: Vanna Scotland, MD;  Location: [facility];  Service: Urology;  Laterality: Left;    SHOULDER SURGERY Bilateral    clavicle   TOTAL HIP ARTHROPLASTY Left 08/17/2017   Procedure: TOTAL HIP ARTHROPLASTY;  Surgeon: Donato Heinz, MD;  Location: [facility] OR[facility];  Service: Orthopedics;  Laterality: Left;   TOTAL HIP REVISION Left 11/09/2020   Procedure: TOTAL HIP REVISION;  Surgeon: Donato Heinz, MD;  Location: [facility];  Service: Orthopedics;  Laterality: Left;   WRIST FUSION Left     SOCIAL HISTORY: Social History   Socioeconomic History   Marital status: Married    Spouse name: Alona Bene   Number of children: 4   Years of education: 12   Highest education level: High school graduate  Occupational History   Not on file  Tobacco Use   Smoking status: Never   Smokeless tobacco: Never  Vaping Use   Vaping Use: Never used  Substance and Sexual Activity   Alcohol use: No   Drug use: No   Sexual activity: Not on file  Other Topics Concern   Not on file  Social History Narrative   No smoking no alcohol; used to work in Consulting civil engineer; 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 [place]. With home. 4 children.    Social Determinants of Health   Financial Resource Strain: Not on file  Food Insecurity: Not on file  Transportation Needs: Not on file  Physical Activity: Not on file  Stress: Not on file  Social Connections: Not on file  Intimate Partner Violence: Not on file    FAMILY HISTORY: Family History  Adopted: Yes  Family history unknown: Yes    ALLERGIES:  is allergic to nsaids and aspirin.  MEDICATIONS:  Current Outpatient Medications  Medication Sig Dispense Refill   acetaminophen (TYLENOL) 325 MG tablet Take 325-650 mg by mouth every 4 (four) hours as needed for mild pain or headache.     amLODipine (NORVASC) 10 MG tablet Take 10 mg by mouth daily with breakfast.      lovastatin (MEVACOR) 40 MG tablet Take 40 mg by mouth daily.      metFORMIN (GLUCOPHAGE) 1000 MG tablet Take 1,000 mg by mouth 2 (two) times daily after a meal.      omeprazole  (PRILOSEC) 40 MG capsule Take 40 mg by mouth daily.      sildenafil (VIAGRA) 100 MG tablet Take 100 mg by mouth as needed for erectile dysfunction.      No current facility-administered medications for this visit.      Marland Kitchen  PHYSICAL EXAMINATION: ECOG PERFORMANCE STATUS: 0 - Asymptomatic  Vitals:   03/10/23 1043  BP: (!) 158/82  Pulse: 88  Temp: 98.7 F (37.1 C)  SpO2: 99%    Filed Weights   03/10/23 1043  Weight: 246 lb (111.6 kg)     Physical Exam Constitutional:      Comments: Walking with a cane [recent hip surgery]; alone.  HENT:     Head: Normocephalic and atraumatic.     Mouth/Throat:     Pharynx: No oropharyngeal exudate.  Eyes:     Pupils: Pupils are equal, round, and reactive to light.  Cardiovascular:     Rate and Rhythm: Normal rate and regular rhythm.  Pulmonary:     Effort: Pulmonary effort is normal. No respiratory distress.     Breath sounds: Normal breath sounds. No wheezing.  Abdominal:     General: Bowel sounds are normal. There is no distension.     Palpations: Abdomen is soft. There is no  mass.     Tenderness: There is no abdominal tenderness. There is no guarding or rebound.  Musculoskeletal:        General: No tenderness. Normal range of motion.     Cervical back: Normal range of motion and neck supple.  Skin:    General: Skin is warm.  Neurological:     Mental Status: He is alert and oriented to person, place, and time.  Psychiatric:        Mood and Affect: Affect normal.      LABORATORY DATA:  I have reviewed the data as listed Lab Results  Component Value Date   WBC 6.3 02/20/2023   HGB 13.0 02/20/2023   HCT 38.5 (L) 02/20/2023   MCV 69.4 (L) 02/20/2023   PLT 407 (H) 02/20/2023   Recent Labs    01/30/23 1508 02/20/23 1340  NA 137 137  K 3.3* 3.9  CL 101 101  CO2 24 26  GLUCOSE 189* 116*  BUN 10 14  CREATININE 1.05 1.12  CALCIUM 9.3 9.4  GFRNONAA >60 >60  PROT 7.7 7.7  ALBUMIN 4.2 4.1  AST 25 19  ALT 16 18   ALKPHOS 107 119  BILITOT 0.8 0.6    RADIOGRAPHIC STUDIES: I have personally reviewed the radiological images as listed and agreed with the findings in the report. NM PET DOTATATE SKULL BASE TO MID THIGH  Result Date: 03/04/2023 CLINICAL DATA:  Biopsy of gastric mass. Endoscopic resection. Well differentiated neuroendocrine tumor. EXAM: NUCLEAR MEDICINE PET SKULL BASE TO THIGH TECHNIQUE: 4.4 mCi copper 64 DOTATATE was injected intravenously. Full-ring PET imaging was performed from the skull base to thigh after the radiotracer. CT data was obtained and used for attenuation correction and anatomic localization. COMPARISON:  MRI abdomen 01/23/2023 FINDINGS: NECK No radiotracer activity in neck lymph nodes. Incidental CT findings: None CHEST No radiotracer accumulation within mediastinal or hilar lymph nodes. No suspicious pulmonary nodules on the CT scan. Incidental CT finding:None ABDOMEN/PELVIS Multiple foci of intense radiotracer activity within the gastric body. Lesion not well-defined on the noncontrast CT. Visually lesions are intense with SUV max equal 65 in the proximal body (image 65. There approximately 6 lesions in the gastric body and proximal antrum. Density radiotracer avid lymph node adjacent the head of the pancreas measuring 15 mm short axis image 100/4 close with SUV max equal 108. No radiotracer avid lesions in liver. Three very small radiotracer avid peritoneal implants in the ventral LEFT mid abdomen. The largest measuring 6 mm image 92/4 with SUV max equal 13. Physiologic activity noted in the liver, spleen, adrenal glands and kidneys. Incidental CT findings:None SKELETON No focal activity to suggest skeletal metastasis. Incidental CT findings:None IMPRESSION: 1. Multifocal radiotracer avid lesions within the gastric body and antrum consistent with well differentiated neuroendocrine tumor. 2. Radiotracer avid metastatic lymph node adjacent the head of the pancreas. 3. Three small  radiotracer avid peritoneal implants in the ventral LEFT mid abdomen. 4. No metastatic disease outside abdomen.  No skeletal metastasis Electronically Signed   By: Genevive Bi M.D.   On: 03/04/2023 15:34     ASSESSMENT & PLAN:   Primary malignant neuroendocrine tumor of stomach [facility]) #  Incidental May 2024 MRI abdomen-showed multiple contrast enhancing endoluminal masses and nodules of the gastric mucosa, largest of the posterior gastric body measuring 2.5 x 2.4 cm.s/p EGD- Bx [Dr.Locklear] well differentiated NET (G1), KI-67 <3%, mitotic rate was < 2. Pt had several of the lesions with the biggest being 3-4 cm.  03/04/2023- PET scan Ga Dototate: Multifocal radiotracer avid lesions within the gastric body and antrum consistent with well differentiated neuroendocrine tumor; Radiotracer avid metastatic lymph node adjacent the head of the pancreas; Three small radiotracer avid peritoneal implants in the ventral LEFT mid abdomen;  No metastatic disease outside abdomen.  No skeletal metastasis.     # I reviewed with the patient the imaging-and accompanying findings of advanced disease given the fact that patient has peritoneal metastases although small.  However given his good functional status/limited abdominal disease-I think it is reasonable to consider evaluation surgery for consideration resection.  Will make a referral to [facility] surgery.   # Discussed regarding use of Sandostatin control the disease.  Ordered for next week.  Await surgery evaluation. Check 24 hours urine 5 HIAA.   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  Hemoglobin improved current 13. Stable; will repeat levels again at next visit.  # Incidental thyroid nodule- 3.2 cm right thyroid nodule, stable since 05/23/2020; ultrasound March 2023-no further follow-up recommended.  Left thyroid nodule-1 cm in size recommend annual ultrasound for total of 5 years. [2028]-  MAY 2024- Thyroid US- Stable size of 1.1 cm left  inferior thyroid nodule (nodule 2) which shows slight change in some characteristics but still falls under same categorization for 1 year follow-up ultrasound.   # DISPOSITION: Order 5HIAA testing.  # follow up in 6 weeks- MD; labs- cbc/cmp; iron studies; ferritin; chromogranin A levels;possible sandostatin- - Dr.B  All questions were answered. The patient knows to call the clinic with any problems, questions or concerns.    Earna Coder, MD 03/10/2023 12:51 PM

## 2023-03-10 NOTE — Progress Notes (Signed)
PET results.  C/o getting calls from [facility]. How they are getting his info about needing the PET, but had done here at [facility].

## 2023-03-10 NOTE — Progress Notes (Signed)
Referral sent to [facility] oncology GI surgery, Dr. Sherryll Burger.

## 2023-03-16 ENCOUNTER — Other Ambulatory Visit: Payer: Self-pay

## 2023-03-16 DIAGNOSIS — C7A8 Other malignant neuroendocrine tumors: Secondary | ICD-10-CM

## 2023-03-17 ENCOUNTER — Ambulatory Visit: Payer: Medicare HMO

## 2023-03-18 ENCOUNTER — Other Ambulatory Visit: Payer: Self-pay | Admitting: *Deleted

## 2023-03-18 DIAGNOSIS — C7A8 Other malignant neuroendocrine tumors: Secondary | ICD-10-CM

## 2023-03-19 ENCOUNTER — Inpatient Hospital Stay: Payer: Medicare HMO

## 2023-03-19 DIAGNOSIS — C7A8 Other malignant neuroendocrine tumors: Secondary | ICD-10-CM

## 2023-03-20 LAB — 5 HIAA, QUANTITATIVE, URINE, 24 HOUR
5-HIAA, Ur: 2.5 mg/L
5-HIAA,Quant.,24 Hr Urine: 5.6 mg/24 hr (ref 0.0–14.9)
Total Volume: 2250

## 2023-03-23 ENCOUNTER — Ambulatory Visit: Payer: Medicare HMO | Admitting: Dietician

## 2023-03-23 LAB — GASTRIN: Gastrin: 3344 pg/mL — ABNORMAL HIGH (ref 0–115)

## 2023-04-16 NOTE — Addendum Note (Signed)
Encounter addended by: Bonney Aid on: 04/16/2023 2:44 PM  Actions taken: Imaging Exam ended

## 2023-04-21 ENCOUNTER — Telehealth: Payer: Self-pay | Admitting: Internal Medicine

## 2023-04-21 ENCOUNTER — Inpatient Hospital Stay: Payer: Medicare HMO

## 2023-04-21 ENCOUNTER — Inpatient Hospital Stay: Payer: Medicare HMO | Admitting: Internal Medicine

## 2023-04-21 NOTE — Telephone Encounter (Signed)
Pt called to cancel appts for today, pt just got out of the hospital, does NOT want to r/s at this time because he has to follow up with the surgeon next week.

## 2023-07-08 ENCOUNTER — Ambulatory Visit: Payer: Medicare HMO | Admitting: Dietician

## 2023-12-04 ENCOUNTER — Other Ambulatory Visit: Payer: Self-pay | Admitting: *Deleted

## 2023-12-04 ENCOUNTER — Telehealth: Payer: Self-pay | Admitting: *Deleted

## 2023-12-04 DIAGNOSIS — C7A8 Other malignant neuroendocrine tumors: Secondary | ICD-10-CM

## 2023-12-04 NOTE — Telephone Encounter (Signed)
 I told her that the pt does not have a pet scan scheduled and his last time seeing the MD he did not say to ddo a scan. She will tell Dr. Sherryll Burger.As I looked at his appts. He had one 7/30 to come back and see MD and get inj and labs. The pt. Cancelled the appt and would call back. He does not have any appts for the future. I asked michele to get the pt. An appt for him

## 2023-12-08 ENCOUNTER — Inpatient Hospital Stay: Attending: Internal Medicine

## 2023-12-08 ENCOUNTER — Inpatient Hospital Stay

## 2023-12-08 ENCOUNTER — Encounter: Payer: Self-pay | Admitting: Internal Medicine

## 2023-12-08 ENCOUNTER — Inpatient Hospital Stay: Admitting: Internal Medicine

## 2023-12-08 VITALS — BP 139/82 | HR 80 | Temp 96.6°F | Resp 17 | Wt 221.0 lb

## 2023-12-08 DIAGNOSIS — E042 Nontoxic multinodular goiter: Secondary | ICD-10-CM | POA: Diagnosis not present

## 2023-12-08 DIAGNOSIS — C7B01 Secondary carcinoid tumors of distant lymph nodes: Secondary | ICD-10-CM | POA: Insufficient documentation

## 2023-12-08 DIAGNOSIS — E119 Type 2 diabetes mellitus without complications: Secondary | ICD-10-CM | POA: Diagnosis not present

## 2023-12-08 DIAGNOSIS — D630 Anemia in neoplastic disease: Secondary | ICD-10-CM | POA: Diagnosis not present

## 2023-12-08 DIAGNOSIS — C7A092 Malignant carcinoid tumor of the stomach: Secondary | ICD-10-CM | POA: Diagnosis present

## 2023-12-08 DIAGNOSIS — E041 Nontoxic single thyroid nodule: Secondary | ICD-10-CM

## 2023-12-08 DIAGNOSIS — C7A8 Other malignant neuroendocrine tumors: Secondary | ICD-10-CM | POA: Diagnosis not present

## 2023-12-08 DIAGNOSIS — R918 Other nonspecific abnormal finding of lung field: Secondary | ICD-10-CM | POA: Insufficient documentation

## 2023-12-08 LAB — CMP (CANCER CENTER ONLY)
ALT: 14 U/L (ref 0–44)
AST: 23 U/L (ref 15–41)
Albumin: 3.8 g/dL (ref 3.5–5.0)
Alkaline Phosphatase: 122 U/L (ref 38–126)
Anion gap: 9 (ref 5–15)
BUN: 13 mg/dL (ref 8–23)
CO2: 24 mmol/L (ref 22–32)
Calcium: 9 mg/dL (ref 8.9–10.3)
Chloride: 103 mmol/L (ref 98–111)
Creatinine: 1.06 mg/dL (ref 0.61–1.24)
GFR, Estimated: >60 mL/min (ref >60)
Glucose, Bld: 280 mg/dL — ABNORMAL HIGH (ref 70–99)
Potassium: 3.4 mmol/L — ABNORMAL LOW (ref 3.5–5.1)
Sodium: 136 mmol/L (ref 135–145)
Total Bilirubin: 0.9 mg/dL (ref 0.0–1.2)
Total Protein: 7.2 g/dL (ref 6.5–8.1)

## 2023-12-08 LAB — IRON AND TIBC
Iron: 45 ug/dL (ref 45–182)
Saturation Ratios: 11 % — ABNORMAL LOW (ref 17.9–39.5)
TIBC: 405 ug/dL (ref 250–450)
UIBC: 360 ug/dL

## 2023-12-08 LAB — CBC WITH DIFFERENTIAL (CANCER CENTER ONLY)
Abs Immature Granulocytes: 0.01 10*3/uL (ref 0.00–0.07)
Basophils Absolute: 0 10*3/uL (ref 0.0–0.1)
Basophils Relative: 0 %
Eosinophils Absolute: 0.1 10*3/uL (ref 0.0–0.5)
Eosinophils Relative: 3 %
HCT: 37.9 % — ABNORMAL LOW (ref 39.0–52.0)
Hemoglobin: 12.8 g/dL — ABNORMAL LOW (ref 13.0–17.0)
Immature Granulocytes: 0 %
Lymphocytes Relative: 24 %
Lymphs Abs: 1.2 10*3/uL (ref 0.7–4.0)
MCH: 24.2 pg — ABNORMAL LOW (ref 26.0–34.0)
MCHC: 33.8 g/dL (ref 30.0–36.0)
MCV: 71.5 fL — ABNORMAL LOW (ref 80.0–100.0)
Monocytes Absolute: 0.4 10*3/uL (ref 0.1–1.0)
Monocytes Relative: 9 %
Neutro Abs: 3.1 10*3/uL (ref 1.7–7.7)
Neutrophils Relative %: 64 %
Platelet Count: 278 10*3/uL (ref 150–400)
RBC: 5.3 MIL/uL (ref 4.22–5.81)
RDW: 18.1 % — ABNORMAL HIGH (ref 11.5–15.5)
WBC Count: 4.8 10*3/uL (ref 4.0–10.5)
nRBC: 0 % (ref 0.0–0.2)

## 2023-12-08 LAB — FERRITIN: Ferritin: 19 ng/mL — ABNORMAL LOW (ref 24–336)

## 2023-12-08 NOTE — Progress Notes (Signed)
 Had his gastrectomy 04/09/23. Doing well. Pt eating smaller amounts more frequently. Denies any stomach issues. Has lost about 20 lbs.

## 2023-12-08 NOTE — Progress Notes (Signed)
 [name]  Patient Care Team: [name], Jesus Clap, MD as PCP - General (Internal Medicine) Jesus Gutter, RN as Oncology Nurse Navigator Jesus Coder, MD as Consulting Physician (Oncology)  CHIEF COMPLAINTS/PURPOSE OF CONSULTATION: lung nodule/mass  # RLL nodule- ~2 cm SUV 1.4 [since 2008-slightly progressed]-benign versus slow-growing adenocarcinoma.  Small pulmonary nodules-  # Peri-Pancreatic mass 2.9 x 1.7 [SUV 6.3]/duodenum-? 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  # Brain Bleed [New Years Eve 2012; on asprin; [name]- spontaneous/conservative]  Oncology History Overview [name]    JULY 18th, 2024-subtotal gastrectomy with D2 dissection;[Dr.Shah; [facility]] histologic Type and Grade:    G1, well-differentiated neuroendocrine tumor     Mitotic Rate:    Less than 2 mitoses per 2 mm2     Ki-67 Labeling Index:    Less than 3%   Tumor Size:    Greatest dimension (Centimeters): 2.7 cm   Tumor Focality:    Multifocal     Number of Tumors:    at least 14   Tumor Extent:    Invades submucosa   Lymphovascular Invasion:    Present   Perineural Invasion:    Not identified   MARGINS   Margin Status:    All margins negative for tumor     Closest Margin(s) to Tumor:    Proximal     Distance from Tumor to Closest Margin:    0.2 cm   REGIONAL LYMPH NODES   Regional Lymph Node Status:        :    Tumor present in regional lymph node(s)       Number of Lymph Nodes with Tumor:    4     Number of Lymph Nodes Examined:    48   # 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 Jesus [name] he is no S/p laparoscopic subtotal gastrectomy 04/09/23. 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 Pt had several of the lesions with the biggest being 3-4 cm. Ordered Ga-Dotatate scan.  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 Hemoglobin improved current 13. Stable.  - MAY 8th, 2024- MRI- Unchanged lobulated fluid signal cystic lesion of the central pancreatic head measuring 1.4 x 1.2 cm. This is consistent with a side branch IPMN, and given well-established long-term stability, no further follow-up or characterization is required - Incidental thyroid nodule- 3.2 cm right thyroid nodule, stable since 05/23/2020; ultrasound March 2023-no further follow-up recommended. Left thyroid nodule-1 cm in size recommend annual ultrasound for total of 5 years. [2028]- MAY 2024- Thyroid US- Stable size of 1.1 cm left inferior thyroid nodule (nodule 2) which shows slight change in some characteristics but still falls under same categorization for 1 year follow-up ultrasound. - RLL nodule ~approximately 2 cm; SUV 1.8- slightly progressed over 13 years- benign etiology or very slowly growing adenocarcinoma. Repeat imaging-March 2022-stable unchanged. Radiologically considered benign. No further follow up is recommended- stable.    Primary malignant neuroendocrine tumor of stomach (HCC)  02/20/2023 Initial Diagnosis   Primary  malignant neuroendocrine tumor of stomach (HCC)   04/20/2023 Cancer Staging   Staging form: Neuroendocrine Tumor - Stomach, AJCC V9 - Clinical: Stage III (cT2, cN1, cM0) - Signed by Jesus Coder, MD on 04/20/2023     HISTORY OF PRESENTING ILLNESS: Patient ambulating-independently.  Alone.   Jesus [name] 68 y.o.  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  For follow-up  Patient had been following up with [name] surgery.  Overall clinically doing well.  Denies any blood in stools or black or stools.  Appetite is good.  No weight loss.  No fatigue. Denies any diarrhea or skin rash or flushing.   Denies any worsening shortness of breath or cough denies abdominal pain pain or nausea vomiting.  Unfortunately he stopped taking his diabetes medication.  Does admit to dietary indiscretion.   Review of Systems  Constitutional:  Negative for chills, diaphoresis, fever, malaise/fatigue and weight loss.  HENT:  Negative for nosebleeds and sore throat.   Eyes:  Negative for double vision.  Respiratory:  Negative for cough, hemoptysis, sputum production, shortness of breath and wheezing.   Cardiovascular:  Negative for chest pain, palpitations, orthopnea and leg swelling.  Gastrointestinal:  Negative for abdominal pain, blood in stool, constipation, diarrhea, heartburn, melena, nausea and vomiting.  Genitourinary:  Negative for dysuria, frequency and urgency.  Musculoskeletal:  Positive for back pain and joint pain.  Skin: Negative.  Negative for itching and rash.  Neurological:  Negative for dizziness, tingling, focal weakness, weakness and headaches.  Endo/Heme/Allergies:  Does not bruise/bleed easily.  Psychiatric/Behavioral:  Negative for depression. The patient is not nervous/anxious and does not have insomnia.      MEDICAL HISTORY:  Past Medical History:  Diagnosis Date   Chicken pox    Diabetes mellitus type 2, uncomplicated (HCC)    Diabetes mellitus without complication (HCC)    boarderline   Erectile dysfunction    GERD (gastroesophageal reflux disease)    History of kidney stones    Hypercholesterolemia    Hypertension    Stroke [name] 2012   hemorrhagic vessel    SURGICAL HISTORY: Past Surgical History:  Procedure  Laterality Date   CYSTOSCOPY W/ RETROGRADES  05/21/2020   Procedure: CYSTOSCOPY WITH RETROGRADE PYELOGRAM;  Surgeon: Vanna Scotland, MD;  Location: [facility];  Service: Urology;;   CYSTOSCOPY W/ URETERAL STENT PLACEMENT Left 07/09/2020   Procedure: CYSTOSCOPY WITH STENT REPLACEMENT;  Surgeon: Vanna Scotland, MD;  Location: [facility];  Service: Urology;  Laterality: Left;   CYSTOSCOPY WITH BIOPSY N/A 06/04/2020   Procedure: CYSTOSCOPY WITH bladder neck BIOPSY;  Surgeon: Vanna Scotland, MD;  Location: [facility];  Service: Urology;  Laterality: N/A;   CYSTOSCOPY/RETROGRADE/URETEROSCOPY Left 07/09/2020   Procedure: CYSTOSCOPY/RETROGRADE/URETEROSCOPY;  Surgeon: Vanna Scotland, MD;  Location: [facility];  Service: Urology;  Laterality: Left;   CYSTOSCOPY/URETEROSCOPY/HOLMIUM LASER/STENT PLACEMENT Left 05/21/2020   Procedure: CYSTOSCOPY/URETEROSCOPY/STENT PLACEMENT;  Surgeon: Vanna Scotland, MD;  Location: [facility];  Service: Urology;  Laterality: Left;   CYSTOSCOPY/URETEROSCOPY/HOLMIUM LASER/STENT PLACEMENT Left 06/04/2020   Procedure: CYSTOSCOPY/URETEROSCOPY/HOLMIUM LASER/STENT Exchange;  Surgeon: Vanna Scotland, MD;  Location: [facility];  Service: Urology;  Laterality: Left;   SHOULDER SURGERY Bilateral    clavicle   TOTAL HIP ARTHROPLASTY Left 08/17/2017   Procedure: TOTAL HIP ARTHROPLASTY;  Surgeon: Donato Heinz, MD;  Location: [facility];  Service: Orthopedics;  Laterality: Left;  TOTAL HIP REVISION Left 11/09/2020   Procedure: TOTAL HIP REVISION;  Surgeon: Donato Heinz, MD;  Location: [facility];  Service: Orthopedics;  Laterality: Left;   WRIST FUSION Left     SOCIAL HISTORY: Social History   Socioeconomic History   Marital status: Married    Spouse name: Alona Bene   Number of children: 4   Years of education: 12   Highest education level: High school graduate  Occupational History   Not on file  Tobacco Use   Smoking status: Never   Smokeless tobacco: Never  Vaping Use   Vaping  status: Never Used  Substance and Sexual Activity   Alcohol use: No   Drug use: No   Sexual activity: Not on file  Other Topics Concern   Not on file  Social History Narrative   No smoking no alcohol; used to work in Consulting civil engineer; 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 [place]. With home. 4 children.    Social Drivers of Corporate investment banker Strain: Low Risk  (04/17/2023)   Received from [facility]   Overall Financial Resource Strain (CARDIA)    Difficulty of Paying Living Expenses: Not hard at all  Food Insecurity: No Food Insecurity (04/17/2023)   Received from [facility]   Hunger Vital Sign    Ran Out of Food in the Last Year: Never true    Worried About Running Out of Food in the Last Year: Never true  Transportation Needs: No Transportation Needs (04/17/2023)   Received [facility]   PRAPARE - Transportation    Lack of Transportation (Non-Medical): No    In the past 12 months, has lack of transportation kept you from medical appointments or from getting medications?: No  Physical Activity: Not on file  Stress: Not on file  Social Connections: Not on file  Intimate Partner Violence: Not on file    FAMILY HISTORY: Family History  Adopted: Yes  Family history unknown: Yes    ALLERGIES:  is allergic to nsaids and aspirin.  MEDICATIONS:  Current Outpatient Medications  Medication Sig Dispense Refill   acetaminophen (TYLENOL) 325 MG tablet Take 325-650 mg by mouth every 4 (four) hours as needed for mild pain or headache.     amLODipine (NORVASC) 10 MG tablet Take 10 mg by mouth daily with breakfast.      lovastatin (MEVACOR) 40 MG tablet Take 40 mg by mouth daily.      omeprazole (PRILOSEC) 40 MG capsule Take 40 mg by mouth daily.      sildenafil (VIAGRA) 100 MG tablet Take 100 mg by mouth as needed for erectile dysfunction.      No current facility-administered medications for this visit.       Marland Kitchen  PHYSICAL EXAMINATION: ECOG PERFORMANCE STATUS: 0 - Asymptomatic  Vitals:   12/08/23 0912  BP: 139/82  Pulse: 80  Resp: 17  Temp: (!) 96.6 F (35.9 C)  SpO2: 100%     Filed Weights   12/08/23 0912  Weight: 221 lb (100.2 kg)      Physical Exam Constitutional:      Comments: Walking with a cane [recent hip surgery]; alone.  HENT:     Head: Normocephalic and atraumatic.     Mouth/Throat:     Pharynx: No oropharyngeal exudate.  Eyes:     Pupils: Pupils are equal, round, and reactive to light.  Cardiovascular:     Rate and Rhythm: Normal rate and regular  rhythm.  Pulmonary:     Effort: Pulmonary effort is normal. No respiratory distress.     Breath sounds: Normal breath sounds. No wheezing.  Abdominal:     General: Bowel sounds are normal. There is no distension.     Palpations: Abdomen is soft. There is no mass.     Tenderness: There is no abdominal tenderness. There is no guarding or rebound.  Musculoskeletal:        General: No tenderness. Normal range of motion.     Cervical back: Normal range of motion and neck supple.  Skin:    General: Skin is warm.  Neurological:     Mental Status: He is alert and oriented to person, place, and time.  Psychiatric:        Mood and Affect: Affect normal.      LABORATORY DATA:  I have reviewed the data as listed Lab Results  Component Value Date   WBC 4.8 12/08/2023   HGB 12.8 (L) 12/08/2023   HCT 37.9 (L) 12/08/2023   MCV 71.5 (L) 12/08/2023   PLT 278 12/08/2023   Recent Labs    01/30/23 1508 02/20/23 1340 12/08/23 0832  NA 137 137 136  K 3.3* 3.9 3.4*  CL 101 101 103  CO2 24 26 24   GLUCOSE 189* 116* 280*  BUN 10 14 13   CREATININE 1.05 1.12 1.06  CALCIUM 9.3 9.4 9.0  GFRNONAA >60 >60 >60  PROT 7.7 7.7 7.2  ALBUMIN 4.2 4.1 3.8  AST 25 19 23   ALT 16 18 14   ALKPHOS 107 119 122  BILITOT 0.8 0.6 0.9    RADIOGRAPHIC STUDIES: I have personally reviewed the radiological images as listed and agreed  with the findings in the report. No results found.   ASSESSMENT & PLAN:   Primary malignant neuroendocrine tumor of stomach [name]) # JULY 2024-D2 subtotal gastrectomy [Dr. Clelia Croft; [facility]]- # incidental gastrointestinal stromal tumor, spindle cell type, G1, low grade, 0.7 cm. Margins: Tumor extends to the inked and cauterized specimen edge.Pathologic stage: pT1N0.  No role for any adjuvant Gleevec.  # Portion of stomach, subtotal gastrectomy and D2 lymphadenectomy: Well-differentiated neuroendocrine tumor, G1, of the gastric fundus and body, multifocal, up to 2.7 cm. Tumor extent: Tumor invades submucosa. Margins: Negative for tumor (closest = proximal, 0.2 cm). Lymph nodes: Three of forty-four lymph nodes positive for metastatic well-differentiated neuroendocrine tumor (3/44); size of largest metastasis = 0.3 cm; extranodal extension = present; Pathologic stage: pT2(m)N1.D. Periduodenal lymph node, excision:One lymph node positive for metastatic well-differentiated neuroendocrine tumor (1/1). Size of metastasis: 3.5 cm. Extranodal extension: Present.  # February 2025 CT scan [facility]-no overt progression or recurrent disease; however lung nodules-mild abdominal adenopathy overall stable.  Will monitor closely given the risk of recurrence of well-differentiated neuroendocrine tumor is quite high given involvement of the lymph nodes.  However no role for any adjuvant therapy at this time.  Consider Sandostatin plus TKI with recurrence/ progression Check 24 hours urine 5 HIAA.   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  Hemoglobin improved current 12-13- stable.   # Incidental thyroid nodule- 3.2 cm right thyroid nodule, stable since 05/23/2020; ultrasound March 2023-no further follow-up recommended. However, FEB 2025-[facility]- CT scan- "increase in size of the thyroid cystic nodule".  Left thyroid nodule-1 cm in size recommend annual ultrasound for total of 5 years. [2028]-  MAY 2024- Thyroid US-  Stable size of 1.1 cm left inferior thyroid nodule (nodule 2) which shows slight change in some characteristics but still  falls under same categorization for 1 year follow-up ultrasound.  Recommend follow-up ultrasound-and also recommend ENT evaluation.  Referral made.  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 Repeat imaging-March 2022-stable unchanged. Radiologically considered benign. No further follow up is recommended- stable.   # DM- PBF- 280- recommend compliance with metformin/and dietary discretion- recommend follow up PCP.    # DISPOSITION: # Thyroid US- D[facility]  # referral to [facility]- re: thyoid nodule # follow up in 2 months- MD; NO labs- Dr.B  # I reviewed the blood work- with the patient in detail; also reviewed the imaging independently [as summarized above]; and with the patient in detail.    All questions were answered. The patient knows to call the clinic with any problems, questions or concerns.    Jesus Coder, MD 12/08/2023 10:07 AM

## 2023-12-08 NOTE — Assessment & Plan Note (Addendum)
#   JULY 2024-D2 subtotal gastrectomy [Dr. Clelia Croft; [facility]]- # incidental gastrointestinal stromal tumor, spindle cell type, G1, low grade, 0.7 cm. Margins: Tumor extends to the inked and cauterized specimen edge.Pathologic stage: pT1N0.  No role for any adjuvant Gleevec.  # Portion of stomach, subtotal gastrectomy and D2 lymphadenectomy: Well-differentiated neuroendocrine tumor, G1, of the gastric fundus and body, multifocal, up to 2.7 cm. Tumor extent: Tumor invades submucosa. Margins: Negative for tumor (closest = proximal, 0.2 cm). Lymph nodes: Three of forty-four lymph nodes positive for metastatic well-differentiated neuroendocrine tumor (3/44); size of largest metastasis = 0.3 cm; extranodal extension = present; Pathologic stage: pT2(m)N1.D. Periduodenal lymph node, excision:One lymph node positive for metastatic well-differentiated neuroendocrine tumor (1/1). Size of metastasis: 3.5 cm. Extranodal extension: Present.  # February 2025 CT scan [facility]-no overt progression or recurrent disease; however lung nodules-mild abdominal adenopathy overall stable.  Will monitor closely given the risk of recurrence of well-differentiated neuroendocrine tumor is quite high given involvement of the lymph nodes.  However no role for any adjuvant therapy at this time.  Consider Sandostatin plus TKI with recurrence/ progression Check 24 hours urine 5 HIAA.   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  Hemoglobin improved current 12-13- stable.   # Incidental thyroid nodule- 3.2 cm right thyroid nodule, stable since 05/23/2020; ultrasound March 2023-no further follow-up recommended. However, FEB 2025-[facility]- CT scan- "increase in size of the thyroid cystic nodule".  Left thyroid nodule-1 cm in size recommend annual ultrasound for total of 5 years. [2028]-  MAY 2024- Thyroid US- Stable size of 1.1 cm left inferior thyroid nodule (nodule 2) which shows slight change in some characteristics but still falls under  same categorization for 1 year follow-up ultrasound.  Recommend follow-up ultrasound-and also recommend ENT evaluation.  Referral made.  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 Repeat imaging-March 2022-stable unchanged. Radiologically considered benign. No further follow up is recommended- stable.   # DM- PBF- 280- recommend compliance with metformin/and dietary discretion- recommend follow up PCP.    # DISPOSITION: # Thyroid US- DRI  # referral to [facility]- re: thyoid nodule # follow up in 2 months- MD; NO labs- Dr.B  # I reviewed the blood work- with the patient in detail; also reviewed the imaging independently [as summarized above]; and with the patient in detail.

## 2023-12-09 ENCOUNTER — Ambulatory Visit
Admission: RE | Admit: 2023-12-09 | Discharge: 2023-12-09 | Discharge status: Home / Self Care | Source: Ambulatory Visit | Attending: Internal Medicine

## 2023-12-09 DIAGNOSIS — E041 Nontoxic single thyroid nodule: Secondary | ICD-10-CM

## 2023-12-10 ENCOUNTER — Other Ambulatory Visit: Payer: Self-pay

## 2023-12-10 DIAGNOSIS — C7A092 Malignant carcinoid tumor of the stomach: Secondary | ICD-10-CM | POA: Diagnosis not present

## 2023-12-10 DIAGNOSIS — C7A8 Other malignant neuroendocrine tumors: Secondary | ICD-10-CM

## 2023-12-10 LAB — CHROMOGRANIN A: Chromogranin A (ng/mL): 67.1 ng/mL (ref 0.0–101.8)

## 2023-12-11 ENCOUNTER — Encounter: Payer: Self-pay | Admitting: Internal Medicine

## 2023-12-17 LAB — 5 HIAA, QUANTITATIVE, URINE, 24 HOUR
5-HIAA, Ur: 1.7 mg/L
5-HIAA,Quant.,24 Hr Urine: 2.4 mg/(24.h) (ref 0.0–14.9)
Total Volume: 1400

## 2023-12-24 ENCOUNTER — Telehealth: Payer: Self-pay

## 2023-12-24 NOTE — Telephone Encounter (Signed)
 I spoke with Inetta Fermo at [facility], they spoke with pt, pt has appt with Dr. Willeen Cass on 01/04/24 at 2:45pm.

## 2024-02-08 ENCOUNTER — Inpatient Hospital Stay: Attending: Internal Medicine | Admitting: Internal Medicine

## 2024-02-08 ENCOUNTER — Encounter: Payer: Self-pay | Admitting: Internal Medicine

## 2024-02-08 VITALS — BP 117/81 | HR 65 | Temp 98.0°F | Resp 18 | Ht 68.0 in | Wt 220.0 lb

## 2024-02-08 DIAGNOSIS — E041 Nontoxic single thyroid nodule: Secondary | ICD-10-CM | POA: Insufficient documentation

## 2024-02-08 DIAGNOSIS — C7A8 Other malignant neuroendocrine tumors: Secondary | ICD-10-CM | POA: Diagnosis not present

## 2024-02-08 DIAGNOSIS — C7A092 Malignant carcinoid tumor of the stomach: Secondary | ICD-10-CM | POA: Diagnosis present

## 2024-02-08 DIAGNOSIS — C7B01 Secondary carcinoid tumors of distant lymph nodes: Secondary | ICD-10-CM | POA: Insufficient documentation

## 2024-02-08 DIAGNOSIS — D508 Other iron deficiency anemias: Secondary | ICD-10-CM | POA: Diagnosis not present

## 2024-02-08 DIAGNOSIS — E119 Type 2 diabetes mellitus without complications: Secondary | ICD-10-CM | POA: Diagnosis not present

## 2024-02-08 NOTE — Progress Notes (Signed)
 Thyroid  u/s 12/09/23.

## 2024-02-08 NOTE — Assessment & Plan Note (Addendum)
#   JULY 2024-D2 subtotal gastrectomy [Dr. Bernetta Brilliant; [facility]]- # incidental gastrointestinal stromal tumor, spindle cell type, G1, low grade, 0.7 cm.Margins: Tumor extends to the inked and cauterized specimen edge.Pathologic stage: pT1N0.  No role for any adjuvant Gleevec.  # Portion of stomach, subtotal gastrectomy and D2 lymphadenectomy: Well-differentiated neuroendocrine tumor, G1, of the gastric fundus and body, multifocal, up to 2.7 cm. Tumor extent: Tumor invades submucosa. Margins: Negative for tumor (closest = proximal, 0.2 cm). Lymph nodes: Three of forty-four lymph nodes positive for metastatic well-differentiated neuroendocrine tumor (3/44); size of largest metastasis = 0.3 cm; extranodal extension = present; Pathologic stage: pT2(m)N1.D. Periduodenal lymph node, excision:One lymph node positive for metastatic well-differentiated neuroendocrine tumor (1/1). Size of metastasis: 3.5 cm. Extranodal extension: Present.  # February 2025 CT scan [facility]-no overt progression or recurrent disease; however lung nodules-mild abdominal adenopathy overall stable.  Will monitor closely given the risk of recurrence of well-differentiated neuroendocrine tumor is quite high given involvement of the lymph nodes.  However no role for any adjuvant therapy at this time.  Consider Sandostatin plus TKI with recurrence/ progression. MARCH 2025- urine 5 HIAA.  Will get CT in 3 months.   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  Hemoglobin improved current 12-13- stable.   # Incidental thyroid  nodule- 3.2 cm right thyroid  nodule, stable since 05/23/2020; ultrasound March 2023-no further follow-up recommended. However, FEB 2025-[facility]- CT scan- "increase in size of the thyroid  cystic nodule".  Left thyroid  nodule-1 cm MARCH 2025- Thyroid  US - STABLE-  S/p evaluation- [facility]- re: thyoid nodule-  no surveillance needed.   # DM- PBF-trying to lose weight; compliance with metformin /and dietary discretion-  stable-     # DISPOSITION: # [facility]- re: CT from feb [facility]- [facility] # follow up in 3 months- MD;  labs-[facility]; iron studies; ferritin- DRI-CT CAP-  Dr.B

## 2024-02-08 NOTE — Progress Notes (Signed)
 [facility] CONSULT NOTE  Patient Care Team: [name], Iantha Mainland, MD as PCP - General (Internal Medicine) Jesus Chroman, RN as Oncology Nurse Navigator Jesus Leos, MD as Consulting Physician (Oncology)  CHIEF COMPLAINTS/PURPOSE OF CONSULTATION: lung nodule/mass  # RLL nodule- ~2 cm SUV 1.4 [since 2008-slightly progressed]-benign versus slow-growing adenocarcinoma.  Small pulmonary nodules-  # Peri-Pancreatic mass 2.9 x 1.7 [SUV 6.3]/duodenum-? 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  # Brain Bleed [New Years Eve 2012; on asprin; [facility]- spontaneous/conservative]  Oncology History Overview Note    JULY 18th, 2024-subtotal gastrectomy with D2 dissection;[Dr.Shah; [facility]] histologic Type and Grade:    G1, well-differentiated neuroendocrine tumor     Mitotic Rate:    Less than 2 mitoses per 2 mm2     Ki-67 Labeling Index:    Less than 3%   Tumor Size:    Greatest dimension (Centimeters): 2.7 cm   Tumor Focality:    Multifocal     Number of Tumors:    at least 14   Tumor Extent:    Invades submucosa   Lymphovascular Invasion:    Present   Perineural Invasion:    Not identified   MARGINS   Margin Status:    All margins negative for tumor     Closest Margin(s) to Tumor:    Proximal     Distance from Tumor to Closest Margin:    0.2 cm   REGIONAL LYMPH NODES   Regional Lymph Node Status:        :    Tumor present in regional lymph node(s)       Number of Lymph Nodes with Tumor:    4     Number of Lymph Nodes Examined:    48   # 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 [name] he is no S/p laparoscopic subtotal gastrectomy 04/09/23. 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 Pt had several of the lesions with the biggest being 3-4 cm. Ordered Ga-Dotatate scan.  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 Hemoglobin improved current 13. Stable.  - MAY 8th, 2024- MRI- Unchanged lobulated fluid signal cystic lesion of the central pancreatic head measuring 1.4 x 1.2 cm. This is consistent with a side branch IPMN, and given well-established long-term stability, no further follow-up or characterization is required - Incidental thyroid  nodule- 3.2 cm right thyroid  nodule, stable since 05/23/2020; ultrasound March 2023-no further follow-up recommended. Left thyroid  nodule-1 cm in size recommend annual ultrasound for total of 5 years. [2028]- MAY 2024- Thyroid  US - Stable size of 1.1 cm left inferior thyroid  nodule (nodule 2) which shows slight change in some characteristics but still falls under same categorization for 1 year follow-up ultrasound. - RLL nodule ~approximately 2 cm; SUV 1.8- slightly progressed over 13 years- benign etiology or very slowly growing adenocarcinoma. Repeat imaging-March 2022-stable unchanged. Radiologically considered benign. No further follow up is recommended- stable.    Primary malignant neuroendocrine tumor of stomach (HCC)  02/20/2023 Initial Diagnosis   Primary  malignant neuroendocrine tumor of stomach (HCC)   04/20/2023 Cancer Staging   Staging form: Neuroendocrine Tumor - Stomach, AJCC V9 - Clinical: Stage III (cT2, cN1, cM0) - Signed by Jesus Leos, MD on 04/20/2023     HISTORY OF PRESENTING ILLNESS: Patient ambulating-independently.  Alone.   Jesus Bell 68 y.o.  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  For follow-up on Thyroid  u/s 12/09/23.  Patient s/p evaluation-with ENT.   Overall clinically doing well. Denies any blood in stools or black or stools.  Appetite is good.  No weight loss.  No fatigue. Denies any diarrhea or skin rash or flushing.   Denies any worsening shortness of breath or cough denies abdominal pain pain or nausea vomiting.   Review of Systems  Constitutional:  Negative for chills, diaphoresis, fever, malaise/fatigue and weight loss.  HENT:  Negative for nosebleeds and sore throat.   Eyes:  Negative for double vision.  Respiratory:  Negative for cough, hemoptysis, sputum production, shortness of breath and wheezing.   Cardiovascular:  Negative for chest pain, palpitations, orthopnea and leg swelling.  Gastrointestinal:  Negative for abdominal pain, blood in stool, constipation, diarrhea, heartburn, melena, nausea and vomiting.  Genitourinary:  Negative for dysuria, frequency and urgency.  Musculoskeletal:  Positive for back pain and joint pain.  Skin: Negative.  Negative for itching and rash.  Neurological:  Negative for dizziness, tingling, focal weakness, weakness and headaches.  Endo/Heme/Allergies:  Does not bruise/bleed easily.  Psychiatric/Behavioral:  Negative for depression. The patient is not nervous/anxious and does not have insomnia.      MEDICAL HISTORY:  Past Medical History:  Diagnosis Date   Chicken pox    Diabetes mellitus type 2, uncomplicated (HCC)    Diabetes mellitus without complication (HCC)    boarderline   Erectile dysfunction    GERD (gastroesophageal reflux disease)    History of kidney stones    Hypercholesterolemia    Hypertension    Stroke [facility]) 2012   hemorrhagic vessel    SURGICAL HISTORY: Past Surgical History:  Procedure Laterality Date   CYSTOSCOPY W/ RETROGRADES  05/21/2020   Procedure: CYSTOSCOPY WITH  RETROGRADE PYELOGRAM;  Surgeon: Dustin Gimenez, MD;  Location: [facility] ORS;  Service: Urology;;   CYSTOSCOPY W/ URETERAL STENT PLACEMENT Left 07/09/2020   Procedure: CYSTOSCOPY WITH STENT REPLACEMENT;  Surgeon: Dustin Gimenez, MD;  Location: [facility] ORS;  Service: Urology;  Laterality: Left;   CYSTOSCOPY WITH BIOPSY N/A 06/04/2020   Procedure: CYSTOSCOPY WITH bladder neck BIOPSY;  Surgeon: Dustin Gimenez, MD;  Location: [facility] ORS;  Service: Urology;  Laterality: N/A;   CYSTOSCOPY/RETROGRADE/URETEROSCOPY Left 07/09/2020   Procedure: CYSTOSCOPY/RETROGRADE/URETEROSCOPY;  Surgeon: Dustin Gimenez, MD;  Location: [facility] ORS;  Service: Urology;  Laterality: Left;   CYSTOSCOPY/URETEROSCOPY/HOLMIUM LASER/STENT PLACEMENT Left 05/21/2020   Procedure: CYSTOSCOPY/URETEROSCOPY/STENT PLACEMENT;  Surgeon: Dustin Gimenez, MD;  Location: [facility] ORS;  Service: Urology;  Laterality: Left;   CYSTOSCOPY/URETEROSCOPY/HOLMIUM LASER/STENT PLACEMENT Left 06/04/2020   Procedure: CYSTOSCOPY/URETEROSCOPY/HOLMIUM LASER/STENT Exchange;  Surgeon: Dustin Gimenez, MD;  Location: [facility] ORS;  Service: Urology;  Laterality: Left;   SHOULDER SURGERY Bilateral    clavicle   TOTAL HIP ARTHROPLASTY Left 08/17/2017   Procedure: TOTAL HIP ARTHROPLASTY;  Surgeon: Arlyne Lame, MD;  Location: [facility] ORS;  Service: Orthopedics;  Laterality: Left;   TOTAL HIP REVISION Left 11/09/2020   Procedure: TOTAL HIP REVISION;  Surgeon: Arlyne Lame, MD;  Location: [facility] OR[facility];  Service: Orthopedics;  Laterality: Left;   WRIST FUSION Left     SOCIAL HISTORY: Social History   Socioeconomic History   Marital status: Married    Spouse name: Lenon Radar   Number of children: 4   Years of education: 12   Highest education level: High school graduate  Occupational History   Not on file  Tobacco Use   Smoking status: Never   Smokeless tobacco: Never  Vaping Use   Vaping status: Never Used  Substance and Sexual Activity   Alcohol use: No   Drug use: No    Sexual activity: Not on file  Other Topics Concern   Not on file  Social History Narrative   No smoking no alcohol; used to work in Consulting civil engineer; 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 [place] With home. 4 children.    Social Drivers of Corporate investment banker Strain: Low Risk  (04/17/2023)   Received from [facility]   Overall Financial Resource Strain (CARDIA)    Difficulty of Paying Living Expenses: Not hard at all  Food Insecurity: No Food Insecurity (04/17/2023)   Received from [facility]   Hunger Vital Sign    Ran Out of Food in the Last Year: Never true    Worried About Running Out of Food in the Last Year: Never true  Transportation Needs: No Transportation Needs (04/17/2023)   Received from [facility]   PRAPARE - Transportation    Lack of Transportation (Non-Medical): No    In the past 12 months, has lack of transportation kept you from medical appointments or from getting medications?: No  Physical Activity: Not on file  Stress: Not on file  Social Connections: Not on file  Intimate Partner Violence: Not At Risk (02/08/2024)   Humiliation, Afraid, Rape, and Kick questionnaire    Fear of Current or Ex-Partner: No    Emotionally Abused: No    Physically Abused: No    Sexually Abused: No    FAMILY HISTORY: Family History  Adopted: Yes  Family history unknown: Yes    ALLERGIES:  is allergic to nsaids and aspirin.  MEDICATIONS:  Current Outpatient Medications  Medication Sig Dispense Refill   acetaminophen  (TYLENOL ) 325 MG tablet Take 325-650 mg by mouth every 4 (four) hours as needed for mild pain or headache.     amLODipine  (NORVASC ) 10 MG tablet Take 10 mg by mouth daily with breakfast.      lovastatin (MEVACOR) 40 MG tablet Take 40 mg by mouth daily.      omeprazole (PRILOSEC) 40 MG capsule Take 40 mg by mouth daily.      sildenafil (VIAGRA) 100 MG tablet Take 100 mg by mouth as needed for  erectile dysfunction.      No current facility-administered medications for this visit.      Aaron Aas  PHYSICAL EXAMINATION: ECOG PERFORMANCE STATUS: 0 - Asymptomatic  Vitals:   02/08/24 0953  BP: 117/81  Pulse: 65  Resp: 18  Temp: 98 F (36.7 C)  SpO2: 99%     Filed Weights   02/08/24 0953  Weight: 220 lb (99.8 kg)      Physical Exam Constitutional:      Comments: Walking with a cane [recent hip surgery]; alone.  HENT:     Head: Normocephalic and atraumatic.     Mouth/Throat:     Pharynx: No oropharyngeal exudate.  Eyes:  Pupils: Pupils are equal, round, and reactive to light.  Cardiovascular:     Rate and Rhythm: Normal rate and regular rhythm.  Pulmonary:     Effort: Pulmonary effort is normal. No respiratory distress.     Breath sounds: Normal breath sounds. No wheezing.  Abdominal:     General: Bowel sounds are normal. There is no distension.     Palpations: Abdomen is soft. There is no mass.     Tenderness: There is no abdominal tenderness. There is no guarding or rebound.  Musculoskeletal:        General: No tenderness. Normal range of motion.     Cervical back: Normal range of motion and neck supple.  Skin:    General: Skin is warm.  Neurological:     Mental Status: He is alert and oriented to person, place, and time.  Psychiatric:        Mood and Affect: Affect normal.      LABORATORY DATA:  I have reviewed the data as listed Lab Results  Component Value Date   WBC 4.8 12/08/2023   HGB 12.8 (L) 12/08/2023   HCT 37.9 (L) 12/08/2023   MCV 71.5 (L) 12/08/2023   PLT 278 12/08/2023   Recent Labs    02/20/23 1340 12/08/23 0832  NA 137 136  K 3.9 3.4*  CL 101 103  CO2 26 24  GLUCOSE 116* 280*  BUN 14 13  CREATININE 1.12 1.06  CALCIUM 9.4 9.0  GFRNONAA >60 >60  PROT 7.7 7.2  ALBUMIN  4.1 3.8  AST 19 23  ALT 18 14  ALKPHOS 119 122  BILITOT 0.6 0.9    RADIOGRAPHIC STUDIES: I have personally reviewed the radiological images as  listed and agreed with the findings in the report. No results found.   ASSESSMENT & PLAN:   Primary malignant neuroendocrine tumor of stomach [facility]) # JULY 2024-D2 subtotal gastrectomy [Dr. Bernetta Brilliant; [facility]]- # incidental gastrointestinal stromal tumor, spindle cell type, G1, low grade, 0.7 cm.Margins: Tumor extends to the inked and cauterized specimen edge.Pathologic stage: pT1N0.  No role for any adjuvant Gleevec.  # Portion of stomach, subtotal gastrectomy and D2 lymphadenectomy: Well-differentiated neuroendocrine tumor, G1, of the gastric fundus and body, multifocal, up to 2.7 cm. Tumor extent: Tumor invades submucosa. Margins: Negative for tumor (closest = proximal, 0.2 cm). Lymph nodes: Three of forty-four lymph nodes positive for metastatic well-differentiated neuroendocrine tumor (3/44); size of largest metastasis = 0.3 cm; extranodal extension = present; Pathologic stage: pT2(m)N1.D. Periduodenal lymph node, excision:One lymph node positive for metastatic well-differentiated neuroendocrine tumor (1/1). Size of metastasis: 3.5 cm. Extranodal extension: Present.  # February 2025 CT scan [facility]-no overt progression or recurrent disease; however lung nodules-mild abdominal adenopathy overall stable.  Will monitor closely given the risk of recurrence of well-differentiated neuroendocrine tumor is quite high given involvement of the lymph nodes.  However no role for any adjuvant therapy at this time.  Consider Sandostatin plus TKI with recurrence/ progression. MARCH 2025- urine 5 HIAA.  Will get CT in 3 months.   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  Hemoglobin improved current 12-13- stable.   # Incidental thyroid  nodule- 3.2 cm right thyroid  nodule, stable since 05/23/2020; ultrasound March 2023-no further follow-up recommended. However, FEB 2025-[facility]- CT scan- "increase in size of the thyroid  cystic nodule".  Left thyroid  nodule-1 cm MARCH 2025- Thyroid  US - STABLE-  S/p evaluation-  [facility]- re: thyoid nodule-  no surveillance needed.   # DM- PBF-trying to lose weight; compliance  with metformin /and dietary discretion-  stable-    # DISPOSITION: # Jesus Bell- re: CT from feb [facility]- [facility] # follow up in 3 months- MD;  labs-[facility]; iron studies; ferritin- [facility]-CT CAP-  Dr.B    All questions were answered. The patient knows to call the clinic with any problems, questions or concerns.    Jesus Leos, MD 02/08/2024 10:52 AM

## 2024-04-08 ENCOUNTER — Encounter: Payer: Self-pay | Admitting: Advanced Practice Midwife

## 2024-05-10 ENCOUNTER — Ambulatory Visit
Admission: RE | Admit: 2024-05-10 | Discharge: 2024-05-10 | Disposition: A | Discharge status: Home / Self Care | Source: Ambulatory Visit | Attending: Internal Medicine | Admitting: Internal Medicine

## 2024-05-10 DIAGNOSIS — C7A8 Other malignant neuroendocrine tumors: Secondary | ICD-10-CM

## 2024-05-10 MED ORDER — IOPAMIDOL (ISOVUE-300) INJECTION 61%
100.0000 mL | Freq: Once | INTRAVENOUS | Status: AC | PRN
  Administered 2024-05-10: 100 mL via INTRAVENOUS

## 2024-05-11 ENCOUNTER — Ambulatory Visit: Admitting: Internal Medicine

## 2024-05-11 ENCOUNTER — Other Ambulatory Visit

## 2024-05-20 ENCOUNTER — Inpatient Hospital Stay (HOSPITAL_BASED_OUTPATIENT_CLINIC_OR_DEPARTMENT_OTHER): Admitting: Internal Medicine

## 2024-05-20 ENCOUNTER — Inpatient Hospital Stay: Attending: Internal Medicine

## 2024-05-20 ENCOUNTER — Encounter: Payer: Self-pay | Admitting: Internal Medicine

## 2024-05-20 VITALS — BP 118/80 | HR 68 | Temp 98.3°F | Ht 68.0 in | Wt 219.0 lb

## 2024-05-20 DIAGNOSIS — R918 Other nonspecific abnormal finding of lung field: Secondary | ICD-10-CM | POA: Diagnosis not present

## 2024-05-20 DIAGNOSIS — E0789 Other specified disorders of thyroid: Secondary | ICD-10-CM | POA: Diagnosis not present

## 2024-05-20 DIAGNOSIS — C7B8 Other secondary neuroendocrine tumors: Secondary | ICD-10-CM | POA: Diagnosis not present

## 2024-05-20 DIAGNOSIS — C7A8 Other malignant neuroendocrine tumors: Secondary | ICD-10-CM | POA: Insufficient documentation

## 2024-05-20 DIAGNOSIS — D649 Anemia, unspecified: Secondary | ICD-10-CM | POA: Insufficient documentation

## 2024-05-20 DIAGNOSIS — C49A2 Gastrointestinal stromal tumor of stomach: Secondary | ICD-10-CM | POA: Insufficient documentation

## 2024-05-20 DIAGNOSIS — E119 Type 2 diabetes mellitus without complications: Secondary | ICD-10-CM | POA: Diagnosis not present

## 2024-05-20 LAB — CBC WITH DIFFERENTIAL (CANCER CENTER ONLY)
Abs Immature Granulocytes: 0.01 K/uL (ref 0.00–0.07)
Basophils Absolute: 0 K/uL (ref 0.0–0.1)
Basophils Relative: 0 %
Eosinophils Absolute: 0.1 K/uL (ref 0.0–0.5)
Eosinophils Relative: 3 %
HCT: 37.5 % — ABNORMAL LOW (ref 39.0–52.0)
Hemoglobin: 12.9 g/dL — ABNORMAL LOW (ref 13.0–17.0)
Immature Granulocytes: 0 %
Lymphocytes Relative: 19 %
Lymphs Abs: 0.9 K/uL (ref 0.7–4.0)
MCH: 24.3 pg — ABNORMAL LOW (ref 26.0–34.0)
MCHC: 34.4 g/dL (ref 30.0–36.0)
MCV: 70.8 fL — ABNORMAL LOW (ref 80.0–100.0)
Monocytes Absolute: 0.6 K/uL (ref 0.1–1.0)
Monocytes Relative: 12 %
Neutro Abs: 3.2 K/uL (ref 1.7–7.7)
Neutrophils Relative %: 66 %
Platelet Count: 296 K/uL (ref 150–400)
RBC: 5.3 MIL/uL (ref 4.22–5.81)
RDW: 16.4 % — ABNORMAL HIGH (ref 11.5–15.5)
WBC Count: 4.9 K/uL (ref 4.0–10.5)
nRBC: 0 % (ref 0.0–0.2)

## 2024-05-20 LAB — CMP (CANCER CENTER ONLY)
ALT: 12 U/L (ref 0–44)
AST: 22 U/L (ref 15–41)
Albumin: 4.1 g/dL (ref 3.5–5.0)
Alkaline Phosphatase: 128 U/L — ABNORMAL HIGH (ref 38–126)
Anion gap: 8 (ref 5–15)
BUN: 13 mg/dL (ref 8–23)
CO2: 24 mmol/L (ref 22–32)
Calcium: 9.4 mg/dL (ref 8.9–10.3)
Chloride: 103 mmol/L (ref 98–111)
Creatinine: 1.14 mg/dL (ref 0.61–1.24)
GFR, Estimated: >60 mL/min (ref >60)
Glucose, Bld: 130 mg/dL — ABNORMAL HIGH (ref 70–99)
Potassium: 3.9 mmol/L (ref 3.5–5.1)
Sodium: 135 mmol/L (ref 135–145)
Total Bilirubin: 1.2 mg/dL (ref 0.0–1.2)
Total Protein: 7.5 g/dL (ref 6.5–8.1)

## 2024-05-20 LAB — IRON AND TIBC
Iron: 52 ug/dL (ref 45–182)
Saturation Ratios: 13 % — ABNORMAL LOW (ref 17.9–39.5)
TIBC: 405 ug/dL (ref 250–450)
UIBC: 353 ug/dL

## 2024-05-20 LAB — FERRITIN: Ferritin: 25 ng/mL (ref 24–336)

## 2024-05-20 NOTE — Progress Notes (Signed)
 CT CAP 05/10/24. Called for read. Was seen at [facility] upper GI 05/18/24, Dr. Jama.

## 2024-05-20 NOTE — Progress Notes (Signed)
  Cancer Center CONSULT NOTE  Patient Care Team: [name], [name] LABOR, MD as PCP - General (Internal Medicine) [name], RN as Oncology Nurse Navigator [name], MD as Consulting Physician (Oncology)  CHIEF COMPLAINTS/PURPOSE OF CONSULTATION: lung nodule/mass  # RLL nodule- ~2 cm SUV 1.4 [since 2008-slightly progressed]-benign versus slow-growing adenocarcinoma.  Small pulmonary nodules-  # Peri-Pancreatic mass 2.9 x 1.7 [SUV 6.3]/duodenum-? 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  # Brain Bleed [New Years Eve 2012; on asprin; [facility]- spontaneous/conservative]  Oncology History Overview Note    JULY 18th, 2024-subtotal gastrectomy with D2 dissection;[Dr.Shah; [facility]] histologic Type and Grade:    G1, well-differentiated neuroendocrine tumor     Mitotic Rate:    Less than 2 mitoses per 2 mm2     Ki-67 Labeling Index:    Less than 3%   Tumor Size:    Greatest dimension (Centimeters): 2.7 cm   Tumor Focality:    Multifocal     Number of Tumors:    at least 14   Tumor Extent:    Invades submucosa   Lymphovascular Invasion:    Present   Perineural Invasion:    Not identified   MARGINS   Margin Status:    All margins negative for tumor     Closest Margin(s) to Tumor:    Proximal     Distance from Tumor to Closest Margin:    0.2 cm   REGIONAL LYMPH NODES   Regional Lymph Node Status:        :    Tumor present in regional lymph node(s)       Number of Lymph Nodes with Tumor:    4     Number of Lymph Nodes Examined:    48   # 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 Emery Dupuy Pellum he is no S/p laparoscopic subtotal gastrectomy 04/09/23. 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 Pt had several of the lesions with the biggest being 3-4 cm. Ordered Ga-Dotatate scan.  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 Hemoglobin improved current 13. Stable.  - MAY 8th, 2024- MRI- Unchanged lobulated fluid signal cystic lesion of the central pancreatic head measuring 1.4 x 1.2 cm. This is consistent with a side branch IPMN, and given well-established long-term stability, no further follow-up or characterization is required - Incidental thyroid  nodule- 3.2 cm right thyroid  nodule, stable since 05/23/2020; ultrasound March 2023-no further follow-up recommended. Left thyroid  nodule-1 cm in size recommend annual ultrasound for total of 5 years. [2028]- MAY 2024- Thyroid  US - Stable size of 1.1 cm left inferior thyroid  nodule (nodule 2) which shows slight change in some characteristics but still falls under same categorization for 1 year follow-up ultrasound. - RLL nodule ~approximately 2 cm; SUV 1.8- slightly progressed over 13 years- benign etiology or very slowly growing adenocarcinoma. Repeat imaging-March 2022-stable unchanged. Radiologically considered benign. No further follow up is recommended- stable.    # Incidental thyroid  nodule- 3.2 cm right thyroid  nodule, stable since 05/23/2020; ultrasound March  2023-no further follow-up recommended. However, FEB 2025-[facility]- CT scan- increase in size of the thyroid  cystic nodule.  Left thyroid  nodule-1 cm MARCH 2025- Thyroid  US - STABLE-  S/p evaluation- [facility]- re: thyoid nodule-  no surveillance needed.    Primary malignant neuroendocrine tumor of stomach (HCC)  02/20/2023 Initial Diagnosis   Primary malignant neuroendocrine tumor of stomach (HCC)   04/20/2023 Cancer Staging    Staging form: Neuroendocrine Tumor - Stomach, AJCC V9 - Clinical: Stage III (cT2, cN1, cM0) - Signed by [name], MD on 04/20/2023     HISTORY OF PRESENTING ILLNESS: Patient ambulating-independently.  Alone.   Jesus Bell 68 y.o.  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  In the interim underwent evaluation with endoscopic ultrasound at [facility] obvious evidence of recurrence.  Awaiting pathology results.   Overall clinically doing well. Denies any blood in stools or black or stools.  Appetite is good.  No weight loss.  No fatigue. Denies any diarrhea or skin rash or flushing.   Denies any worsening shortness of breath or cough denies abdominal pain pain or nausea vomiting.   Review of Systems  Constitutional:  Negative for chills, diaphoresis, fever, malaise/fatigue and weight loss.  HENT:  Negative for nosebleeds and sore throat.   Eyes:  Negative for double vision.  Respiratory:  Negative for cough, hemoptysis, sputum production, shortness of breath and wheezing.   Cardiovascular:  Negative for chest pain, palpitations, orthopnea and leg swelling.  Gastrointestinal:  Negative for abdominal pain, blood in stool, constipation, diarrhea, heartburn, melena, nausea and vomiting.  Genitourinary:  Negative for dysuria, frequency and urgency.  Musculoskeletal:  Positive for back pain and joint pain.  Skin: Negative.  Negative for itching and rash.  Neurological:  Negative for dizziness, tingling, focal weakness, weakness and headaches.  Endo/Heme/Allergies:  Does not bruise/bleed easily.  Psychiatric/Behavioral:  Negative for depression. The patient is not nervous/anxious and does not have insomnia.      MEDICAL HISTORY:  Past Medical History:   Diagnosis Date   Chicken pox    Diabetes mellitus type 2, uncomplicated (HCC)    Diabetes mellitus without complication (HCC)    boarderline   Erectile dysfunction    GERD (gastroesophageal reflux disease)    History of kidney stones    Hypercholesterolemia    Hypertension    Stroke [facility]) 2012   hemorrhagic vessel    SURGICAL HISTORY: Past Surgical History:  Procedure Laterality Date   CYSTOSCOPY W/ RETROGRADES  05/21/2020   Procedure: CYSTOSCOPY WITH RETROGRADE PYELOGRAM;  Surgeon: Penne Knee, MD;  Location: [facility] ORS;  Service: Urology;;   CYSTOSCOPY W/ URETERAL STENT PLACEMENT Left 07/09/2020   Procedure: CYSTOSCOPY WITH STENT REPLACEMENT;  Surgeon: Penne Knee, MD;  Location: [facility] ORS;  Service: Urology;  Laterality: Left;   CYSTOSCOPY WITH BIOPSY N/A 06/04/2020   Procedure: CYSTOSCOPY WITH bladder neck BIOPSY;  Surgeon: Penne Knee, MD;  Location: [facility] ORS;  Service: Urology;  Laterality: N/A;   CYSTOSCOPY/RETROGRADE/URETEROSCOPY Left 07/09/2020   Procedure: CYSTOSCOPY/RETROGRADE/URETEROSCOPY;  Surgeon: Penne Knee, MD;  Location: [facility] ORS;  Service: Urology;  Laterality: Left;   CYSTOSCOPY/URETEROSCOPY/HOLMIUM LASER/STENT PLACEMENT Left 05/21/2020   Procedure: CYSTOSCOPY/URETEROSCOPY/STENT PLACEMENT;  Surgeon: Penne Knee, MD;  Location: [facility] ORS;  Service: Urology;  Laterality: Left;   CYSTOSCOPY/URETEROSCOPY/HOLMIUM LASER/STENT PLACEMENT Left  06/04/2020   Procedure: CYSTOSCOPY/URETEROSCOPY/HOLMIUM LASER/STENT Exchange;  Surgeon: Penne Knee, MD;  Location: [facility] ORS;  Service: Urology;  Laterality: Left;   SHOULDER SURGERY Bilateral    clavicle   TOTAL HIP ARTHROPLASTY Left 08/17/2017   Procedure: TOTAL HIP ARTHROPLASTY;  Surgeon: Mardee Lynwood SQUIBB, MD;  Location: [facility] ORS;  Service: Orthopedics;  Laterality: Left;   TOTAL HIP REVISION Left 11/09/2020   Procedure: TOTAL HIP REVISION;  Surgeon: Mardee Lynwood SQUIBB, MD;  Location: [facility] OR[facility];  Service: Orthopedics;   Laterality: Left;   WRIST FUSION Left     SOCIAL HISTORY: Social History   Socioeconomic History   Marital status: Married    Spouse name: Merlynn   Number of children: 4   Years of education: 12   Highest education level: High school graduate  Occupational History   Not on file  Tobacco Use   Smoking status: Never   Smokeless tobacco: Never  Vaping Use   Vaping status: Never Used  Substance and Sexual Activity   Alcohol use: No   Drug use: No   Sexual activity: Not on file  Other Topics Concern   Not on file  Social History Narrative   No smoking no alcohol; used to work in Consulting civil engineer; 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 [place]. With home. 4 children.    Social Drivers of Corporate investment banker Strain: Low Risk  (04/17/2023)   Received from [facility]   Overall Financial Resource Strain (CARDIA)    Difficulty of Paying Living Expenses: Not hard at all  Food Insecurity: No Food Insecurity (04/17/2023)   Received from [facility]   Hunger Vital Sign    Within the past 12 months, the food you bought just didn't last and you didn't have money to get more.: Never true    Within the past 12 months, you worried that your food would run out before you got the money to buy more.: Never true  Transportation Needs: No Transportation Needs (04/17/2023)   Received from [facility]   PRAPARE - Transportation    Lack of Transportation (Non-Medical): No    In the past 12 months, has lack of transportation kept you from medical appointments or from getting medications?: No  Physical Activity: Not on file  Stress: Not on file  Social Connections: Not on file  Intimate Partner Violence: Not At Risk (02/08/2024)   Humiliation, Afraid, Rape, and Kick questionnaire    Fear of Current or Ex-Partner: No    Emotionally Abused: No    Physically Abused: No    Sexually Abused: No    FAMILY HISTORY: Family History   Adopted: Yes  Family history unknown: Yes    ALLERGIES:  is allergic to nsaids and aspirin.  MEDICATIONS:  Current Outpatient Medications  Medication Sig Dispense Refill   acetaminophen  (TYLENOL ) 325 MG tablet Take 325-650 mg by mouth every 4 (four) hours as needed for mild pain or headache.     amLODipine  (NORVASC ) 10 MG tablet Take 10 mg by mouth daily with breakfast.      lovastatin (MEVACOR) 40 MG tablet Take 40 mg by mouth daily.      omeprazole (PRILOSEC) 40 MG capsule Take 40 mg by mouth daily.      sildenafil (VIAGRA) 100 MG tablet Take 100 mg by mouth as needed for erectile dysfunction.      No current facility-administered medications for this visit.      SABRA  PHYSICAL  EXAMINATION: ECOG PERFORMANCE STATUS: 0 - Asymptomatic  Vitals:   05/20/24 1250  BP: 118/80  Pulse: 68  Temp: 98.3 F (36.8 C)  SpO2: 99%     Filed Weights   05/20/24 1250  Weight: 99.3 kg      Physical Exam Constitutional:      Comments: Walking with a cane [recent hip surgery]; alone.  HENT:     Head: Normocephalic and atraumatic.     Mouth/Throat:     Pharynx: No oropharyngeal exudate.  Eyes:     Pupils: Pupils are equal, round, and reactive to light.  Cardiovascular:     Rate and Rhythm: Normal rate and regular rhythm.  Pulmonary:     Effort: Pulmonary effort is normal. No respiratory distress.     Breath sounds: Normal breath sounds. No wheezing.  Abdominal:     General: Bowel sounds are normal. There is no distension.     Palpations: Abdomen is soft. There is no mass.     Tenderness: There is no abdominal tenderness. There is no guarding or rebound.  Musculoskeletal:        General: No tenderness. Normal range of motion.     Cervical back: Normal range of motion and neck supple.  Skin:    General: Skin is warm.  Neurological:     Mental Status: He is alert and oriented to person, place, and time.  Psychiatric:        Mood and Affect: Affect normal.      LABORATORY  DATA:  I have reviewed the data as listed Lab Results  Component Value Date   WBC 4.9 05/20/2024   HGB 12.9 (L) 05/20/2024   HCT 37.5 (L) 05/20/2024   MCV 70.8 (L) 05/20/2024   PLT 296 05/20/2024   Recent Labs    12/08/23 0832 05/20/24 1232  NA 136 135  K 3.4* 3.9  CL 103 103  CO2 24 24  GLUCOSE 280* 130*  BUN 13 13  CREATININE 1.06 1.14  CALCIUM 9.0 9.4  GFRNONAA >60 >60  PROT 7.2 7.5  ALBUMIN  3.8 4.1  AST 23 22  ALT 14 12  ALKPHOS 122 128*  BILITOT 0.9 1.2    RADIOGRAPHIC STUDIES: I have personally reviewed the radiological images as listed and agreed with the findings in the report. CT CHEST ABDOMEN PELVIS W CONTRAST Result Date: 05/20/2024 CLINICAL DATA:  Neuroendocrine tumor of the pancreas. GIST tumor of the stomach. * Tracking Code: BO * EXAM: CT CHEST, ABDOMEN, AND PELVIS WITH CONTRAST TECHNIQUE: Multidetector CT imaging of the chest, abdomen and pelvis was performed following the standard protocol during bolus administration of intravenous contrast. RADIATION DOSE REDUCTION: This exam was performed according to the departmental dose-optimization program which includes automated exposure control, adjustment of the mA and/or kV according to patient size and/or use of iterative reconstruction technique. CONTRAST:  100mL ISOVUE -300 IOPAMIDOL  (ISOVUE -300) INJECTION 61% COMPARISON:  DOTATATE PET scan 03/04/2023 FINDINGS: CT CHEST FINDINGS Cardiovascular: No significant vascular findings. Normal heart size. No pericardial effusion. Mediastinum/Nodes: Multiple small mediastinal lymph nodes are not changed in volume. Nodes non radiotracer avid on comparison DOTATATE PET scan. Cystic lesion in the RIGHT lobe of the thyroid  gland measuring 2.6 cm. No change from prior. Lungs/Pleura: Lobular cluster of nodules in the RIGHT lower lobe measures 12 mm image 84/3. No change from comparison FDG PET scan DOTATATE PET scan. Lesion was not radiotracer avid at that time. More remote scan from  06/27/2020 demonstrates no interval change. Musculoskeletal: No aggressive  osseous lesion. CT ABDOMEN AND PELVIS FINDINGS Hepatobiliary: No focal hepatic lesion. No biliary ductal dilatation. Gallbladder is normal. Common bile duct is normal. Pancreas: No discrete pancreatic lesion. Lymph node adjacent to the head of the pancreas which is radiotracer avid on comparison DOTATATE PET scan is similar 15 mm on image 54/2. Spleen: Spleen is normal. Adrenals/urinary tract: Adrenal glands normal. Several small nonobstructing renal calculi on the RIGHT. Several small subcentimeter cortical cystic lesions. No change from prior Stomach/Bowel: Postsurgical change in the stomach consistent gastric bypass. No obstructing lesion identified. There is a small bowel anastomosis in the central LEFT abdomen. No obstruction. Appendix and cecum normal. The colon and rectosigmoid colon are normal. Vascular/Lymphatic: Abdominal aorta is normal caliber. There is no retroperitoneal or periportal lymphadenopathy. No pelvic lymphadenopathy. Reproductive: Prostate unremarkable Other: Small peritoneal implants previous described along the anterior margin of the spleen are no longer identified. Musculoskeletal: No aggressive osseous lesion. IMPRESSION: CHEST: 1. No evidence of thoracic metastasis. 2. Stable cluster of nodules in the RIGHT lower lobe. 3. Stable cystic lesion in the RIGHT lobe of the thyroid  gland. PELVIS: 1. No evidence of metastatic disease in the abdomen pelvis. 2. Stable lymph node adjacent to the head of the pancreas which was radiotracer avid on comparison DOTATATE PET scan. 3. Postsurgical change in the stomach consistent with gastric bypass. 4. Small bowel anastomosis in the central LEFT abdomen. No obstruction. 5. Small peritoneal implants along the anterior margin of the spleen are no longer identified. 6. No evidence of new or progressive neuroendocrine tumor or GIST tumor. Electronically Signed   By: Jackquline Boxer  M.D.   On: 05/20/2024 13:19     ASSESSMENT & PLAN:   Primary malignant neuroendocrine tumor of stomach [facility]) # JULY 2024- Portion of stomach, subtotal gastrectomy and D2 lymphadenectomy: Well-differentiated neuroendocrine tumor, G1, of the gastric fundus and body, multifocal, up to 2.7 cm. Tumor extent: Tumor invades submucosa. Margins: Negative for tumor (closest = proximal, 0.2 cm). Lymph nodes: Three of forty-four lymph nodes positive for metastatic well-differentiated neuroendocrine tumor (3/44); size of largest metastasis = 0.3 cm; extranodal extension = present; Pathologic stage: pT2(m)N1.D. Periduodenal lymph node, excision:One lymph node positive for metastatic well-differentiated neuroendocrine tumor (1/1). Size of metastasis: 3.5 cm. Extranodal extension: Present.   # AUG 19th, 2025- . Stable cluster of nodules in the RIGHT lower lobe.Stable cystic lesion in the RIGHT lobe of the thyroid  gland; No evidence of metastatic disease in the abdomen pelvis; Stable lymph node adjacent to the head of the pancreas which was radiotracer avid on comparison DOTATATE PET scan; Postsurgical change in the stomach consistent with gastric bypass;  Small bowel anastomosis in the central LEFT abdomen. No obstruction; Small peritoneal implants along the anterior margin of the spleen are no longer identified;  No evidence of new or progressive neuroendocrine tumor or GIST tumor.    #   Given high risk features noted on neuroendocrine of the stomach-will continue surveillance imaging every 6 months or so.  Otherwise no role for any adjuvant therapy   # Mild anemia- [Hx stomach surgery-]- Hx of iron deficicency- # Recommend gentle iron [iron biglycinate; 28 mg ] 1 pill a day.  This pill is unlikely to cause stomach upset or cause constipation.  Recommend B12 sublingual 1000 mcg once a day [under the tongue; helps with better absorption].   # JULY 2024-D2 subtotal gastrectomy [Dr. Loreli; [facility]]- # incidental  gastrointestinal stromal tumor, spindle cell type, G1, low grade, 0.7 cm.Margins: Tumor extends to  the inked and cauterized specimen edge.Pathologic stage: pT1N0.  No role for any adjuvant Gleevec.  # DM- PBF-trying to lose weight; compliance with metformin /and dietary discretion-  stable-    # DISPOSITION: # follow up in 3 months- MD; 2-3 dasy PRIOR- labs-cbc/cmp; iron studies; ferritin; B12; vit D 25-OH possible venofer/ B12 injection-  Dr.B  # I reviewed the blood work- with the patient in detail; also reviewed the imaging independently [as summarized above]; and with the patient in detail.      All questions were answered. The patient knows to call the clinic with any problems, questions or concerns.    Cindy JONELLE Joe, MD 05/20/2024 2:14 PM

## 2024-05-20 NOTE — Assessment & Plan Note (Addendum)
#   JULY 2024- Portion of stomach, subtotal gastrectomy and D2 lymphadenectomy: Well-differentiated neuroendocrine tumor, G1, of the gastric fundus and body, multifocal, up to 2.7 cm. Tumor extent: Tumor invades submucosa. Margins: Negative for tumor (closest = proximal, 0.2 cm). Lymph nodes: Three of forty-four lymph nodes positive for metastatic well-differentiated neuroendocrine tumor (3/44); size of largest metastasis = 0.3 cm; extranodal extension = present; Pathologic stage: pT2(m)N1.D. Periduodenal lymph node, excision:One lymph node positive for metastatic well-differentiated neuroendocrine tumor (1/1). Size of metastasis: 3.5 cm. Extranodal extension: Present.   # AUG 19th, 2025- . Stable cluster of nodules in the RIGHT lower lobe.Stable cystic lesion in the RIGHT lobe of the thyroid  gland; No evidence of metastatic disease in the abdomen pelvis; Stable lymph node adjacent to the head of the pancreas which was radiotracer avid on comparison DOTATATE PET scan; Postsurgical change in the stomach consistent with gastric bypass;  Small bowel anastomosis in the central LEFT abdomen. No obstruction; Small peritoneal implants along the anterior margin of the spleen are no longer identified;  No evidence of new or progressive neuroendocrine tumor or GIST tumor.    #   Given high risk features noted on neuroendocrine of the stomach-will continue surveillance imaging every 6 months or so.  Otherwise no role for any adjuvant therapy   # Mild anemia- [Hx stomach surgery-]- Hx of iron deficicency- # Recommend gentle iron [iron biglycinate; 28 mg ] 1 pill a day.  This pill is unlikely to cause stomach upset or cause constipation.  Recommend B12 sublingual 1000 mcg once a day [under the tongue; helps with better absorption].   # JULY 2024-D2 subtotal gastrectomy [Dr. Loreli; [facility]]- # incidental gastrointestinal stromal tumor, spindle cell type, G1, low grade, 0.7 cm.Margins: Tumor extends to the inked and cauterized  specimen edge.Pathologic stage: pT1N0.  No role for any adjuvant Gleevec.  # DM- PBF-trying to lose weight; compliance with metformin /and dietary discretion-  stable-    # DISPOSITION: # follow up in 3 months- MD; 2-3 dasy PRIOR- labs-cbc/cmp; iron studies; ferritin; B12; vit D 25-OH possible venofer/ B12 injection-  Dr.B  # I reviewed the blood work- with the patient in detail; also reviewed the imaging independently [as summarized above]; and with the patient in detail.

## 2024-05-20 NOTE — Patient Instructions (Signed)
#   Recommend gentle iron [iron biglycinate; 28 mg ] 1 pill a day.  This pill is unlikely to cause stomach upset or cause constipation.  Available Over the counter or talk to pharmacist.   #  Recommend B12 sublingual 1000 mcg once a day [under the tongue; helps with better absorption].

## 2024-08-12 ENCOUNTER — Inpatient Hospital Stay

## 2024-08-15 ENCOUNTER — Inpatient Hospital Stay: Admitting: Nurse Practitioner

## 2024-08-15 ENCOUNTER — Ambulatory Visit

## 2024-08-15 ENCOUNTER — Ambulatory Visit: Admitting: Internal Medicine

## 2024-08-15 ENCOUNTER — Inpatient Hospital Stay

## 2024-08-16 ENCOUNTER — Inpatient Hospital Stay: Attending: Internal Medicine

## 2024-08-16 ENCOUNTER — Inpatient Hospital Stay

## 2024-08-16 ENCOUNTER — Inpatient Hospital Stay: Admitting: Nurse Practitioner

## 2024-08-16 DIAGNOSIS — C7A8 Other malignant neuroendocrine tumors: Secondary | ICD-10-CM

## 2024-08-16 DIAGNOSIS — D649 Anemia, unspecified: Secondary | ICD-10-CM | POA: Diagnosis not present

## 2024-08-16 DIAGNOSIS — Z6833 Body mass index (BMI) 33.0-33.9, adult: Secondary | ICD-10-CM | POA: Insufficient documentation

## 2024-08-16 DIAGNOSIS — C7B01 Secondary carcinoid tumors of distant lymph nodes: Secondary | ICD-10-CM | POA: Insufficient documentation

## 2024-08-16 DIAGNOSIS — C7A092 Malignant carcinoid tumor of the stomach: Secondary | ICD-10-CM | POA: Diagnosis present

## 2024-08-16 LAB — CBC WITH DIFFERENTIAL (CANCER CENTER ONLY)
Abs Immature Granulocytes: 0.01 K/uL (ref 0.00–0.07)
Basophils Absolute: 0 K/uL (ref 0.0–0.1)
Basophils Relative: 0 %
Eosinophils Absolute: 0.1 K/uL (ref 0.0–0.5)
Eosinophils Relative: 2 %
HCT: 37.6 % — ABNORMAL LOW (ref 39.0–52.0)
Hemoglobin: 13.2 g/dL (ref 13.0–17.0)
Immature Granulocytes: 0 %
Lymphocytes Relative: 17 %
Lymphs Abs: 0.9 K/uL (ref 0.7–4.0)
MCH: 25.1 pg — ABNORMAL LOW (ref 26.0–34.0)
MCHC: 35.1 g/dL (ref 30.0–36.0)
MCV: 71.5 fL — ABNORMAL LOW (ref 80.0–100.0)
Monocytes Absolute: 0.5 K/uL (ref 0.1–1.0)
Monocytes Relative: 8 %
Neutro Abs: 4 K/uL (ref 1.7–7.7)
Neutrophils Relative %: 73 %
Platelet Count: 294 K/uL (ref 150–400)
RBC: 5.26 MIL/uL (ref 4.22–5.81)
RDW: 18.2 % — ABNORMAL HIGH (ref 11.5–15.5)
WBC Count: 5.5 K/uL (ref 4.0–10.5)
nRBC: 0 % (ref 0.0–0.2)

## 2024-08-16 LAB — CMP (CANCER CENTER ONLY)
ALT: 10 U/L (ref 0–44)
AST: 18 U/L (ref 15–41)
Albumin: 3.8 g/dL (ref 3.5–5.0)
Alkaline Phosphatase: 109 U/L (ref 38–126)
Anion gap: 8 (ref 5–15)
BUN: 11 mg/dL (ref 8–23)
CO2: 24 mmol/L (ref 22–32)
Calcium: 9.2 mg/dL (ref 8.9–10.3)
Chloride: 106 mmol/L (ref 98–111)
Creatinine: 1.08 mg/dL (ref 0.61–1.24)
GFR, Estimated: >60 mL/min (ref >60)
Glucose, Bld: 244 mg/dL — ABNORMAL HIGH (ref 70–99)
Potassium: 3.9 mmol/L (ref 3.5–5.1)
Sodium: 138 mmol/L (ref 135–145)
Total Bilirubin: 1.5 mg/dL — ABNORMAL HIGH (ref 0.0–1.2)
Total Protein: 7 g/dL (ref 6.5–8.1)

## 2024-08-16 LAB — IRON AND TIBC
Iron: 54 ug/dL (ref 45–182)
Saturation Ratios: 16 % — ABNORMAL LOW (ref 17.9–39.5)
TIBC: 350 ug/dL (ref 250–450)
UIBC: 296 ug/dL

## 2024-08-16 LAB — FERRITIN: Ferritin: 74 ng/mL (ref 24–336)

## 2024-08-16 LAB — VITAMIN D 25 HYDROXY (VIT D DEFICIENCY, FRACTURES): Vit D, 25-Hydroxy: 34.69 ng/mL (ref 30–100)

## 2024-08-16 LAB — VITAMIN B12: Vitamin B-12: 1319 pg/mL — ABNORMAL HIGH (ref 180–914)

## 2024-08-23 ENCOUNTER — Inpatient Hospital Stay

## 2024-08-23 ENCOUNTER — Inpatient Hospital Stay: Attending: Nurse Practitioner | Admitting: Nurse Practitioner

## 2024-08-23 ENCOUNTER — Encounter: Payer: Self-pay | Admitting: Nurse Practitioner

## 2024-08-23 VITALS — BP 129/73 | HR 100 | Temp 97.6°F | Ht 68.0 in | Wt 221.0 lb

## 2024-08-23 DIAGNOSIS — D649 Anemia, unspecified: Secondary | ICD-10-CM | POA: Insufficient documentation

## 2024-08-23 DIAGNOSIS — C7A092 Malignant carcinoid tumor of the stomach: Secondary | ICD-10-CM | POA: Diagnosis present

## 2024-08-23 DIAGNOSIS — C7B01 Secondary carcinoid tumors of distant lymph nodes: Secondary | ICD-10-CM | POA: Insufficient documentation

## 2024-08-23 DIAGNOSIS — C49A4 Gastrointestinal stromal tumor of large intestine: Secondary | ICD-10-CM

## 2024-08-23 DIAGNOSIS — E119 Type 2 diabetes mellitus without complications: Secondary | ICD-10-CM | POA: Diagnosis not present

## 2024-08-24 ENCOUNTER — Encounter: Payer: Self-pay | Admitting: Internal Medicine

## 2024-08-24 ENCOUNTER — Telehealth: Payer: Self-pay | Admitting: Nurse Practitioner

## 2024-08-24 ENCOUNTER — Encounter: Payer: Self-pay | Admitting: Nurse Practitioner

## 2024-08-24 NOTE — Telephone Encounter (Signed)
 Called pt to sched CT - pt confirmed date/time/location - requested appt reminder via mail - LH

## 2024-08-24 NOTE — Progress Notes (Signed)
 [name] CONSULT NOTE  Patient Care Team: [name], [name] LABOR, MD as PCP - General (Internal Medicine) [name], RN as Oncology Nurse Navigator [name], MD as Consulting Physician (Oncology)  CHIEF COMPLAINTS/PURPOSE OF CONSULTATION: lung nodule/mass  # RLL nodule- ~2 cm SUV 1.4 [since 2008-slightly progressed]-benign versus slow-growing adenocarcinoma.  Small pulmonary nodules-  # Peri-Pancreatic mass 2.9 x 1.7 [SUV 6.3]/duodenum-? 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  # Brain Bleed [New Years Eve 2012; on asprin; [facility]- spontaneous/conservative]  Oncology History Overview Note    JULY 18th, 2024-subtotal gastrectomy with D2 dissection;[Dr.Shah; [facility]] histologic Type and Grade:    G1, well-differentiated neuroendocrine tumor     Mitotic Rate:    Less than 2 mitoses per 2 mm2     Ki-67 Labeling Index:    Less than 3%   Tumor Size:    Greatest dimension (Centimeters): 2.7 cm   Tumor Focality:    Multifocal     Number of Tumors:    at least 14   Tumor Extent:    Invades submucosa   Lymphovascular Invasion:    Present   Perineural Invasion:    Not identified   MARGINS   Margin Status:    All margins negative for tumor     Closest Margin(s) to Tumor:    Proximal     Distance from Tumor to Closest Margin:    0.2 cm   REGIONAL LYMPH NODES   Regional Lymph Node Status:        :    Tumor present in regional lymph node(s)       Number of Lymph Nodes with Tumor:    4     Number of Lymph Nodes Examined:    48   # 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 [name] he is no S/p laparoscopic subtotal gastrectomy 04/09/23. 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 Pt had several of the lesions with the biggest being 3-4 cm. Ordered Ga-Dotatate scan.  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 Hemoglobin improved current 13. Stable.  - MAY 8th, 2024- MRI- Unchanged lobulated fluid signal cystic lesion of the central pancreatic head measuring 1.4 x 1.2 cm. This is consistent with a side branch IPMN, and given well-established long-term stability, no further follow-up or characterization is required - Incidental thyroid  nodule- 3.2 cm right thyroid  nodule, stable since 05/23/2020; ultrasound March 2023-no further follow-up recommended. Left thyroid  nodule-1 cm in size recommend annual ultrasound for total of 5 years. [2028]- MAY 2024- Thyroid  US - Stable size of 1.1 cm left inferior thyroid  nodule (nodule 2) which shows slight change in some characteristics but still falls under same categorization for 1 year follow-up ultrasound. - RLL nodule ~approximately 2 cm; SUV 1.8- slightly progressed over 13 years- benign etiology or very slowly growing adenocarcinoma. Repeat imaging-March 2022-stable unchanged. Radiologically considered benign. No further follow up is recommended- stable.    # Incidental thyroid  nodule- 3.2 cm right thyroid  nodule, stable since 05/23/2020; ultrasound March  2023-no further follow-up recommended. However, FEB 2025-[facility]- CT scan- increase in size of the thyroid  cystic nodule.  Left thyroid  nodule-1 cm MARCH 2025- Thyroid  US - STABLE-  S/p evaluation- [name]- re: thyoid nodule-  no surveillance needed.    Primary malignant neuroendocrine tumor of stomach (HCC)  02/20/2023 Initial Diagnosis   Primary malignant neuroendocrine tumor of stomach (HCC)   04/20/2023 Cancer Staging    Staging form: Neuroendocrine Tumor - Stomach, AJCC V9 - Clinical: Stage III (cT2, cN1, cM0) - Signed by [name], MD on 04/20/2023     HISTORY OF PRESENTING ILLNESS: Patient ambulating-independently.  Alone.   Jesus Bell 68 y.o.  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  In the interim underwent evaluation with endoscopic ultrasound at [facility] obvious evidence of recurrence.  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 H. Pylori organisms are not identified on Warthin-starry stain. No evidence of atrophic gastritis or malignancy    Overall clinically doing well. Denies any blood in stools or black or stools.  Appetite is good.  No weight loss.  No fatigue. Denies any diarrhea or skin rash or flushing.   Denies any worsening shortness of breath or cough denies abdominal pain pain or nausea vomiting.   Review of Systems  Constitutional:  Negative for chills, diaphoresis, fever, malaise/fatigue and weight loss.  HENT:  Negative for nosebleeds and sore throat.   Eyes:  Negative for double vision.  Respiratory:  Negative for cough, hemoptysis, sputum production, shortness of breath and wheezing.   Cardiovascular:  Negative for chest pain, palpitations, orthopnea and leg swelling.  Gastrointestinal:  Negative for abdominal pain, blood in stool, constipation, diarrhea, heartburn, melena, nausea and vomiting.  Genitourinary:  Negative for dysuria, frequency and urgency.  Musculoskeletal:  Negative for back pain and joint pain.  Skin: Negative.  Negative for itching and rash.  Neurological:  Negative for dizziness, tingling, focal weakness, weakness and headaches.  Endo/Heme/Allergies:   Does not bruise/bleed easily.  Psychiatric/Behavioral:  Negative for depression. The patient is not nervous/anxious and does not have insomnia.      MEDICAL HISTORY:  Past Medical History:  Diagnosis Date   Chicken pox    Diabetes mellitus type 2, uncomplicated (HCC)    Diabetes mellitus without complication (HCC)    boarderline   Erectile dysfunction    GERD (gastroesophageal reflux disease)    History of kidney stones    Hypercholesterolemia    Hypertension    Stroke [facility]) 2012   hemorrhagic vessel    SURGICAL HISTORY: Past Surgical History:  Procedure Laterality Date   CYSTOSCOPY W/ RETROGRADES  05/21/2020   Procedure: CYSTOSCOPY WITH RETROGRADE PYELOGRAM;  Surgeon: Penne Knee, MD;  Location: [facility] ORS;  Service: Urology;;   CYSTOSCOPY W/ URETERAL STENT PLACEMENT Left 07/09/2020   Procedure: CYSTOSCOPY WITH STENT REPLACEMENT;  Surgeon: Penne Knee, MD;  Location: [facility] ORS;  Service: Urology;  Laterality: Left;   CYSTOSCOPY WITH BIOPSY N/A 06/04/2020   Procedure: CYSTOSCOPY WITH bladder neck BIOPSY;  Surgeon: Penne Knee, MD;  Location: [facility] ORS;  Service: Urology;  Laterality: N/A;   CYSTOSCOPY/RETROGRADE/URETEROSCOPY Left 07/09/2020   Procedure: CYSTOSCOPY/RETROGRADE/URETEROSCOPY;  Surgeon: Penne Knee, MD;  Location: [facility] ORS;  Service: Urology;  Laterality: Left;   CYSTOSCOPY/URETEROSCOPY/HOLMIUM LASER/STENT PLACEMENT Left 05/21/2020  Procedure: CYSTOSCOPY/URETEROSCOPY/STENT PLACEMENT;  Surgeon: Penne Knee, MD;  Location: [facility] ORS;  Service: Urology;  Laterality: Left;   CYSTOSCOPY/URETEROSCOPY/HOLMIUM LASER/STENT PLACEMENT Left 06/04/2020   Procedure: CYSTOSCOPY/URETEROSCOPY/HOLMIUM LASER/STENT Exchange;  Surgeon: Penne Knee, MD;  Location: [facility] OR[facility];  Service: Urology;  Laterality: Left;   SHOULDER SURGERY Bilateral    clavicle   TOTAL HIP ARTHROPLASTY Left 08/17/2017   Procedure: TOTAL HIP ARTHROPLASTY;  Surgeon: Mardee Lynwood SQUIBB, MD;  Location:  [facility] ORS;  Service: Orthopedics;  Laterality: Left;   TOTAL HIP REVISION Left 11/09/2020   Procedure: TOTAL HIP REVISION;  Surgeon: Mardee Lynwood SQUIBB, MD;  Location: [facility] OR[facility];  Service: Orthopedics;  Laterality: Left;   WRIST FUSION Left     SOCIAL HISTORY: Social History   Socioeconomic History   Marital status: Married    Spouse name: Merlynn   Number of children: 4   Years of education: 12   Highest education level: High school graduate  Occupational History   Not on file  Tobacco Use   Smoking status: Never   Smokeless tobacco: Never  Vaping Use   Vaping status: Never Used  Substance and Sexual Activity   Alcohol use: No   Drug use: No   Sexual activity: Not on file  Other Topics Concern   Not on file  Social History Narrative   No smoking no alcohol; used to work in consulting civil engineer; 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 [place] With home. 4 children.    Social Drivers of Corporate Investment Banker Strain: Low Risk  (04/17/2023)   Received from [facility]   Overall Financial Resource Strain (CARDIA)    Difficulty of Paying Living Expenses: Not hard at all  Food Insecurity: No Food Insecurity (04/17/2023)   Received from [facility]   Hunger Vital Sign    Within the past 12 months, the food you bought just didn't last and you didn't have money to get more.: Never true    Within the past 12 months, you worried that your food would run out before you got the money to buy more.: Never true  Transportation Needs: No Transportation Needs (04/17/2023)   Received from [facility]   PRAPARE - Transportation    Lack of Transportation (Non-Medical): No    In the past 12 months, has lack of transportation kept you from medical appointments or from getting medications?: No  Physical Activity: Not on file  Stress: Not on file  Social Connections: Not on file  Intimate Partner Violence: Not At Risk (02/08/2024)    Humiliation, Afraid, Rape, and Kick questionnaire    Fear of Current or Ex-Partner: No    Emotionally Abused: No    Physically Abused: No    Sexually Abused: No    FAMILY HISTORY: Family History  Adopted: Yes  Family history unknown: Yes    ALLERGIES:  is allergic to nsaids and aspirin.  MEDICATIONS:  Current Outpatient Medications  Medication Sig Dispense Refill   acetaminophen  (TYLENOL ) 325 MG tablet Take 325-650 mg by mouth every 4 (four) hours as needed for mild pain or headache.     amLODipine  (NORVASC ) 10 MG tablet Take 10 mg by mouth daily with breakfast.      clobetasol ointment (TEMOVATE) 0.05 % 1 Application.     Cyanocobalamin (VITAMIN B-12 PO) Take 1 tablet by mouth daily at 6 (six) AM.     Ferrous Sulfate  (IRON PO) Take 1 tablet by mouth daily.  lovastatin (MEVACOR) 40 MG tablet Take 40 mg by mouth daily.      metFORMIN  (GLUCOPHAGE ) 1000 MG tablet Take 1,000 mg by mouth 2 (two) times daily.     omeprazole (PRILOSEC) 40 MG capsule Take 40 mg by mouth daily.      sildenafil (VIAGRA) 100 MG tablet Take 100 mg by mouth as needed for erectile dysfunction.      No current facility-administered medications for this visit.      SABRA  PHYSICAL EXAMINATION: ECOG PERFORMANCE STATUS: 0 - Asymptomatic  Vitals:   08/23/24 1252  BP: 129/73  Pulse: 100  Temp: 97.6 F (36.4 C)  SpO2: 95%     Filed Weights   08/23/24 1252  Weight: 221 lb (100.2 kg)      Physical Exam Constitutional:      Comments: Walking with a cane [recent hip surgery]; alone.  HENT:     Head: Normocephalic and atraumatic.     Mouth/Throat:     Pharynx: No oropharyngeal exudate.  Eyes:     Pupils: Pupils are equal, round, and reactive to light.  Cardiovascular:     Rate and Rhythm: Normal rate and regular rhythm.  Pulmonary:     Effort: Pulmonary effort is normal. No respiratory distress.     Breath sounds: Normal breath sounds. No wheezing.  Abdominal:     General: Bowel sounds are  normal. There is no distension.     Palpations: Abdomen is soft. There is no mass.     Tenderness: There is no abdominal tenderness. There is no guarding or rebound.  Musculoskeletal:        General: No tenderness. Normal range of motion.     Cervical back: Normal range of motion and neck supple.  Skin:    General: Skin is warm.  Neurological:     Mental Status: He is alert and oriented to person, place, and time.  Psychiatric:        Mood and Affect: Affect normal.      LABORATORY DATA:  I have reviewed the data as listed Lab Results  Component Value Date   WBC 5.5 08/16/2024   HGB 13.2 08/16/2024   HCT 37.6 (L) 08/16/2024   MCV 71.5 (L) 08/16/2024   PLT 294 08/16/2024   Recent Labs    12/08/23 0832 05/20/24 1232 08/16/24 1052  NA 136 135 138  K 3.4* 3.9 3.9  CL 103 103 106  CO2 24 24 24   GLUCOSE 280* 130* 244*  BUN 13 13 11   CREATININE 1.06 1.14 1.08  CALCIUM 9.0 9.4 9.2  GFRNONAA >60 >60 >60  PROT 7.2 7.5 7.0  ALBUMIN  3.8 4.1 3.8  AST 23 22 18   ALT 14 12 10   ALKPHOS 122 128* 109  BILITOT 0.9 1.2 1.5*    RADIOGRAPHIC STUDIES: I have personally reviewed the radiological images as listed and agreed with the findings in the report. No results found.    ASSESSMENT & PLAN:      Primary malignant neuroendocrine tumor of stomach [facility]) # JULY 2024- Portion of stomach, subtotal gastrectomy and D2 lymphadenectomy: Well-differentiated neuroendocrine tumor, G1, of the gastric fundus and body, multifocal, up to 2.7 cm. Tumor extent: Tumor invades submucosa. Margins: Negative for tumor (closest = proximal, 0.2 cm). Lymph nodes: Three of forty-four lymph nodes positive for metastatic well-differentiated neuroendocrine tumor (3/44); size of largest metastasis = 0.3 cm; extranodal extension = present; Pathologic stage: pT2(m)N1.D. Periduodenal lymph node, excision:One lymph node positive for metastatic well-differentiated neuroendocrine  tumor (1/1). Size of metastasis: 3.5  cm. Extranodal extension: Present.    # AUG 19th, 2025- . Stable cluster of nodules in the RIGHT lower lobe.Stable cystic lesion in the RIGHT lobe of the thyroid  gland; No evidence of metastatic disease in the abdomen pelvis; Stable lymph node adjacent to the head of the pancreas which was radiotracer avid on comparison DOTATATE PET scan; Postsurgical change in the stomach consistent with gastric bypass;  Small bowel anastomosis in the central LEFT abdomen. No obstruction; Small peritoneal implants along the anterior margin of the spleen are no longer identified;  No evidence of new or progressive neuroendocrine tumor or GIST tumor.   Ordered surveillance CT CAP for feb   #   Given high risk features noted on neuroendocrine of the stomach-will continue surveillance imaging every 6 months or so.  Otherwise no role for any adjuvant therapy CT CAP ordered this visit for Feb 2026   # Mild anemia- [Hx stomach surgery-]- Hx of iron deficicency- # Recommend gentle iron [iron biglycinate; 28 mg ] 1 pill a day.  This pill is unlikely to cause stomach upset or cause constipation.  Recommend B12 sublingual 1000 mcg once a day [under the tongue; helps with better absorption]. Stable no need for venofer Hg 13.2    # JULY 2024-D2 subtotal gastrectomy [Dr. Loreli; [facility]]- # incidental gastrointestinal stromal tumor, spindle cell type, G1, low grade, 0.7 cm.Margins: Tumor extends to the inked and cauterized specimen edge.Pathologic stage: pT1N0.  No role for any adjuvant Gleevec.   # DM- PBF-trying to lose weight; compliance with metformin /and dietary discretion-  stable-    Follow up plan: Surveillance CT ordered for Feb No venofer/No vitamin b12 today follow up in 3 months- MD,CT scan results; 2-3 dasy PRIOR- labs-cbc/cmp; iron studies; ferritin; B12; vit D 25-OH possible venofer/ B12 injection- LP   All questions were answered. The patient knows to call the clinic with any problems, questions or concerns.     Morna Husband, NP 08/24/2024 11:59 AM

## 2024-11-14 ENCOUNTER — Ambulatory Visit

## 2024-11-28 ENCOUNTER — Inpatient Hospital Stay

## 2024-11-30 ENCOUNTER — Inpatient Hospital Stay: Admitting: Internal Medicine

## 2024-11-30 ENCOUNTER — Inpatient Hospital Stay
